# Patient Record
Sex: Female | Born: 1935
Health system: Southern US, Community
[De-identification: ages and names within clinical notes are randomized; demographics above are authoritative.]

## PROBLEM LIST (undated history)

## (undated) DIAGNOSIS — E785 Hyperlipidemia, unspecified: Secondary | ICD-10-CM

## (undated) DIAGNOSIS — I480 Paroxysmal atrial fibrillation: Secondary | ICD-10-CM

## (undated) DIAGNOSIS — Z7901 Long term (current) use of anticoagulants: Secondary | ICD-10-CM

## (undated) DIAGNOSIS — I779 Disorder of arteries and arterioles, unspecified: Secondary | ICD-10-CM

## (undated) DIAGNOSIS — I38 Endocarditis, valve unspecified: Secondary | ICD-10-CM

## (undated) DIAGNOSIS — Z8679 Personal history of other diseases of the circulatory system: Secondary | ICD-10-CM

## (undated) DIAGNOSIS — I10 Essential (primary) hypertension: Secondary | ICD-10-CM

## (undated) DIAGNOSIS — I739 Peripheral vascular disease, unspecified: Secondary | ICD-10-CM

## (undated) HISTORY — DX: Hyperlipidemia, unspecified: E78.5

## (undated) HISTORY — DX: Long term (current) use of anticoagulants: Z79.01

## (undated) HISTORY — DX: Paroxysmal atrial fibrillation: I48.0

## (undated) HISTORY — DX: Essential (primary) hypertension: I10

## (undated) HISTORY — DX: Personal history of other diseases of the circulatory system: Z86.79

## (undated) HISTORY — PX: OTHER SURGICAL HISTORY: SHX169

## (undated) HISTORY — DX: Peripheral vascular disease, unspecified: I73.9

## (undated) HISTORY — DX: Disorder of arteries and arterioles, unspecified: I77.9

## (undated) HISTORY — DX: Endocarditis, valve unspecified: I38

---

## 2002-09-20 ENCOUNTER — Other Ambulatory Visit: Admission: RE | Admit: 2002-09-20 | Discharge: 2002-09-20 | Payer: Self-pay | Admitting: Internal Medicine

## 2005-10-08 ENCOUNTER — Encounter: Payer: Self-pay | Admitting: Vascular Surgery

## 2005-10-08 ENCOUNTER — Ambulatory Visit (HOSPITAL_COMMUNITY): Admission: RE | Admit: 2005-10-08 | Discharge: 2005-10-08 | Payer: Self-pay | Admitting: Internal Medicine

## 2006-10-16 ENCOUNTER — Ambulatory Visit: Payer: Self-pay | Admitting: *Deleted

## 2006-10-16 ENCOUNTER — Ambulatory Visit (HOSPITAL_COMMUNITY): Admission: RE | Admit: 2006-10-16 | Discharge: 2006-10-16 | Payer: Self-pay | Admitting: Internal Medicine

## 2009-03-23 ENCOUNTER — Ambulatory Visit: Payer: Self-pay | Admitting: Vascular Surgery

## 2009-03-23 ENCOUNTER — Encounter: Payer: Self-pay | Admitting: Cardiology

## 2009-03-23 ENCOUNTER — Ambulatory Visit (HOSPITAL_COMMUNITY): Admission: RE | Admit: 2009-03-23 | Discharge: 2009-03-23 | Payer: Self-pay | Admitting: Cardiology

## 2010-02-20 ENCOUNTER — Ambulatory Visit: Payer: Self-pay | Admitting: Cardiology

## 2010-09-07 ENCOUNTER — Telehealth: Payer: Self-pay | Admitting: Cardiology

## 2010-09-07 NOTE — Telephone Encounter (Signed)
Received Auth to Release Med Rec Info today and request for Last OV and Last Carotid ULS,  Faxed over last OV however no ULS of Carotid done at [facility] but at [facility], added this info to fax cover sheet to let [facility] be aware, confirmation received

## 2011-03-22 ENCOUNTER — Encounter: Payer: Self-pay | Admitting: *Deleted

## 2011-03-22 DIAGNOSIS — I999 Unspecified disorder of circulatory system: Secondary | ICD-10-CM | POA: Insufficient documentation

## 2011-03-26 ENCOUNTER — Encounter: Payer: Self-pay | Admitting: Cardiology

## 2011-03-26 ENCOUNTER — Ambulatory Visit (INDEPENDENT_AMBULATORY_CARE_PROVIDER_SITE_OTHER): Payer: Medicare Other | Admitting: Cardiology

## 2011-03-26 VITALS — BP 92/62 | HR 60 | Ht 62.0 in | Wt 135.0 lb

## 2011-03-26 DIAGNOSIS — B0229 Other postherpetic nervous system involvement: Secondary | ICD-10-CM

## 2011-03-26 DIAGNOSIS — I779 Disorder of arteries and arterioles, unspecified: Secondary | ICD-10-CM

## 2011-03-26 DIAGNOSIS — E7800 Pure hypercholesterolemia, unspecified: Secondary | ICD-10-CM

## 2011-03-26 DIAGNOSIS — I999 Unspecified disorder of circulatory system: Secondary | ICD-10-CM

## 2011-03-26 DIAGNOSIS — I2510 Atherosclerotic heart disease of native coronary artery without angina pectoris: Secondary | ICD-10-CM

## 2011-03-26 NOTE — Assessment & Plan Note (Signed)
The patient had carotid Dopplers in February 2011 which showed moderate but not severe artery stenosis.  The patient denies any symptoms of TIA.

## 2011-03-26 NOTE — Progress Notes (Signed)
Mary Knapp Date of Birth:  10/27/1926 [facility] [address] [place], [place]  [address] [phone]  Fax   [phone]  HPI: This pleasant 78 year old woman is seen for a one-year followup office visit.  He has been feeling well.  She denies any cardiac symptoms.  She denies any chest pain or shortness of breath.  She does have known moderate carotid artery disease but has had no symptoms.  Current Outpatient Prescriptions  Medication Sig Dispense Refill  . aspirin 81 MG tablet Take 81 mg by mouth daily.      . Calcium Carbonate-Vitamin D (CALCIUM + D PO) Take by mouth 2 (two) times daily.      . Cholecalciferol (VITAMIN D PO) Take by mouth.      . Coenzyme Q10 (CO Q 10 PO) Take by mouth daily.      . Multiple Vitamin (MULTIVITAMIN) tablet Take 1 tablet by mouth daily.      . Nutritional Supplements (MELATONIN PO) Take 3 mg by mouth. Takes 1/2 at HS      . Omega-3 Fatty Acids (FISH OIL PO) Take by mouth daily.      . benazepril-hydrochlorthiazide (LOTENSIN HCT) 10-12.5 MG per tablet       . CRESTOR 10 MG tablet         No Known Allergies  Patient Active Problem List  Diagnoses  . Vascular disease  . Carotid artery disease    History  Smoking status  . Former Smoker  . Types: Cigarettes  Smokeless tobacco  . Not on file    History  Alcohol Use     Family History  Problem Relation Age of Onset  . Heart disease Father     Review of Systems: The patient denies any heat or cold intolerance.  No weight gain or weight loss.  The patient denies headaches or blurry vision.  There is no cough or sputum production.  The patient denies dizziness.  There is no hematuria or hematochezia.  The patient denies any muscle aches or arthritis.  The patient denies any rash.  The patient denies frequent falling or instability.  There is no history of depression or anxiety.  All other systems were reviewed and are negative.   Physical Exam: Filed Vitals:     03/26/11 1422  BP: 92/62  Pulse: 60   on examination this is a well-developed well-nourished elderly woman in no distress.Pupils equal and reactive.   Extraocular Movements are full.  There is no scleral icterus.  The mouth and pharynx are normal.  The neck is supple.  The carotids reveal no bruits.  The jugular venous pressure is normal.  The thyroid is not enlarged.  There is no lymphadenopathy.  The chest is clear to percussion and auscultation. There are no rales or rhonchi. Expansion of the chest is symmetrical.  The precordium is quiet.  The first heart sound is normal.  The second heart sound is physiologically split.  There is no murmur gallop rub or click.  There is no abnormal lift or heave.  LabThe abdomen is soft and nontender. Bowel sounds are normal. The liver and spleen are not enlarged. There Are no abdominal masses. There are no bruits.  The pedal pulses are good.  There is no phlebitis or edema.  There is no cyanosis or clubbing.  EKG shows normal sinus rhythm and is within normal limits.    Assessment / Plan: The patient is doing well on current therapy.  Continue same medications and be rechecked in one year.

## 2011-03-26 NOTE — Assessment & Plan Note (Signed)
The patient has a history of having had a normal nuclear stress test in 2007 in our office.  There was no ischemia and her ejection fraction was 59%.

## 2012-03-23 ENCOUNTER — Encounter: Payer: Self-pay | Admitting: Cardiology

## 2012-11-18 ENCOUNTER — Ambulatory Visit (INDEPENDENT_AMBULATORY_CARE_PROVIDER_SITE_OTHER): Payer: Medicare Other | Admitting: Nurse Practitioner

## 2012-11-18 ENCOUNTER — Encounter: Payer: Self-pay | Admitting: Internal Medicine

## 2012-11-18 VITALS — BP 143/80 | HR 71 | Resp 16

## 2012-11-18 DIAGNOSIS — R0789 Other chest pain: Secondary | ICD-10-CM

## 2012-11-18 NOTE — Progress Notes (Signed)
Patient presents as a walk-in with c/o chest pressure and low heart rate.  I retrieved patient from the lobby; patient ambulates without difficulty, is in no acute distress, skin warm, dry, acyanotic.  Patient speaks clearly in complete sentences and is alert and oriented to person, place, time.  Patient was taken to an exam room where vital signs and a 12 lead EKG was obtained.  Patient states that she had chest pressure on Thursday, Friday, and Saturday and that it is gone today.  She states that what was most concerning to her was that her heart rate was in the 50's and it is usually in the 70's.  EKG was taken to Dr. Johney Frame, DOD who states patient is having PACs and this is likely the cause of her chest pressure.  He advised that if she is not in distress today to make an appointment with Dr. Patty Sermons to be seen within the next week.  Patient is scheduled for Monday 10/27 at 0845 with Dr. Patty Sermons.  I advised patient that her pulse is actually higher than 50 but is difficult to obtain due to the PACs; patient verbalized understanding.  I advised patient to call 911 if she experiences chest pressure with dizziness, SOB, nausea, and/or diaphoresis and/or she cannot do her regular daily activities.  Patient verbalized understanding and called her daughter who is a Engineer, civil (consulting) who lives in [place] so that I could tell her daughter the same instructions.  Daughter verbalized gratitude.  Patient was escorted to the lobby for discharge in no acute distress.

## 2012-11-23 ENCOUNTER — Telehealth: Payer: Self-pay | Admitting: Cardiology

## 2012-11-23 ENCOUNTER — Ambulatory Visit (INDEPENDENT_AMBULATORY_CARE_PROVIDER_SITE_OTHER): Payer: Medicare Other | Admitting: Cardiology

## 2012-11-23 ENCOUNTER — Encounter: Payer: Self-pay | Admitting: Cardiology

## 2012-11-23 VITALS — BP 120/70 | HR 120 | Ht 61.0 in | Wt 134.0 lb

## 2012-11-23 DIAGNOSIS — E7800 Pure hypercholesterolemia, unspecified: Secondary | ICD-10-CM

## 2012-11-23 DIAGNOSIS — I4892 Unspecified atrial flutter: Secondary | ICD-10-CM

## 2012-11-23 DIAGNOSIS — I779 Disorder of arteries and arterioles, unspecified: Secondary | ICD-10-CM

## 2012-11-23 LAB — CBC WITH DIFFERENTIAL/PLATELET
Basophils Relative: 0.7 % (ref 0.0–3.0)
Eosinophils Absolute: 0.1 10*3/uL (ref 0.0–0.7)
Eosinophils Relative: 1.8 % (ref 0.0–5.0)
HCT: 40.8 % (ref 36.0–46.0)
Lymphs Abs: 1.7 10*3/uL (ref 0.7–4.0)
MCHC: 33.7 g/dL (ref 30.0–36.0)
MCV: 91.6 fl (ref 78.0–100.0)
Monocytes Absolute: 0.9 10*3/uL (ref 0.1–1.0)
Neutro Abs: 3.6 10*3/uL (ref 1.4–7.7)
Neutrophils Relative %: 57 % (ref 43.0–77.0)
RBC: 4.46 Mil/uL (ref 3.87–5.11)
WBC: 6.2 10*3/uL (ref 4.5–10.5)

## 2012-11-23 LAB — BASIC METABOLIC PANEL
CO2: 32 mEq/L (ref 19–32)
Chloride: 95 mEq/L — ABNORMAL LOW (ref 96–112)
Creatinine, Ser: 0.9 mg/dL (ref 0.4–1.2)
Potassium: 4.1 mEq/L (ref 3.5–5.1)

## 2012-11-23 LAB — T4, FREE: Free T4: 0.95 ng/dL (ref 0.60–1.60)

## 2012-11-23 MED ORDER — METOPROLOL SUCCINATE ER 25 MG PO TB24: 25.0000 mg | ORAL_TABLET | Freq: Every day | ORAL | Status: DC

## 2012-11-23 MED ORDER — RIVAROXABAN 20 MG PO TABS: 20.0000 mg | ORAL_TABLET | Freq: Every day | ORAL | Status: DC

## 2012-11-23 NOTE — Assessment & Plan Note (Signed)
The patient had carotid Dopplers in February 2011 which showed moderate but not severe carotid artery stenosis.  She has not had any TIA symptoms.

## 2012-11-23 NOTE — Progress Notes (Signed)
Quick Note:  Please report to patient. The recent labs are stable. Continue same medication and careful diet. Kidney function normal. No anemia. Thyroid normal. ______

## 2012-11-23 NOTE — Assessment & Plan Note (Signed)
EKG today shows heart rate of 120 and she has atrial flutter with 2:1 block on EKG.  She has not had any thromboembolic episodes.  She has a chance has score of 4, for age, sex, and vascular disease (carotid artery stenosis).  We will start her on Xarelto.  We will obtain a two-dimensional echocardiogram.

## 2012-11-23 NOTE — Telephone Encounter (Signed)
Follow up    [facility] w/[employer] called back about pre auth

## 2012-11-23 NOTE — Patient Instructions (Signed)
Will obtain labs today and call you with the results (bmet,t72f,tsh,cbc)  STOP ASPIRIN  START XARELTO 20 MG DAILY  START TOPROL XL 25 MG DAILY  Your physician has requested that you have an echocardiogram. Echocardiography is a painless test that uses sound waves to create images of your heart. It provides your doctor with information about the size and shape of your heart and how well your heart's chambers and valves are working. This procedure takes approximately one hour. There are no restrictions for this procedure.  Your physician recommends that you schedule a follow-up appointment in: 1 WEEK OV/EKG  Atrial Flutter Atrial flutter is a heart rhythm that can cause the heart to beat very fast (tachycardia). It originates in the upper chambers of the heart (atria). In atrial flutter, the top chambers of the heart (atria) often beat much faster than the bottom chambers of the heart (ventricles). Atrial flutter has a regular "saw toothed" appearance in an EKG readout. An EKG is a test that records the electrical activity of the heart. Atrial flutter can cause the heart to beat up to 150 beats per minute (BPM). Atrial flutter can either be short lived (paroxysmal) or permanent.  CAUSES  Causes of atrial flutter can be many. Some of these include:  Heart related issues:  Heart attack (myocardial infarction).  Heart failure.  Heart valve problems.  Poorly controlled high blood pressure (hypertension).  Afteropen heart surgery.  Lung related issues:  A blood clot in the lungs (pulmonary embolism).  Chronic obstructive pulmonary disease (COPD). Medications used to treat COPD can attribute to atrial flutter.  Other related causes:  Hyperthyroidism.  Caffeine.  Some decongestant cold medications.  Low electrolyte levels such as potassium or magnesium.  Cocaine. SYMPTOMS  An awareness of your heart beating rapidly (palpitations).  Shortness of breath.  Chest pain.  Low  blood pressure (hypotension).  Dizziness or fainting. DIAGNOSIS  Different tests can be performed to diagnose atrial flutter.   An EKG.  Holter monitor. This is a 24 hour recording of your heart rhythm. You will also be given a diary. Write down all symptoms that you have and what you were doing at the time you experienced symptoms.  Cardiac event monitor. This small device can be worn for up to 30 days. When you have heart symptoms, you will push a button on the device. This will then record your heart rhythm.  Echocardiogram. This is an imaging test to look at your heart. Your caregiver will look at your heart valves and the ventricles.  Stress Test. This test can help determine if the atrial flutter is related to exercise or if coronary artery disease is present.  Laboratory studies will look at certain blood levels like:  Complete blood count (CBC).  Potassium.  Magnesium.  Thyroid function. TREATMENT  Treatment of atrial flutter varies. A combination of therapies may be used or sometimes atrial flutter may need only 1 type of treatment.  Lab work: If your blood work, such as your electrolytes (potassium, magnesium) or your thyroid function tests are abnormal, your caregiver will treat them accordingly.  Medication:  There are several different types of medications that can convert your heart to a normal rhythm and prevent atrial flutter from reoccurring.  Nonsurgical procedures: Nonsurgical techniques may be used to control atrial flutter. Some examples include:  Cardioversion. This technique uses either drugs or an electrical shock to restore a normal heart rhythm:  Cardioversion drugs may be given through an intravenous (IV) line  to help "reset" the heart rhythm.  In electrical cardioversion, your caregiver shocks your heart with electrical energy. This helps to reset the heartbeat to a normal rhythm.  Ablation. If atrial flutter is a persistent problem, an ablation may  be needed. This procedure is done under mild sedation. High frequency radio-wave energy is used to destroy the area of heart tissue responsible for atrial flutter. SEEK IMMEDIATE MEDICAL CARE IF:   Dizziness.  Near fainting or fainting.  Shortness of breath.  Chest pain or pressure.  Sudden nausea or vomiting.  Profuse sweating. If you have the above symptoms, call your local emergency service immediately! Do not drive yourself to the hospital. MAKE SURE YOU:   Understand these instructions.  Will watch your condition.  Will get help right away if you are not doing well or get worse. Document Released: 06/02/2008 Document Revised: 04/08/2011 Document Reviewed: 06/02/2008 [facility] Patient Information 2014 [place], [place].

## 2012-11-23 NOTE — Progress Notes (Signed)
Mary Knapp Date of Birth:  17-Oct-1926 [address] [place], [place]  [address] [phone]  Fax   [phone]  HPI: This pleasant 79 year old woman is seen for a  Work in visit after a long absence.  She has been feeling well.  Last week she came in as a walk-in and was checked by the nurse.  Her EKG at that time showed normal sinus rhythm with occasional PVC .her blood pressure monitor at home had been reading a slow pulse which was found to be secondary to PVCs not being picked up by the machine.  She one week ago she had some chest discomfort characterized as pressure one day and has had none since.  She has not been aware of her heart racing or beating fast.  She has had some malaise and fatigue.  She does not have any history of ischemic heart disease.  She had a normal Myoview stress test in 2007 with no ischemia and her ejection fraction was 59%.  She denies any chest pain or shortness of breath.  She does have known moderate carotid artery disease but has had no symptoms.  She does not have any history of GI bleeding.  She had a annual physical with Dr. Valentina Lucks about 4 weeks ago and states that everything checked out fine at that time.  She has not been having any symptoms of weight loss or thyrotoxicosis.  She drinks only one cup of coffee a day.  She does not drink alcohol.  She is a nonsmoker.  Current Outpatient Prescriptions  Medication Sig Dispense Refill  . benazepril-hydrochlorthiazide (LOTENSIN HCT) 10-12.5 MG per tablet       . Calcium Carbonate-Vitamin D (CALCIUM + D PO) Take by mouth 2 (two) times daily.      . Cholecalciferol (VITAMIN D PO) Take by mouth.      . Coenzyme Q10 (CO Q 10 PO) Take by mouth daily.      . CRESTOR 10 MG tablet       . Multiple Vitamin (MULTIVITAMIN) tablet Take 1 tablet by mouth daily.      . Nutritional Supplements (MELATONIN PO) Take 3 mg by mouth. Takes 1/2 at HS      . Omega-3 Fatty Acids (FISH OIL PO) Take by mouth daily.       . metoprolol succinate (TOPROL XL) 25 MG 24 hr tablet Take 1 tablet (25 mg total) by mouth daily.  30 tablet  5  . Rivaroxaban (XARELTO) 20 MG TABS tablet Take 1 tablet (20 mg total) by mouth daily.  30 tablet  3   No current facility-administered medications for this visit.    No Known Allergies  Patient Active Problem List   Diagnosis Date Noted  . Atrial flutter 11/23/2012  . Vascular disease   . Carotid artery disease     History  Smoking status  . Former Smoker  . Types: Cigarettes  Smokeless tobacco  . Not on file    History  Alcohol Use     Family History  Problem Relation Age of Onset  . Heart disease Father     Review of Systems: The patient denies any heat or cold intolerance.  No weight gain or weight loss.  The patient denies headaches or blurry vision.  There is no cough or sputum production.  The patient denies dizziness.  There is no hematuria or hematochezia.  The patient denies any muscle aches or arthritis.  The patient denies  any rash.  The patient denies frequent falling or instability.  There is no history of depression or anxiety.  All other systems were reviewed and are negative.   Physical Exam: Filed Vitals:   11/23/12 0854  BP: 120/70  Pulse: 120   on examination this is a well-developed well-nourished elderly woman in no distress.Pupils equal and reactive.   Extraocular Movements are full.  There is no scleral icterus.  The mouth and pharynx are normal.  The neck is supple.  The carotids reveal no bruits.  The jugular venous pressure is normal.  The thyroid is not enlarged.  There is no lymphadenopathy.  The chest is clear to percussion and auscultation. There are no rales or rhonchi. Expansion of the chest is symmetrical.  The precordium is quiet.  The first heart sound is normal.  The second heart sound is physiologically split.  There is no murmur gallop rub or click.  There is no abnormal lift or heave.  LabThe abdomen is soft and  nontender. Bowel sounds are normal. The liver and spleen are not enlarged. There Are no abdominal masses. There are no bruits.  The pedal pulses are good.  There is no phlebitis or edema.  There is no cyanosis or clubbing.  EKG demonstrates atrial flutter with 2 to one block.  Occasional PVCs there are nonspecific ST-T wave changes present    Assessment / Plan: We will treat her atrial flutter with the addition of Toprol 25 mg one daily.  We will add Xarelto 20 mg one daily.  She has a chance to ask score of 4.  She is concerned about easy bruising and we will stop her baby aspirin as we start Xarelto.  We are checking baseline renal function today as well as thyroid function studies and hemoglobin.  We will have her return soon for a 2-D echo.  She will return week for office visit and followup EKG.

## 2012-11-24 ENCOUNTER — Telehealth: Payer: Self-pay | Admitting: *Deleted

## 2012-11-24 NOTE — Telephone Encounter (Signed)
Pharmacy review for decision on payment for xarelto per [employer]

## 2012-11-24 NOTE — Telephone Encounter (Signed)
Advised patient of lab results  

## 2012-11-24 NOTE — Telephone Encounter (Signed)
xarelto not approved through [employer] [facility] with dx of atrial flutter. I notified Dr Patty Sermons.

## 2012-11-24 NOTE — Telephone Encounter (Signed)
Message copied by Burnell Blanks on Tue Nov 24, 2012 12:21 PM ------      Message from: Cassell Clement      Created: Mon Nov 23, 2012  8:23 PM       Please report to patient.  The recent labs are stable. Continue same medication and careful diet. Kidney function normal. No anemia. Thyroid normal. ------

## 2012-11-24 NOTE — Telephone Encounter (Signed)
Message copied by Burnell Blanks on Tue Nov 24, 2012 12:20 PM ------      Message from: Cassell Clement      Created: Mon Nov 23, 2012  8:23 PM       Please report to patient.  The recent labs are stable. Continue same medication and careful diet. Kidney function normal. No anemia. Thyroid normal. ------

## 2012-11-30 ENCOUNTER — Ambulatory Visit (INDEPENDENT_AMBULATORY_CARE_PROVIDER_SITE_OTHER): Payer: Medicare Other | Admitting: Cardiology

## 2012-11-30 ENCOUNTER — Encounter: Payer: Self-pay | Admitting: Cardiology

## 2012-11-30 VITALS — BP 92/50 | HR 56 | Ht 61.0 in | Wt 134.8 lb

## 2012-11-30 DIAGNOSIS — R0989 Other specified symptoms and signs involving the circulatory and respiratory systems: Secondary | ICD-10-CM

## 2012-11-30 DIAGNOSIS — I779 Disorder of arteries and arterioles, unspecified: Secondary | ICD-10-CM

## 2012-11-30 DIAGNOSIS — I119 Hypertensive heart disease without heart failure: Secondary | ICD-10-CM | POA: Insufficient documentation

## 2012-11-30 DIAGNOSIS — I4892 Unspecified atrial flutter: Secondary | ICD-10-CM

## 2012-11-30 DIAGNOSIS — R0609 Other forms of dyspnea: Secondary | ICD-10-CM

## 2012-11-30 NOTE — Patient Instructions (Signed)
DECREASE BENAZEPRIL TO 1/2 TABLET DAILY  Your physician recommends that you schedule a follow-up appointment in: 1 MONTH OV/EKG  RETURN SOON FOR ON EVENT MONITOR

## 2012-11-30 NOTE — Assessment & Plan Note (Signed)
She has not been aware of any racing of her heart today.  She states that yesterday her heart rate was 120 again.  We will have her wear an 30 day event monitor to determine the frequency of her tachycardia.  We also want to see whether she is having any episodes of paroxysmal atrial fibrillation.  She has been on Xarelto since last week.  Today she is in normal sinus rhythm with occasional PVCs.  She has not had any side effects from the Xarelto.

## 2012-11-30 NOTE — Assessment & Plan Note (Signed)
Since the addition of her beta blocker for her atrial flutter, her blood pressure has been low.  Today it is 92/50.  We will reduce her benazepril/HCTZ to just half a tablet daily.

## 2012-11-30 NOTE — Progress Notes (Signed)
Mary Knapp Date of Birth:  Jul 22, 1926 [address] [place], [place]  [address] [phone]  Fax   [phone]  HPI: This pleasant 79 year old woman is seen for a followup office visit.  We had seen her on 11/23/12 after a long absence.  She was seen at that time because of supraventricular tachycardia which appeared to be atrial flutter with 2 to one block on EKG.   The previous week she had come in as a walk-in and was checked by the nurse.  Her EKG at that time showed normal sinus rhythm with occasional PVC .her blood pressure monitor at home had been reading a slow pulse which was found to be secondary to PVCs not being picked up by the machine.  She one week ago she had some chest discomfort characterized as pressure one day and has had none since.  She has not been aware of her heart racing or beating fast.  She has had some malaise and fatigue.  She does not have any history of ischemic heart disease.  She had a normal Myoview stress test in 2007 with no ischemia and her ejection fraction was 59%.  She denies any chest pain or shortness of breath.  She does have known moderate carotid artery disease but has had no symptoms.  She does not have any history of GI bleeding.  She had a annual physical with Dr. Valentina Lucks about 4 weeks ago and states that everything checked out fine at that time.  She has not been having any symptoms of weight loss or thyrotoxicosis.  She drinks only one cup of coffee a day.  She does not drink alcohol.  She is a nonsmoker. At her last visit she was started on Toprol XL 25 mg daily and on Xarelto 20 mg daily  Current Outpatient Prescriptions  Medication Sig Dispense Refill  . benazepril-hydrochlorthiazide (LOTENSIN HCT) 10-12.5 MG per tablet as directed. 1/2 TABLET DAILY      . Calcium Carbonate-Vitamin D (CALCIUM + D PO) Take by mouth 2 (two) times daily.      . Cholecalciferol (VITAMIN D PO) Take by mouth.      . Coenzyme Q10 (CO Q 10 PO)  Take by mouth daily.      . CRESTOR 10 MG tablet       . metoprolol succinate (TOPROL XL) 25 MG 24 hr tablet Take 1 tablet (25 mg total) by mouth daily.  30 tablet  5  . Multiple Vitamin (MULTIVITAMIN) tablet Take 1 tablet by mouth daily.      . Nutritional Supplements (MELATONIN PO) Take 3 mg by mouth. Takes 1/2 at HS      . Omega-3 Fatty Acids (FISH OIL PO) Take by mouth daily.      . Rivaroxaban (XARELTO) 20 MG TABS tablet Take 1 tablet (20 mg total) by mouth daily.  30 tablet  3   No current facility-administered medications for this visit.    No Known Allergies  Patient Active Problem List   Diagnosis Date Noted  . Atrial flutter 11/23/2012  . Vascular disease   . Carotid artery disease     History  Smoking status  . Former Smoker  . Types: Cigarettes  Smokeless tobacco  . Not on file    History  Alcohol Use     Family History  Problem Relation Age of Onset  . Heart disease Father     Review of Systems: The patient denies any heat  or cold intolerance.  No weight gain or weight loss.  The patient denies headaches or blurry vision.  There is no cough or sputum production.  The patient denies dizziness.  There is no hematuria or hematochezia.  The patient denies any muscle aches or arthritis.  The patient denies any rash.  The patient denies frequent falling or instability.  There is no history of depression or anxiety.  All other systems were reviewed and are negative.   Physical Exam: Filed Vitals:   11/30/12 1515  BP: 92/50  Pulse: 56   on examination this is a well-developed well-nourished elderly woman in no distress.Pupils equal and reactive.   Extraocular Movements are full.  There is no scleral icterus.  The mouth and pharynx are normal.  The neck is supple.  The carotids reveal no bruits.  The jugular venous pressure is normal.  The thyroid is not enlarged.  There is no lymphadenopathy.  The chest is clear to percussion and auscultation. There are no rales  or rhonchi. Expansion of the chest is symmetrical.  The precordium is quiet.  The first heart sound is normal.  The second heart sound is physiologically split.  There is no murmur gallop rub or click.  There is no abnormal lift or heave.  LabThe abdomen is soft and nontender. Bowel sounds are normal. The liver and spleen are not enlarged. There Are no abdominal masses. There are no bruits.  The pedal pulses are good.  There is no phlebitis or edema.  There is no cyanosis or clubbing.  EKG today shows sinus bradycardia at 58 per minute with occasional PVC.  No ischemic changes.    Assessment / Plan: 1. paroxysmal atrial flutter.  Chadsvasc score of 4.  Continue Toprol and Xarelto.  Get a 30 day monitor to determine frequency of arrhythmia and to look for atrial fibrillation. 2. prior essential hypertension, now low blood pressure.  We will decrease the benazepril HCT to half tablet daily She will be getting her echocardiogram tomorrow Return in one month for followup office visit and EKG.

## 2012-11-30 NOTE — Assessment & Plan Note (Signed)
The patient has not been having any TIA symptoms. 

## 2012-12-01 ENCOUNTER — Other Ambulatory Visit: Payer: Self-pay

## 2012-12-01 ENCOUNTER — Ambulatory Visit (HOSPITAL_COMMUNITY): Payer: Medicare Other | Attending: Cardiology | Admitting: Radiology

## 2012-12-01 DIAGNOSIS — I080 Rheumatic disorders of both mitral and aortic valves: Secondary | ICD-10-CM | POA: Insufficient documentation

## 2012-12-01 DIAGNOSIS — Z0000 Encounter for general adult medical examination without abnormal findings: Secondary | ICD-10-CM

## 2012-12-01 DIAGNOSIS — I079 Rheumatic tricuspid valve disease, unspecified: Secondary | ICD-10-CM | POA: Insufficient documentation

## 2012-12-01 DIAGNOSIS — Z87891 Personal history of nicotine dependence: Secondary | ICD-10-CM | POA: Insufficient documentation

## 2012-12-01 DIAGNOSIS — Z23 Encounter for immunization: Secondary | ICD-10-CM

## 2012-12-01 DIAGNOSIS — I4892 Unspecified atrial flutter: Secondary | ICD-10-CM

## 2012-12-01 DIAGNOSIS — I779 Disorder of arteries and arterioles, unspecified: Secondary | ICD-10-CM | POA: Insufficient documentation

## 2012-12-01 NOTE — Progress Notes (Signed)
Echocardiogram performed.  

## 2012-12-02 ENCOUNTER — Encounter: Payer: Self-pay | Admitting: *Deleted

## 2012-12-02 ENCOUNTER — Encounter (INDEPENDENT_AMBULATORY_CARE_PROVIDER_SITE_OTHER): Payer: Medicare Other

## 2012-12-02 ENCOUNTER — Telehealth: Payer: Self-pay | Admitting: *Deleted

## 2012-12-02 DIAGNOSIS — I4892 Unspecified atrial flutter: Secondary | ICD-10-CM

## 2012-12-02 NOTE — Progress Notes (Signed)
Patient ID: Mary Knapp, female   DOB: [birth year], 79 y.o.   MRN: [id number] [employer] [facility] 30 day cardiac event monitor applied to patient.

## 2012-12-02 NOTE — Telephone Encounter (Signed)
Patient called requesting Xarelto samples. Patient aware samples will be left at front desk for pick up.

## 2012-12-02 NOTE — Telephone Encounter (Signed)
Message copied by Burnell Blanks on Wed Dec 02, 2012  6:11 PM ------      Message from: Cassell Clement      Created: Tue Dec 01, 2012  5:56 PM       Please report. Echo shows normal LV systolic function. There is moderate mitral regurgitation. The right and left atria are enlarged. This is probably the cause of her atrial flutter episodes. Continue same meds. ------

## 2012-12-02 NOTE — Telephone Encounter (Signed)
Advised patient

## 2012-12-03 ENCOUNTER — Telehealth: Payer: Self-pay | Admitting: *Deleted

## 2012-12-03 NOTE — Telephone Encounter (Signed)
Xarelto denied by insurance due to dx of atrial flutter stating "the use is not supported by the FDA or one of the medicare approval references for treating the medical condition".

## 2012-12-04 ENCOUNTER — Telehealth: Payer: Self-pay | Admitting: Cardiology

## 2012-12-04 NOTE — Telephone Encounter (Signed)
PT  AWARE OF ECHO RESULTS   AND  RESULTS  WERE  RELEASED SO PT MAY  SEE  ON  MY CHART WILL  CALL IF NOT  ABLE TO VIEW .Zack Seal

## 2012-12-04 NOTE — Telephone Encounter (Signed)
New Problem:  Pt states she is trying to access her Echo report via her MyChart account. Pt states her daughter is in [place] at the moment and the pt gave her daughter her Mychart information... The pt was wanting her daughter to review the information... However, the pt states her daughter said the echo report was not on there. Please advise how the daughter can view the report.

## 2012-12-04 NOTE — Telephone Encounter (Signed)
We will try to keep her in samples for the time being.

## 2012-12-08 ENCOUNTER — Telehealth: Payer: Self-pay | Admitting: Cardiology

## 2012-12-08 ENCOUNTER — Encounter: Payer: Self-pay | Admitting: *Deleted

## 2012-12-08 NOTE — Telephone Encounter (Signed)
New Problem:  Pt states she wants to access her echo via mychart. Pt states she has a copy of her report she picked up from our office but she still cannot pull it up on MyChart. Pt would like help with MyChart.

## 2012-12-08 NOTE — Telephone Encounter (Addendum)
Spoke with pt, aware report will not show in my chart. Questions regarding echo answered.

## 2012-12-14 ENCOUNTER — Telehealth: Payer: Self-pay | Admitting: *Deleted

## 2012-12-14 NOTE — Telephone Encounter (Signed)
Patient came to office today and would like to know if she could take the monitor off when she goes to bed at night. Mary Knapp has decided not to wear the heart monitor on thanksgiving. She has family coming in from [place].  Please call patient at [phone].

## 2012-12-14 NOTE — Telephone Encounter (Signed)
Left message for patient to call office and ask for triage 

## 2012-12-14 NOTE — Telephone Encounter (Signed)
Spoke with patient who states she wants to take heart monitor off at night.  I advised patient that she needs to wear monitor at all times so that we can observe patient's heart rhythm.  Patient states she is not sleeping well and that the monitor leads are getting in her way.  I advised patient that part of the purpose of the monitor is observation while she is sleeping.  Patient verbalized understanding.  I advised that I will send message to Andee Lineman, monitor technician for further advice regarding how to sleep with the monitor.  Patient states she would like to take the monitor off during Thanksgiving day.  I advised patient to continue to wear the monitor at all times if at all possible.  Patient verbalized understanding. I also gave patient the number for [employer] for help with Xarelto cost.

## 2012-12-28 ENCOUNTER — Telehealth: Payer: Self-pay | Admitting: Cardiology

## 2012-12-28 NOTE — Telephone Encounter (Signed)
Advised patient to wear monitor full 30 days and keep follow up appointment. Patient verbalized understanding.

## 2012-12-28 NOTE — Telephone Encounter (Signed)
New Problem:  Pt is asking if she needs to wear her 30 day monitor til 12/4 or 12/5. Pt states she was told to mail it back on the 12/5. She did not know if she needed to wear it until she mailed it. Also, pt wants to know if Dr. Patty Sermons will have the results by the time of her appt on 12/12. Pt wants to hear the results in person. Please call pt back

## 2012-12-29 ENCOUNTER — Ambulatory Visit: Payer: Medicare Other | Admitting: Cardiology

## 2013-01-08 ENCOUNTER — Ambulatory Visit (INDEPENDENT_AMBULATORY_CARE_PROVIDER_SITE_OTHER): Payer: Medicare Other | Admitting: Cardiology

## 2013-01-08 ENCOUNTER — Encounter: Payer: Self-pay | Admitting: Cardiology

## 2013-01-08 VITALS — BP 126/61 | HR 53 | Ht 61.5 in | Wt 134.8 lb

## 2013-01-08 DIAGNOSIS — I480 Paroxysmal atrial fibrillation: Secondary | ICD-10-CM | POA: Insufficient documentation

## 2013-01-08 DIAGNOSIS — E7800 Pure hypercholesterolemia, unspecified: Secondary | ICD-10-CM

## 2013-01-08 DIAGNOSIS — I4891 Unspecified atrial fibrillation: Secondary | ICD-10-CM

## 2013-01-08 DIAGNOSIS — I119 Hypertensive heart disease without heart failure: Secondary | ICD-10-CM

## 2013-01-08 DIAGNOSIS — I779 Disorder of arteries and arterioles, unspecified: Secondary | ICD-10-CM

## 2013-01-08 NOTE — Assessment & Plan Note (Signed)
The patient has not been experiencing any TIA symptoms. 

## 2013-01-08 NOTE — Progress Notes (Signed)
Mary Knapp Date of Birth:  [birth year] [address] [place], [place]  [address] [phone]  Fax   [phone]  HPI: This pleasant 79 year old woman is seen for a followup office visit.  We had seen her on 11/23/12 after a long absence.  She was seen at that time because of supraventricular tachycardia which appeared to be atrial flutter with 2 to one block on EKG.   The previous week she had come in as a walk-in and was checked by the nurse.  Her EKG at that time showed normal sinus rhythm with occasional PVC .her blood pressure monitor at home had been reading a slow pulse which was found to be secondary to PVCs not being picked up by the machine.  She one week ago she had some chest discomfort characterized as pressure one day and has had none since.  She has not been aware of her heart racing or beating fast.  She has had some malaise and fatigue.  She does not have any history of ischemic heart disease.  She had a normal Myoview stress test in 2007 with no ischemia and her ejection fraction was 59%.  She denies any chest pain or shortness of breath.  She does have known moderate carotid artery disease but has had no symptoms.  She does not have any history of GI bleeding.  She had a annual physical with Dr. Valentina Lucks about 4 weeks ago and states that everything checked out fine at that time.  She has not been having any symptoms of weight loss or thyrotoxicosis.  She drinks only one cup of coffee a day.  She does not drink alcohol.  She is a nonsmoker. At her last visit she was started on Toprol XL 25 mg daily and on Xarelto 20 mg daily. Since last visit the patient had a 2-D echo on 12/01/12 which showed normal systolic function with an ejection fraction of 55-60%.  There was mild aortic insufficiency, moderate mitral regurgitation, and severe biatrial enlargement.  There was also moderately severe tricuspid regurgitation.  The patient also underwent a 30 day event monitor  which shows sinus bradycardia with occasional PVC and occasional PVCs and occasional short runs of paroxysmal atrial fibrillation.  Current Outpatient Prescriptions  Medication Sig Dispense Refill  . benazepril-hydrochlorthiazide (LOTENSIN HCT) 10-12.5 MG per tablet as directed. 1/2 TABLET DAILY      . Calcium Carbonate-Vitamin D (CALCIUM + D PO) Take by mouth 2 (two) times daily.      . Cholecalciferol (VITAMIN D PO) Take by mouth.      . Coenzyme Q10 (CO Q 10 PO) Take by mouth daily.      . CRESTOR 10 MG tablet       . metoprolol succinate (TOPROL XL) 25 MG 24 hr tablet Take 1 tablet (25 mg total) by mouth daily.  30 tablet  5  . Multiple Vitamin (MULTIVITAMIN) tablet Take 1 tablet by mouth daily.      . Nutritional Supplements (MELATONIN PO) Take 3 mg by mouth. Takes 1/2 at [facility]      . Omega-3 Fatty Acids (FISH OIL PO) Take by mouth daily.      . Rivaroxaban (XARELTO) 20 MG TABS tablet Take 1 tablet (20 mg total) by mouth daily.  30 tablet  3   No current facility-administered medications for this visit.    No Known Allergies  Patient Active Problem List   Diagnosis Date Noted  . PAF (  paroxysmal atrial fibrillation) 01/08/2013  . Pure hypercholesterolemia 01/08/2013  . Benign hypertensive heart disease without heart failure 11/30/2012  . Atrial flutter 11/23/2012  . Vascular disease   . Carotid artery disease     History  Smoking status  . Former Smoker  . Types: Cigarettes  Smokeless tobacco  . Not on file    History  Alcohol Use     Family History  Problem Relation Age of Onset  . Heart disease Father     Review of Systems: The patient denies any heat or cold intolerance.  No weight gain or weight loss.  The patient denies headaches or blurry vision.  There is no cough or sputum production.  The patient denies dizziness.  There is no hematuria or hematochezia.  The patient denies any muscle aches or arthritis.  The patient denies any rash.  The patient denies  frequent falling or instability.  There is no history of depression or anxiety.  All other systems were reviewed and are negative.   Physical Exam: Filed Vitals:   01/08/13 1500  BP: 126/61  Pulse: 53   on examination this is a well-developed well-nourished elderly woman in no distress.Pupils equal and reactive.   Extraocular Movements are full.  There is no scleral icterus.  The mouth and pharynx are normal.  The neck is supple.  The carotids reveal no bruits.  The jugular venous pressure is normal.  The thyroid is not enlarged.  There is no lymphadenopathy.  The chest is clear to percussion and auscultation. There are no rales or rhonchi. Expansion of the chest is symmetrical.  The precordium is quiet.  The first heart sound is normal.  The second heart sound is physiologically split.  There is no murmur gallop rub or click.  There is no abnormal lift or heave.  LabThe abdomen is soft and nontender. Bowel sounds are normal. The liver and spleen are not enlarged. There Are no abdominal masses. There are no bruits.  The pedal pulses are good.  There is no phlebitis or edema.  There is no cyanosis or clubbing.  EKG today shows sinus bradycardia and no ischemic changes.    Assessment / Plan: 1. paroxysmal atrial flutter-fibrillation.  Chadsvasc score of 4.  Continue Toprol and Xarelto.    2. prior essential hypertension, now normotensive on the lower dose of benazepril HCT 3. moderate mitral regurgitation 4. severe biatrial enlargement 5. severe tricuspid regurgitation by echo  Continue same medication.  Recheck in 3 months for office visit and EKG.

## 2013-01-08 NOTE — Assessment & Plan Note (Signed)
Blood pressure was remaining stable on current therapy 

## 2013-01-08 NOTE — Patient Instructions (Signed)
Your physician recommends that you continue on your current medications as directed. Please refer to the Current Medication list given to you today.  Your physician recommends that you schedule a follow-up appointment in: 3 month ov/ekg 

## 2013-01-08 NOTE — Assessment & Plan Note (Signed)
The patient has not been aware herself of any recurrent atrial fibrillation.  She is on Xarelto.  She has not had any bleeding problems.  She has very minor skin bruising

## 2013-01-15 ENCOUNTER — Telehealth: Payer: Self-pay | Admitting: Cardiology

## 2013-01-15 NOTE — Telephone Encounter (Signed)
Left message with patient

## 2013-01-15 NOTE — Telephone Encounter (Signed)
New Problem:  Pt is calling in reference to her Xeralto 20 mg.. Pt states her insurance was denied bc of her diagnosis. Pt states her insurance will only pay for certain diagnosis when prescribed xeralto. Pt would like to speak with melinda about her options.

## 2013-01-15 NOTE — Telephone Encounter (Signed)
Will call insurance company for prior authorization for Afib

## 2013-01-19 ENCOUNTER — Telehealth: Payer: Self-pay | Admitting: *Deleted

## 2013-01-19 NOTE — Telephone Encounter (Signed)
Rosana Hoes RN has started appeal process, patient aware

## 2013-01-19 NOTE — Telephone Encounter (Signed)
An appeal for the xarelto has been started by [employer] / [employer] due to dx addition of atrial fibrillation, we will not have an answer until 01/25/2013. I have informed patient I am in contact with her insurer working on approval for the [employer].

## 2013-02-04 ENCOUNTER — Telehealth: Payer: Self-pay | Admitting: *Deleted

## 2013-02-04 NOTE — Telephone Encounter (Signed)
Appeal approved for medication xarelto through 01/20/2014 [employer] Complete Member # [id number] Approval with authorization # [id number]  Dates 11/23/2012-01/20/2014

## 2013-02-05 NOTE — Telephone Encounter (Signed)
Patient aware.

## 2013-04-07 ENCOUNTER — Encounter: Payer: Self-pay | Admitting: Cardiology

## 2013-04-07 ENCOUNTER — Ambulatory Visit (INDEPENDENT_AMBULATORY_CARE_PROVIDER_SITE_OTHER): Payer: Medicare Other | Admitting: Cardiology

## 2013-04-07 VITALS — BP 154/78 | HR 51 | Ht 61.5 in | Wt 138.0 lb

## 2013-04-07 DIAGNOSIS — I119 Hypertensive heart disease without heart failure: Secondary | ICD-10-CM

## 2013-04-07 DIAGNOSIS — I4891 Unspecified atrial fibrillation: Secondary | ICD-10-CM

## 2013-04-07 DIAGNOSIS — Z79899 Other long term (current) drug therapy: Secondary | ICD-10-CM

## 2013-04-07 DIAGNOSIS — I480 Paroxysmal atrial fibrillation: Secondary | ICD-10-CM

## 2013-04-07 DIAGNOSIS — I999 Unspecified disorder of circulatory system: Secondary | ICD-10-CM

## 2013-04-07 NOTE — Patient Instructions (Signed)
Your physician recommends that you continue on your current medications as directed. Please refer to the Current Medication list given to you today.  Your physician recommends that you schedule a follow-up appointment in: 3 month ov/ekg/bmet/cbc

## 2013-04-07 NOTE — Assessment & Plan Note (Signed)
Blood pressure today is slightly higher.  She is not having any headaches or dizzy spells.  She is already bradycardic so we will not increase her beta blocker any further.  Continue current dose of ARB/HCTZ and continue to follow closely.

## 2013-04-07 NOTE — Assessment & Plan Note (Signed)
The patient has been told of mild plaque in her carotids in the past.  She has not been experiencing any TIA symptoms.

## 2013-04-07 NOTE — Assessment & Plan Note (Signed)
She has not been aware of any recurrent atrial fibrillation.  On examination today she is having isolated premature beats on auscultation.  However her twelve-lead EKG today did not show any premature beats.  She does have sinus bradycardia from her metoprolol.

## 2013-04-07 NOTE — Progress Notes (Signed)
Mary Knapp Date of Birth:  November 30, 1926 [address] [place], [place]  [address] [phone]  Fax   [phone]  HPI: This pleasant 80 year old woman is seen for a followup office visit.  We had seen her on 11/23/12 after a long absence.  She has a past history of atrial flutter fibrillation.  She does not have any history of ischemic heart disease.  She had a normal Myoview stress test in 2007 with no ischemia and her ejection fraction was 59%.  She denies any chest pain or shortness of breath.  She does have known moderate carotid artery disease but has had no symptoms.  She does not have any history of GI bleeding.  She has not been experiencing any side effects from the Xarelto. The patient has had a 2-D echo on 12/01/12 which showed normal systolic function with an ejection fraction of 55-60%.  There was mild aortic insufficiency, moderate mitral regurgitation, and severe biatrial enlargement.  There was also moderately severe tricuspid regurgitation.  The patient also underwent a 30 day event monitor which shows sinus bradycardia with occasional PVC and occasional PVCs and occasional short runs of paroxysmal atrial fibrillation.  Since last visit she has had no further cardiac symptoms.  Current Outpatient Prescriptions  Medication Sig Dispense Refill  . benazepril-hydrochlorthiazide (LOTENSIN HCT) 10-12.5 MG per tablet as directed. 1/2 TABLET DAILY      . Calcium Carbonate-Vitamin D (CALCIUM + D PO) Take by mouth 2 (two) times daily.      . Cholecalciferol (VITAMIN D PO) Take by mouth.      . Coenzyme Q10 (CO Q 10 PO) Take by mouth daily.      . CRESTOR 10 MG tablet       . metoprolol succinate (TOPROL XL) 25 MG 24 hr tablet Take 1 tablet (25 mg total) by mouth daily.  30 tablet  5  . Multiple Vitamin (MULTIVITAMIN) tablet Take 1 tablet by mouth daily.      . Nutritional Supplements (MELATONIN PO) Take 3 mg by mouth. Takes 1/2 at [place]      . Omega-3 Fatty Acids  (FISH OIL PO) Take by mouth daily.      . Rivaroxaban (XARELTO) 20 MG TABS tablet Take 1 tablet (20 mg total) by mouth daily.  30 tablet  3   No current facility-administered medications for this visit.    No Known Allergies  Patient Active Problem List   Diagnosis Date Noted  . PAF (paroxysmal atrial fibrillation) 01/08/2013  . Pure hypercholesterolemia 01/08/2013  . Benign hypertensive heart disease without heart failure 11/30/2012  . Atrial flutter 11/23/2012  . Vascular disease   . Carotid artery disease     History  Smoking status  . Former Smoker  . Types: Cigarettes  Smokeless tobacco  . Not on file    History  Alcohol Use     Family History  Problem Relation Age of Onset  . Heart disease Father     Review of Systems: The patient denies any heat or cold intolerance.  No weight gain or weight loss.  The patient denies headaches or blurry vision.  There is no cough or sputum production.  The patient denies dizziness.  There is no hematuria or hematochezia.  The patient denies any muscle aches or arthritis.  The patient denies any rash.  The patient denies frequent falling or instability.  There is no history of depression or anxiety.  All other  systems were reviewed and are negative.   Physical Exam: Filed Vitals:   04/07/13 1144  BP: 154/78  Pulse: 51   on examination this is a well-developed well-nourished elderly woman in no distress.Pupils equal and reactive.   Extraocular Movements are full.  There is no scleral icterus.  The mouth and pharynx are normal.  The neck is supple.  The carotids reveal no bruits.  The jugular venous pressure is normal.  The thyroid is not enlarged.  There is no lymphadenopathy.  The chest is clear to percussion and auscultation. There are no rales or rhonchi. Expansion of the chest is symmetrical.  The precordium is quiet.  The first heart sound is normal.  The second heart sound is physiologically split.  There is no murmur gallop  rub or click.  There is no abnormal lift or heave.  LabThe abdomen is soft and nontender. Bowel sounds are normal. The liver and spleen are not enlarged. There Are no abdominal masses. There are no bruits.  The pedal pulses are good.  There is no phlebitis or edema.  There is no cyanosis or clubbing.   EKG today shows sinus bradycardia and no ischemic changes.    Assessment / Plan: 1. paroxysmal atrial flutter-fibrillation.  Chadsvasc score of 4.  Continue Toprol and Xarelto.    2. essential hypertension. 3. moderate mitral regurgitation 4. severe biatrial enlargement 5. severe tricuspid regurgitation by echo  Continue same medication.  Recheck in 3 months for office visit and EKG. also get basal metabolic panel and CBC at her next visit

## 2013-05-09 ENCOUNTER — Other Ambulatory Visit: Payer: Self-pay | Admitting: Cardiology

## 2013-06-28 ENCOUNTER — Telehealth: Payer: Self-pay | Admitting: Cardiology

## 2013-06-28 NOTE — Telephone Encounter (Signed)
New Message:  Pt is requesting a call back from [place]... Pt is asking to be seen before she goes to [place] on June 14 to the 22nd... Pt is scheduled for a July appt. Pt does not want to see a PA.Marland KitchenMarland Kitchen

## 2013-06-29 NOTE — Telephone Encounter (Signed)
Left message to call back  

## 2013-06-29 NOTE — Telephone Encounter (Signed)
Spoke with patient and she will just keep her July appointment

## 2013-08-13 ENCOUNTER — Ambulatory Visit (INDEPENDENT_AMBULATORY_CARE_PROVIDER_SITE_OTHER): Payer: Medicare Other | Admitting: Cardiology

## 2013-08-13 ENCOUNTER — Encounter: Payer: Self-pay | Admitting: Cardiology

## 2013-08-13 VITALS — BP 118/68 | HR 52 | Ht 61.5 in | Wt 136.4 lb

## 2013-08-13 DIAGNOSIS — I480 Paroxysmal atrial fibrillation: Secondary | ICD-10-CM

## 2013-08-13 DIAGNOSIS — I4891 Unspecified atrial fibrillation: Secondary | ICD-10-CM

## 2013-08-13 DIAGNOSIS — I779 Disorder of arteries and arterioles, unspecified: Secondary | ICD-10-CM

## 2013-08-13 DIAGNOSIS — I119 Hypertensive heart disease without heart failure: Secondary | ICD-10-CM

## 2013-08-13 DIAGNOSIS — I739 Peripheral vascular disease, unspecified: Secondary | ICD-10-CM

## 2013-08-13 NOTE — Progress Notes (Signed)
Mary Knapp Date of Birth:  1926/02/25 [facility] [address] [place],   [address] [phone]        Fax   [phone]   History of Present Illness: This pleasant 80 year old widow is seen for a scheduled four-month followup office visit.  He has a past history of atrial flutter fibrillation and is on Xarelto.  She does not have any history of ischemic heart disease.  She had a normal Myoview stress test in 2007 with no ischemia and her ejection fraction was 59%.  She has had a past history of known moderate carotid artery disease.  Her last carotid Dopplers were several years ago. She had a two-dimensional echocardiogram on 12/01/12 which showed normal left Systolic function with an ejection fraction of 55-60%.  There was moderate aortic insufficiency, moderate mitral regurgitation, and severe biatrial enlargement.  There was also moderately severe tricuspid regurgitation.  She had a 30 day event monitor which showed occasional short runs of paroxysmal atrial fibrillation.  Current Outpatient Prescriptions  Medication Sig Dispense Refill  . benazepril-hydrochlorthiazide (LOTENSIN HCT) 10-12.5 MG per tablet as directed. 1/2 TABLET DAILY      . Calcium Carbonate-Vitamin D (CALCIUM + D PO) Take by mouth 2 (two) times daily.      . Cholecalciferol (VITAMIN D PO) Take by mouth.      . Coenzyme Q10 (CO Q 10 PO) Take by mouth daily.      . CRESTOR 10 MG tablet       . LORazepam (ATIVAN) 1 MG tablet       . metoprolol succinate (TOPROL-XL) 25 MG 24 hr tablet TAKE 1 TABLET ONCE DAILY.  30 tablet  3  . Multiple Vitamin (MULTIVITAMIN) tablet Take 1 tablet by mouth daily.      . Nutritional Supplements (MELATONIN PO) Take 3 mg by mouth. Takes 1/2 at [name]      . Omega-3 Fatty Acids (FISH OIL PO) Take by mouth daily.      Alveda Reasons 20 MG TABS tablet TAKE 1 TABLET ONCE DAILY.  30 tablet  3   No current facility-administered medications for this visit.    No Known  Allergies  Patient Active Problem List   Diagnosis Date Noted  . PAF (paroxysmal atrial fibrillation) 01/08/2013  . Pure hypercholesterolemia 01/08/2013  . Benign hypertensive heart disease without heart failure 11/30/2012  . Atrial flutter 11/23/2012  . Vascular disease   . Carotid artery disease     History  Smoking status  . Former Smoker  . Types: Cigarettes  Smokeless tobacco  . Not on file    History  Alcohol Use     Family History  Problem Relation Age of Onset  . Heart disease Father     Review of Systems: Constitutional: no fever chills diaphoresis or fatigue or change in weight.  Head and neck: no hearing loss, no epistaxis, no photophobia or visual disturbance. Respiratory: No cough, shortness of breath or wheezing. Cardiovascular: No chest pain peripheral edema, palpitations. Gastrointestinal: No abdominal distention, no abdominal pain, no change in bowel habits hematochezia or melena. Genitourinary: No dysuria, no frequency, no urgency, no nocturia. Musculoskeletal:No arthralgias, no back pain, no gait disturbance or myalgias. Neurological: No dizziness, no headaches, no numbness, no seizures, no syncope, no weakness, no tremors. Hematologic: No lymphadenopathy, no easy bruising. Psychiatric: No confusion, no hallucinations, no sleep disturbance.    Physical Exam: Filed Vitals:   08/13/13 1543  BP: 118/68  Pulse: 52   the general appearance reveals a well-developed well-nourished woman who appears to be younger than her stated age.The head and neck exam reveals pupils equal and reactive.  Extraocular movements are full.  There is no scleral icterus.  The mouth and pharynx are normal.  The neck is supple.  The carotids reveal faint bruits.  The jugular venous pressure is normal.  The  thyroid is not enlarged.  There is no lymphadenopathy.  The chest is clear to percussion and auscultation.  There are no rales or rhonchi.  Expansion of the chest is  symmetrical.  The precordium is quiet.  The first heart sound is normal.  The second heart sound is physiologically split.  There is no murmur gallop rub or click.  There is no abnormal lift or heave.  The abdomen is soft and nontender.  The bowel sounds are normal.  The liver and spleen are not enlarged.  There are no abdominal masses.  There are no abdominal bruits.  Extremities reveal good pedal pulses.  There is no phlebitis or edema.  There is no cyanosis or clubbing.  Strength is normal and symmetrical in all extremities.  There is no lateralizing weakness.  There are no sensory deficits.  The skin is warm and dry.  There is no rash.  EKG shows sinus bradycardia otherwise within normal limits and is unchanged since 04/07/13   Assessment / Plan: 1. paroxysmal atrial fibrillation, maintaining normal sinus rhythm on current therapy.  On Xarelto. 2. carotid artery disease 3. valvular heart disease with mild aortic insufficiency, moderate mitral regurgitation, and severe biatrial enlargement by echocardiogram 12/01/12.  Ejection fraction 55-60%.  Disposition: Continue same medication.  Return for carotid duplex.  Recheck in 4 months for office visit.

## 2013-08-13 NOTE — Assessment & Plan Note (Signed)
The patient has occasional palpitations more commonly at night.  During the day she states that she stays too busy to notice. The patient is not having any adverse effects from her Xarelto therapy.  She denies any evidence of hemorrhage or bleeding.

## 2013-08-13 NOTE — Patient Instructions (Addendum)
Your physician has requested that you have a carotid duplex. This test is an ultrasound of the carotid arteries in your neck. It looks at blood flow through these arteries that supply the brain with blood. Allow one hour for this exam. There are no restrictions or special instructions.  Your physician recommends that you continue on your current medications as directed. Please refer to the Current Medication list given to you today.  Your physician wants you to follow-up in: 4 month ov You will receive a reminder letter in the mail two months in advance. If you don't receive a letter, please call our office to schedule the follow-up appointment.

## 2013-08-13 NOTE — Assessment & Plan Note (Signed)
The patient is very careful with her diet.  Her blood pressure has been remaining stable on current therapy

## 2013-08-13 NOTE — Assessment & Plan Note (Signed)
The patient has known moderate carotid disease.  We will update her carotid duplex studies.

## 2013-08-18 ENCOUNTER — Encounter (HOSPITAL_COMMUNITY): Payer: Medicare Other

## 2013-08-20 ENCOUNTER — Ambulatory Visit (HOSPITAL_COMMUNITY): Payer: Medicare Other | Attending: Cardiology | Admitting: Cardiology

## 2013-08-20 DIAGNOSIS — I10 Essential (primary) hypertension: Secondary | ICD-10-CM | POA: Diagnosis not present

## 2013-08-20 DIAGNOSIS — Z87891 Personal history of nicotine dependence: Secondary | ICD-10-CM | POA: Diagnosis not present

## 2013-08-20 DIAGNOSIS — I779 Disorder of arteries and arterioles, unspecified: Secondary | ICD-10-CM | POA: Diagnosis present

## 2013-08-20 DIAGNOSIS — I6529 Occlusion and stenosis of unspecified carotid artery: Secondary | ICD-10-CM | POA: Diagnosis not present

## 2013-08-20 DIAGNOSIS — E785 Hyperlipidemia, unspecified: Secondary | ICD-10-CM | POA: Diagnosis not present

## 2013-08-20 DIAGNOSIS — I739 Peripheral vascular disease, unspecified: Secondary | ICD-10-CM

## 2013-08-20 NOTE — Progress Notes (Signed)
Carotid duplex performed 

## 2013-08-21 ENCOUNTER — Other Ambulatory Visit: Payer: Self-pay | Admitting: Cardiology

## 2013-09-20 ENCOUNTER — Other Ambulatory Visit: Payer: Self-pay | Admitting: Cardiology

## 2013-10-29 ENCOUNTER — Telehealth: Payer: Self-pay | Admitting: Cardiology

## 2013-10-29 NOTE — Telephone Encounter (Signed)
Left message to call back  

## 2013-10-29 NOTE — Telephone Encounter (Signed)
New message     Pt states her pulse was 92.  Is this high?

## 2013-10-29 NOTE — Telephone Encounter (Signed)
Call back if anymore questions/problems

## 2013-10-29 NOTE — Telephone Encounter (Signed)
Spoke with patient and she stated she was feeling fine. Advised patient heart rate of 92 ok

## 2013-11-05 ENCOUNTER — Telehealth: Payer: Self-pay | Admitting: Cardiology

## 2013-11-05 NOTE — Telephone Encounter (Signed)
Spoke with patient, returning old message left. Patient states she's doing fine and just saw her PCP

## 2013-11-05 NOTE — Telephone Encounter (Signed)
New message  ° ° °Returning call back to nurse.  °

## 2013-12-19 ENCOUNTER — Other Ambulatory Visit: Payer: Self-pay | Admitting: Cardiology

## 2013-12-21 ENCOUNTER — Encounter: Payer: Self-pay | Admitting: Cardiology

## 2013-12-21 ENCOUNTER — Ambulatory Visit (INDEPENDENT_AMBULATORY_CARE_PROVIDER_SITE_OTHER): Payer: Medicare Other | Admitting: Cardiology

## 2013-12-21 VITALS — BP 124/72 | HR 57 | Ht 61.0 in | Wt 142.0 lb

## 2013-12-21 DIAGNOSIS — I119 Hypertensive heart disease without heart failure: Secondary | ICD-10-CM

## 2013-12-21 DIAGNOSIS — E7800 Pure hypercholesterolemia, unspecified: Secondary | ICD-10-CM

## 2013-12-21 DIAGNOSIS — I779 Disorder of arteries and arterioles, unspecified: Secondary | ICD-10-CM

## 2013-12-21 DIAGNOSIS — I739 Peripheral vascular disease, unspecified: Secondary | ICD-10-CM

## 2013-12-21 DIAGNOSIS — I480 Paroxysmal atrial fibrillation: Secondary | ICD-10-CM

## 2013-12-21 NOTE — Assessment & Plan Note (Signed)
The patient has on Crestor for her hypercholesterolemia and her carotid artery plaque.  She has not been having any myalgias. She has had recent lab work done by her PCP Dr.  Lavone Orn

## 2013-12-21 NOTE — Assessment & Plan Note (Signed)
Pressure is remaining stable on current therapy.

## 2013-12-21 NOTE — Assessment & Plan Note (Signed)
The patient has not been experiencing any awareness of recent palpitations. She has not had any TIA symptoms. No symptoms of congestive heart failure. 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

## 2013-12-21 NOTE — Assessment & Plan Note (Signed)
The patient has not had any TIA or stroke symptoms.

## 2013-12-21 NOTE — Patient Instructions (Signed)
Your physician recommends that you continue on your current medications as directed. Please refer to the Current Medication list given to you today.  Your physician recommends that you schedule a follow-up appointment in: 4 MONTH OV/EKG  

## 2013-12-21 NOTE — Progress Notes (Signed)
Mary Knapp Date of Birth:  1927/01/28 [facility] [address] [place], [place]  [address] [phone]        Fax   [phone]   History of Present Illness: This pleasant 80 year old widow is seen for a scheduled four-month followup office visit. He has a past history of atrial flutter fibrillation and is on Xarelto. She does not have any history of ischemic heart disease. She had a normal Myoview stress test in 2007 with no ischemia and her ejection fraction was 59%. She has had a past history of known moderate carotid artery disease. Her last carotid Dopplers were  Updated this year and showed no progression from prior study. She had a two-dimensional echocardiogram on 12/01/12 which showed normal left Systolic function with an ejection fraction of 55-60%. There was moderate aortic insufficiency, moderate mitral regurgitation, and severe biatrial enlargement. There was also moderately severe tricuspid regurgitation. She had a 30 day event monitor which showed occasional short runs of paroxysmal atrial fibrillation.  since last visit she has been doing well.  Current Outpatient Prescriptions  Medication Sig Dispense Refill  . benazepril-hydrochlorthiazide (LOTENSIN HCT) 10-12.5 MG per tablet as directed. 1/2 TABLET DAILY    . Calcium Carbonate-Vitamin D (CALCIUM + D PO) Take by mouth 2 (two) times daily.    . Cholecalciferol (VITAMIN D PO) Take by mouth.    . Coenzyme Q10 (CO Q 10 PO) Take by mouth daily.    . CRESTOR 10 MG tablet     . LORazepam (ATIVAN) 1 MG tablet     . metoprolol succinate (TOPROL-XL) 25 MG 24 hr tablet TAKE 1 TABLET ONCE DAILY. 30 tablet 4  . Multiple Vitamin (MULTIVITAMIN) tablet Take 1 tablet by mouth daily.    . Nutritional Supplements (MELATONIN PO) Take 3 mg by mouth. Takes 1/2 at [place]    . Omega-3 Fatty Acids (FISH OIL PO) Take by mouth daily.    . rivaroxaban (XARELTO) 20 MG TABS tablet Take 1 tablet (20 mg total) by mouth  daily. 30 tablet 6   No current facility-administered medications for this visit.    No Known Allergies  Patient Active Problem List   Diagnosis Date Noted  . PAF (paroxysmal atrial fibrillation) 01/08/2013  . Pure hypercholesterolemia 01/08/2013  . Benign hypertensive heart disease without heart failure 11/30/2012  . Atrial flutter 11/23/2012  . Vascular disease   . Carotid artery disease     History  Smoking status  . Former Smoker  . Types: Cigarettes  Smokeless tobacco  . Not on file    History  Alcohol Use: Not on file    Family History  Problem Relation Age of Onset  . Heart disease Father     Review of Systems: Constitutional: no fever chills diaphoresis or fatigue or change in weight.  Head and neck: no hearing loss, no epistaxis, no photophobia or visual disturbance. Respiratory: No cough, shortness of breath or wheezing. Cardiovascular: No chest pain peripheral edema, palpitations. Gastrointestinal: No abdominal distention, no abdominal pain, no change in bowel habits hematochezia or melena. Genitourinary: No dysuria, no frequency, no urgency, no nocturia. Musculoskeletal:No arthralgias, no back pain, no gait disturbance or myalgias. Neurological: No dizziness, no headaches, no numbness, no seizures, no syncope, no weakness, no tremors. Hematologic: No lymphadenopathy, no easy bruising. Psychiatric: No confusion, no hallucinations, no sleep disturbance.   Wt Readings from Last 3 Encounters:  12/21/13 142 lb (64.411 kg)  08/13/13 136 lb 6.4 oz (61.871  kg)  04/07/13 138 lb (62.596 kg)    Physical Exam: Filed Vitals:   12/21/13 1544  BP: 124/72  Pulse: 57  The patient appears to be in no distress.  Head and neck exam reveals that the pupils are equal and reactive.  The extraocular movements are full.  There is no scleral icterus.  Mouth and pharynx are benign.  No lymphadenopathy.  No carotid bruits.  The jugular venous pressure is normal.  Thyroid  is not enlarged or tender.  Chest is clear to percussion and auscultation.  No rales or rhonchi.  Expansion of the chest is symmetrical.  Heart reveals no abnormal lift or heave.  First and second heart sounds are normal.  There is no murmur gallop rub or click.  The abdomen is soft and nontender.  Bowel sounds are normoactive.  There is no hepatosplenomegaly or mass.  There are no abdominal bruits.  Extremities reveal no phlebitis or edema.  Pedal pulses are good.  There is no cyanosis or clubbing.  Neurologic exam is normal strength and no lateralizing weakness.  No sensory deficits.  Integument reveals no rash    Assessment / Plan: 1.  History of paroxysmal atrial fibrillation on long-term anticoagulation with Xarelto. 2.  Moderate bilateral carotid artery disease , asymptomatic 3.  Hypercholesterolemia 4.  Essential hypertension without heart failure    disposition: continue current medication. Recheck in 4 months for office visit and EKG. She will bring [place] copies of her recent lab work from Dr. Laurann Montana.

## 2014-01-17 ENCOUNTER — Other Ambulatory Visit: Payer: Self-pay | Admitting: Cardiology

## 2014-04-08 ENCOUNTER — Telehealth: Payer: Self-pay | Admitting: Cardiology

## 2014-04-08 NOTE — Telephone Encounter (Signed)
Walk in pt form " [employer]" papers Dropped Off Mary Knapp back Tuesday 3/15

## 2014-04-20 ENCOUNTER — Ambulatory Visit: Payer: Medicare Other | Admitting: Cardiology

## 2014-04-28 ENCOUNTER — Telehealth: Payer: Self-pay | Admitting: *Deleted

## 2014-04-28 NOTE — Telephone Encounter (Signed)
Prior auth # [id number] valid 04/01/14 - 04/08/15 For Xarelto

## 2014-05-19 ENCOUNTER — Other Ambulatory Visit: Payer: Self-pay | Admitting: Internal Medicine

## 2014-05-19 ENCOUNTER — Ambulatory Visit
Admission: RE | Admit: 2014-05-19 | Discharge: 2014-05-19 | Disposition: A | Discharge status: Home / Self Care | Payer: Self-pay | Source: Ambulatory Visit | Attending: Internal Medicine | Admitting: Internal Medicine

## 2014-05-19 DIAGNOSIS — M79641 Pain in right hand: Secondary | ICD-10-CM

## 2014-05-22 ENCOUNTER — Other Ambulatory Visit: Payer: Self-pay | Admitting: Cardiology

## 2014-05-31 ENCOUNTER — Ambulatory Visit (INDEPENDENT_AMBULATORY_CARE_PROVIDER_SITE_OTHER): Payer: Medicare Other | Admitting: Cardiology

## 2014-05-31 ENCOUNTER — Encounter: Payer: Self-pay | Admitting: Cardiology

## 2014-05-31 VITALS — BP 130/66 | HR 53 | Ht 61.0 in | Wt 138.2 lb

## 2014-05-31 DIAGNOSIS — I779 Disorder of arteries and arterioles, unspecified: Secondary | ICD-10-CM

## 2014-05-31 DIAGNOSIS — E7800 Pure hypercholesterolemia, unspecified: Secondary | ICD-10-CM

## 2014-05-31 DIAGNOSIS — I739 Peripheral vascular disease, unspecified: Secondary | ICD-10-CM

## 2014-05-31 DIAGNOSIS — I480 Paroxysmal atrial fibrillation: Secondary | ICD-10-CM

## 2014-05-31 NOTE — Patient Instructions (Signed)
Medication Instructions:  Your physician recommends that you continue on your current medications as directed. Please refer to the Current Medication list given to you today.  Labwork: none  Testing/Procedures: none  Follow-Up: Your physician wants you to follow-up in: 6 month ov You will receive a reminder letter in the mail two months in advance. If you don't receive a letter, please call our office to schedule the follow-up appointment.   Any Other Special Instructions Will Be Listed Below (If Applicable).

## 2014-05-31 NOTE — Progress Notes (Signed)
Cardiology Office Note   Date:  05/31/2014   ID:  Mary Knapp, DOB 11-10-1926, MRN [id number]  PCP:  Irven Shelling, MD  Cardiologist: Darlin Coco MD  No chief complaint on file.     History of Present Illness: Mary Knapp is a 81 y.o. female who presents for follow-up office visit  This pleasant 81 year old widow is seen for a scheduled four-month followup office visit. He has a past history of atrial flutter fibrillation and is on Xarelto. She does not have any history of ischemic heart disease. She had a normal Myoview stress test in 2007 with no ischemia and her ejection fraction was 59%. She has had a past history of known moderate carotid artery disease. Her last carotid Dopplers were Updated this year and showed no progression from prior study. She had a two-dimensional echocardiogram on 12/01/12 which showed normal left Systolic function with an ejection fraction of 55-60%. There was moderate aortic insufficiency, moderate mitral regurgitation, and severe biatrial enlargement. There was also moderately severe tricuspid regurgitation. She had a 30 day event monitor which showed occasional short runs of paroxysmal atrial fibrillation. since last visit she has been doing well over the cardiac standpoint.  Unfortunately she fell and broke her right wrist and hand on April 13.  Her hand is in a splint. She has not had any recurrent atrial fibrillation  Past Medical History  Diagnosis Date  . Vascular disease   . Carotid artery disease     nonobstructive  . Hyperlipidemia   . Hypertension     Past Surgical History  Procedure Laterality Date  . Childbirth      x4     Current Outpatient Prescriptions  Medication Sig Dispense Refill  . benazepril-hydrochlorthiazide (LOTENSIN HCT) 10-12.5 MG per tablet as directed. 1/2 TABLET DAILY    . Calcium Carbonate-Vitamin D (CALCIUM + D PO) Take 1 tablet by mouth 2 (two) times daily.     . Cholecalciferol  (VITAMIN D PO) Take 1 tablet by mouth daily.     . Coenzyme Q10 (CO Q 10 PO) Take by mouth daily.    . CRESTOR 10 MG tablet Take 10 mg by mouth daily.     Marland Kitchen LORazepam (ATIVAN) 1 MG tablet Take 1 mg by mouth at bedtime.     . metoprolol succinate (TOPROL-XL) 25 MG 24 hr tablet Take 25 mg by mouth daily.    . Multiple Vitamin (MULTIVITAMIN) tablet Take 1 tablet by mouth daily.    . Omega-3 Fatty Acids (FISH OIL PO) Take 1 tablet by mouth daily.     . rivaroxaban (XARELTO) 20 MG TABS tablet Take 1 tablet (20 mg total) by mouth daily. 30 tablet 6   No current facility-administered medications for this visit.    Allergies:   Review of patient's allergies indicates no known allergies.    Social History:  The patient  reports that she has quit smoking. Her smoking use included Cigarettes. She does not have any smokeless tobacco history on file.   Family History:  The patient's family history includes Heart disease in her father.    ROS:  Please see the history of present illness.   Otherwise, review of systems are positive for none.   All other systems are reviewed and negative.    PHYSICAL EXAM: VS:  BP 130/66 mmHg  Pulse 53  Ht 5\' 1"  (1.549 m)  Wt 138 lb 3.2 oz (62.687 kg)  BMI 26.13 kg/m2 , BMI Body mass  index is 26.13 kg/(m^2). GEN: Well nourished, well developed, in no acute distress HEENT: normal Neck: no JVD, carotid bruits, or masses Cardiac: RRR; no murmurs, rubs, or gallops,no edema  Respiratory:  clear to auscultation bilaterally, normal work of breathing GI: soft, nontender, nondistended, + BS MS: no deformity or atrophy Skin: warm and dry, no rash Neuro:  Strength and sensation are intact Psych: euthymic mood, full affect   EKG:  EKG is ordered today. The ekg ordered today demonstrates sinus bradycardia at 53 bpm.  No ischemic changes.   Recent Labs: No results found for requested labs within last 365 days.    Lipid Panel No results found for: CHOL, TRIG, HDL,  CHOLHDL, VLDL, LDLCALC, LDLDIRECT    Wt Readings from Last 3 Encounters:  05/31/14 138 lb 3.2 oz (62.687 kg)  12/21/13 142 lb (64.411 kg)  08/13/13 136 lb 6.4 oz (61.871 kg)         ASSESSMENT AND PLAN:  1.  Paroxysmal atrial fibrillation, maintaining normal sinus rhythm 2.  Essential hypertension 3.  Hypercholesterolemia followed by her PCP   Current medicines are reviewed at length with the patient today.  The patient does not have concerns regarding medicines.  The following changes have been made:  no change  Labs/ tests ordered today include:   Orders Placed This Encounter  Procedures  . EKG 12-Lead     Disposition: Continue same medication.  Recheck in 6 months for office visit  Signed, Darlin Coco MD 05/31/2014 7:08 PM    [facility] [place], [place], [place]  [address] Phone: [phone]; Fax: [phone]

## 2014-07-11 ENCOUNTER — Telehealth: Payer: Self-pay | Admitting: Cardiology

## 2014-07-11 NOTE — Telephone Encounter (Signed)
Calling stating her HR yesterday and this AM was 112-115,  States she hasn't taken BP on her home monitor for over a month.  Yesterday BP was 130/80 HR 115. Today BP is 125/80 HR 112.  States she feel good.  Denies SOB or light headedness. Is taking Benazepril/HCTZ 10/12.5 and Metoprolol succ 25 mg daily. Just wanted Dr. Mare Ferrari ot be aware. Advised to continue to monitor and if HR is consistently  over 120-130 range then she needs to go to ER. Will forward message to Dr. Mare Ferrari to see if he wants to make any recommendations.

## 2014-07-11 NOTE — Telephone Encounter (Signed)
New message       How high is too high for a pulse rate?

## 2014-07-11 NOTE — Telephone Encounter (Signed)
Agree with advice given.  Normally her pulse is in the 50s. She may have gone back into atrial fibrillation causing the fast heart rate.If heart rate remains rapid she can increase her Toprol to 25 mg BID.  She is already on anticoagulation.

## 2014-07-15 NOTE — Telephone Encounter (Signed)
Advised per Dr. Mare Ferrari to take the Toprol 25 mg BID as long as rate is elevated.  States her HR this week has still been around 110.  Advised to take Metoprolol Succ 25 mg BID and if HR doesn't come down in about a week to call the office. She verbalizes understanding and will continue to monitor her HR.

## 2014-07-17 ENCOUNTER — Other Ambulatory Visit: Payer: Self-pay | Admitting: Cardiology

## 2014-07-18 ENCOUNTER — Other Ambulatory Visit: Payer: Self-pay

## 2014-07-18 MED ORDER — RIVAROXABAN 20 MG PO TABS: 20.0000 mg | ORAL_TABLET | Freq: Every day | ORAL | Status: DC

## 2014-07-18 MED ORDER — METOPROLOL SUCCINATE ER 25 MG PO TB24: 25.0000 mg | ORAL_TABLET | Freq: Every day | ORAL | Status: DC

## 2014-07-19 ENCOUNTER — Telehealth: Payer: Self-pay | Admitting: Cardiology

## 2014-07-19 NOTE — Telephone Encounter (Signed)
New Message  Pt called to speak w/ RN abouit metoprolol. Continue or not after current reaction?

## 2014-07-19 NOTE — Telephone Encounter (Signed)
Left message to call back  

## 2014-07-20 NOTE — Telephone Encounter (Signed)
Follow up      Returning Mary Knapp's call.  Pt want to know if having a pulse of 45 too low.

## 2014-07-20 NOTE — Telephone Encounter (Signed)
Heart rate 54 today Patient states she is only taking Metoprolol once daily now, only took twice a day for 2 days Patient states heart rate mostly back in 50's which she states is her normal  Advised to continue to monitor and call back if decreases, verbalized understanding  Discussed with  Dr. Mare Ferrari and he agreed with advice give

## 2014-10-26 ENCOUNTER — Telehealth: Payer: Self-pay | Admitting: Cardiology

## 2014-10-26 NOTE — Telephone Encounter (Signed)
Called patient about her message. Patient stated that her BP is running 115/70 and HR 59. Patient's energy is low during the day and she is having trouble sleeping at night. Patient wants to know if maybe her BP medications need to be reduced. Will forward to Dr. Mare Ferrari and his nurse.

## 2014-10-26 NOTE — Telephone Encounter (Signed)
New message    Patient calling will discuss when the nurse call back.    Pt c/o medication issue:  1. Name of Medication: metoprolol  25 mg   2. How are you currently taking this medication (dosage and times per day)? Am during breakfast   3. Are you having a reaction (difficulty breathing--STAT)? Tired sometimes during the day.   4. What is your medication issue? Wants to decrease medication

## 2014-10-27 NOTE — Telephone Encounter (Signed)
Okay to try reducing dose to 12.5 mg daily.

## 2014-10-27 NOTE — Telephone Encounter (Signed)
Advised patient

## 2014-12-21 ENCOUNTER — Encounter: Payer: Self-pay | Admitting: Cardiology

## 2014-12-21 ENCOUNTER — Ambulatory Visit (INDEPENDENT_AMBULATORY_CARE_PROVIDER_SITE_OTHER): Payer: Medicare Other | Admitting: Cardiology

## 2014-12-21 VITALS — BP 140/60 | HR 80 | Ht 61.0 in | Wt 138.8 lb

## 2014-12-21 DIAGNOSIS — I999 Unspecified disorder of circulatory system: Secondary | ICD-10-CM

## 2014-12-21 DIAGNOSIS — I779 Disorder of arteries and arterioles, unspecified: Secondary | ICD-10-CM

## 2014-12-21 DIAGNOSIS — I480 Paroxysmal atrial fibrillation: Secondary | ICD-10-CM | POA: Diagnosis not present

## 2014-12-21 DIAGNOSIS — I739 Peripheral vascular disease, unspecified: Secondary | ICD-10-CM

## 2014-12-21 NOTE — Patient Instructions (Signed)
Medication Instructions:  Your physician recommends that you continue on your current medications as directed. Please refer to the Current Medication list given to you today.  Labwork: none  Testing/Procedures: Your physician has requested that you have a carotid duplex. This test is an ultrasound of the carotid arteries in your neck. It looks at blood flow through these arteries that supply the brain with blood. Allow one hour for this exam. There are no restrictions or special instructions.  Follow up: Your physician recommends that you schedule a follow-up appointment in: 6 month ov with Tera Helper NP or Brynda Rim PA   If you need a refill on your cardiac medications before your next appointment, please call your pharmacy.

## 2014-12-21 NOTE — Progress Notes (Signed)
Cardiology Office Note   Date:  12/21/2014   ID:  Mary Knapp, DOB [birth year], MRN [id number]  PCP:  Irven Shelling, MD  Cardiologist: Darlin Coco MD  No chief complaint on file.     History of Present Illness: Mary Knapp is a 81 y.o. female who presents for a six-month follow-up visit This pleasant 81 year old widow is seen for a scheduled four-month followup office visit. She has a past history of atrial flutter fibrillation and is on Xarelto. She does not have any history of ischemic heart disease. She had a normal Myoview stress test in 2007 with no ischemia and her ejection fraction was 59%. She has had a past history of known moderate carotid artery disease.  She had initial Dopplers in 2011 because of audible bruits. Her last carotid Dopplers werein July 2015  and showed no progression from prior study. She had a two-dimensional echocardiogram on 12/01/12 which showed normal left Systolic function with an ejection fraction of 55-60%. There was moderate aortic insufficiency, moderate mitral regurgitation, and severe biatrial enlargement. There was also moderately severe tricuspid regurgitation. She had a 30 day event monitor which showed occasional short runs of paroxysmal atrial fibrillation. since last visit she has been doing well over the cardiac standpoint.  She still lives in her own home by herself.  She takes care of all of her own affairs.  She enjoys driving her car. She has not been aware of any racing of her heart or paroxysmal atrial fibrillation.  She has not had any TIA symptoms.  No chest pain or shortness of breath.  Exercise tolerance is good.   Past Medical History  Diagnosis Date  . Vascular disease   . Carotid artery disease (HCC)     nonobstructive  . Hyperlipidemia   . Hypertension     Past Surgical History  Procedure Laterality Date  . Childbirth      x4     Current Outpatient Prescriptions  Medication Sig Dispense  Refill  . benazepril-hydrochlorthiazide (LOTENSIN HCT) 10-12.5 MG per tablet Take 0.5 tablets by mouth daily.     . Calcium Carbonate-Vitamin D (CALCIUM + D PO) Take 1 tablet by mouth 2 (two) times daily.     . Cholecalciferol (VITAMIN D PO) Take 1 tablet by mouth daily.     . Coenzyme Q10 (CO Q 10 PO) Take 1 capsule by mouth daily.     . CRESTOR 10 MG tablet Take 10 mg by mouth daily.     Marland Kitchen LORazepam (ATIVAN) 1 MG tablet Take 1 mg by mouth at bedtime.     . metoprolol tartrate (LOPRESSOR) 25 MG tablet Take 12.5 mg by mouth daily.     . Multiple Vitamin (MULTIVITAMIN) tablet Take 1 tablet by mouth daily.    . Omega-3 Fatty Acids (FISH OIL PO) Take 1 tablet by mouth daily.     . rivaroxaban (XARELTO) 20 MG TABS tablet Take 1 tablet (20 mg total) by mouth daily. 90 tablet 1   No current facility-administered medications for this visit.    Allergies:   Review of patient's allergies indicates no known allergies.    Social History:  The patient  reports that she has quit smoking. Her smoking use included Cigarettes. She does not have any smokeless tobacco history on file.   Family History:  The patient's family history includes Heart disease in her father.    ROS:  Please see the history of present illness.   Otherwise, review  of systems are positive for none.   All other systems are reviewed and negative.    PHYSICAL EXAM: VS:  BP 140/60 mmHg  Pulse 80  Ht 5\' 1"  (1.549 m)  Wt 138 lb 12.8 oz (62.959 kg)  BMI 26.24 kg/m2 , BMI Body mass index is 26.24 kg/(m^2). GEN: Well nourished, well developed, in no acute distress HEENT: normal Neck: no JVD, there is a soft right carotid bruit audible,  Cardiac: RRR; no murmurs, rubs, or gallops,no edema  Respiratory:  clear to auscultation bilaterally, normal work of breathing GI: soft, nontender, nondistended, + BS MS: no deformity or atrophy Skin: warm and dry, no rash Neuro:  Strength and sensation are intact Psych: euthymic mood, full  affect   EKG:  EKG is not ordered today.    Recent Labs: No results found for requested labs within last 365 days.    Lipid Panel No results found for: CHOL, TRIG, HDL, CHOLHDL, VLDL, LDLCALC, LDLDIRECT    Wt Readings from Last 3 Encounters:  12/21/14 138 lb 12.8 oz (62.959 kg)  05/31/14 138 lb 3.2 oz (62.687 kg)  12/21/13 142 lb (64.411 kg)        ASSESSMENT AND PLAN:  1. Paroxysmal atrial fibrillation, maintaining normal sinus rhythm 2. Essential hypertension 3. Hypercholesterolemia followed by her PCP   Current medicines are reviewed at length with the patient today.  The patient does not have concerns regarding medicines.  The following changes have been made:  no change  Labs/ tests ordered today include:  No orders of the defined types were placed in this encounter.   Disposition: We will update her carotid duplex ultrasounds.  We will have her return in 6 months for a follow-up office visit with Cecille Rubin.  Berna Spare MD 12/21/2014 5:06 PM    [facility] [place], [place], [place]  [address] Phone: [phone]; Fax: [phone]

## 2014-12-26 ENCOUNTER — Other Ambulatory Visit: Payer: Self-pay | Admitting: Cardiology

## 2014-12-26 DIAGNOSIS — I6523 Occlusion and stenosis of bilateral carotid arteries: Secondary | ICD-10-CM

## 2014-12-27 ENCOUNTER — Ambulatory Visit (HOSPITAL_COMMUNITY)
Admission: RE | Admit: 2014-12-27 | Discharge: 2014-12-27 | Disposition: A | Discharge status: Home / Self Care | Payer: Medicare Other | Source: Ambulatory Visit | Attending: Cardiovascular Disease | Admitting: Cardiovascular Disease

## 2014-12-27 DIAGNOSIS — I10 Essential (primary) hypertension: Secondary | ICD-10-CM | POA: Diagnosis not present

## 2014-12-27 DIAGNOSIS — I779 Disorder of arteries and arterioles, unspecified: Secondary | ICD-10-CM

## 2014-12-27 DIAGNOSIS — I6523 Occlusion and stenosis of bilateral carotid arteries: Secondary | ICD-10-CM | POA: Diagnosis not present

## 2014-12-27 DIAGNOSIS — E785 Hyperlipidemia, unspecified: Secondary | ICD-10-CM | POA: Diagnosis not present

## 2014-12-27 DIAGNOSIS — I739 Peripheral vascular disease, unspecified: Secondary | ICD-10-CM

## 2015-01-08 ENCOUNTER — Other Ambulatory Visit: Payer: Self-pay | Admitting: Cardiology

## 2015-01-12 ENCOUNTER — Telehealth: Payer: Self-pay | Admitting: *Deleted

## 2015-01-12 NOTE — Telephone Encounter (Signed)
Spoke with patient and she has been taking Metoprolol full tablet daily Advised her to continue taking her medications as has been

## 2015-01-12 NOTE — Telephone Encounter (Signed)
melinda please call and verify metoprolol dosage with pt, she wants to be back on a whole tablet of metoprolol

## 2015-01-13 MED ORDER — METOPROLOL SUCCINATE ER 25 MG PO TB24: 25.0000 mg | ORAL_TABLET | Freq: Every day | ORAL | Status: DC

## 2015-01-13 NOTE — Telephone Encounter (Signed)
Spoke with patient and she would like to follow up with Dr Acie Fredrickson when  Dr. Wendall Papa

## 2015-01-13 NOTE — Telephone Encounter (Signed)
Okay for whole tablet

## 2015-02-21 ENCOUNTER — Telehealth: Payer: Self-pay | Admitting: Cardiology

## 2015-02-21 NOTE — Telephone Encounter (Signed)
At her last visit she was on Lopressor and now is she on both tartrate and succinate? Her feeling tired may relate to her low pulse.  She should keep daily record of pulse and BP and let us know.

## 2015-02-21 NOTE — Telephone Encounter (Signed)
Spoke with patient  She does not check her blood pressure often States feels a "little draggy" Will forward to  Dr. Mare Ferrari for review

## 2015-02-21 NOTE — Telephone Encounter (Signed)
New message     Pt c/o BP issue: STAT if pt c/o blurred vision, one-sided weakness or slurred speech  1. What are your last 5 BP readings?  Today  148/84 -  5 min ago. Pulse is 47   2. Are you having any other symptoms (ex. Dizziness, headache, blurred vision, passed out)? No   3. What is your BP issue? Wants to discuss

## 2015-02-22 NOTE — Telephone Encounter (Signed)
lmtcb

## 2015-02-22 NOTE — Telephone Encounter (Signed)
F/u ° ° °Pt returning your call °

## 2015-02-22 NOTE — Telephone Encounter (Signed)
Returning call.  Advised that Dr. Mare Ferrari suggests since she is taking Lopressor and Toprol XL that could make her feel tired and have a low pulse rate.  Advised to keep a daily record of her pulse and BP.  States today BP was 140/76 HR 56.  She states she is going to [place] to visit her daughter and won't be back until mid February.  She will try to take her BP and record and call us with results.

## 2015-04-28 ENCOUNTER — Telehealth: Payer: Self-pay | Admitting: Nurse Practitioner

## 2015-04-28 NOTE — Telephone Encounter (Signed)
Spoke with patient and she is asymptomatic just concerned about her heart rate Explained heart rate can fluctuate and these number are ok  Stated her blood pressure has been going up and down but does not take on a regular basis Advised to start taking daily and call back next week with update

## 2015-04-28 NOTE — Telephone Encounter (Signed)
New Message:  Pt is calling in stating that today her heart rate has been up to 85-90 rate. She is concerned and would like to talk to someone about this . Please f/u with her

## 2015-05-05 NOTE — Telephone Encounter (Signed)
Spoke with patient and she is doing better Did have a couple of episodes of her heart pounding during the night but subsided quickly  Offered earlier appointment but she wanted to wait until scheduled follow up, will call back if anything changes

## 2015-06-20 ENCOUNTER — Telehealth: Payer: Self-pay | Admitting: *Deleted

## 2015-06-20 ENCOUNTER — Encounter: Payer: Self-pay | Admitting: Nurse Practitioner

## 2015-06-20 ENCOUNTER — Ambulatory Visit (INDEPENDENT_AMBULATORY_CARE_PROVIDER_SITE_OTHER): Payer: Medicare Other | Admitting: Nurse Practitioner

## 2015-06-20 VITALS — BP 110/80 | HR 68 | Ht 61.5 in | Wt 139.4 lb

## 2015-06-20 DIAGNOSIS — I480 Paroxysmal atrial fibrillation: Secondary | ICD-10-CM | POA: Diagnosis not present

## 2015-06-20 NOTE — Progress Notes (Signed)
CARDIOLOGY OFFICE NOTE  Date:  06/20/2015    Mary Knapp Date of Birth: 12/04/1926 Medical Record [id number]  PCP:  Irven Shelling, MD  Cardiologist:  Former patient of Dr. Sherryl Barters - to follow with     Chief Complaint  Patient presents with  . Atrial Fibrillation  . Hyperlipidemia  . Hypertension    6 month check - former patient of Dr. Sherryl Barters    History of Present Illness: Mary Knapp is a 82 y.o. female who presents today for a 6 month check. Former patient of Dr. Sherryl Barters.   She has a history of PAF/flutter - on Xarelto, HLD, HTN and PVD. She does not have any history of ischemic heart disease. She had a normal Myoview stress test in 2007 with no ischemia and her ejection fraction was 59%. She has had a two-dimensional echocardiogram on 12/01/12 which showed normal left Systolic function with an ejection fraction of 55-60%. There was moderate aortic insufficiency, moderate mitral regurgitation, and severe biatrial enlargement. There was also moderately severe tricuspid regurgitation. She has had a 30 day event monitor which showed occasional short runs of paroxysmal atrial fibrillation.  Last seen back in November and was felt to be doing ok.  valvu Comes in today. Here alone. Lots of questions about who will be following her going forward. Seems to be doing ok. Very tangential in her thoughts. No chest pain. Remains pretty active. Goes to lots of Public affairs consultant. She is dating someone 63. Some bruising. Says her labs are checked by PCP. No active bleeding. No falls. No real palpitations. Does not sound like she is to bothered by her atrial fib.    Past Medical History  Diagnosis Date  . Carotid artery disease (HCC)     nonobstructive  . Hyperlipidemia   . Hypertension   . PAF (paroxysmal atrial fibrillation) ([place])   . H/O atrial flutter   . Chronic anticoagulation   . Valvular heart disease     Past Surgical History  Procedure Laterality Date    . Childbirth      x4     Medications: Current Outpatient Prescriptions  Medication Sig Dispense Refill  . benazepril-hydrochlorthiazide (LOTENSIN HCT) 10-12.5 MG per tablet Take 0.5 tablets by mouth daily.     . Calcium Carbonate-Vitamin D (CALCIUM + D PO) Take 1 tablet by mouth 2 (two) times daily.     . Cholecalciferol (VITAMIN D PO) Take 1 tablet by mouth daily.     . Coenzyme Q10 (CO Q 10 PO) Take 1 capsule by mouth daily.     . CRESTOR 10 MG tablet Take 10 mg by mouth daily.     Marland Kitchen LORazepam (ATIVAN) 1 MG tablet Take 1 mg by mouth at bedtime.     . metoprolol succinate (TOPROL-XL) 25 MG 24 hr tablet Take 1 tablet (25 mg total) by mouth daily. 90 tablet 3  . metoprolol tartrate (LOPRESSOR) 25 MG tablet Take 12.5 mg by mouth daily.     . Multiple Vitamin (MULTIVITAMIN) tablet Take 1 tablet by mouth daily.    . Omega-3 Fatty Acids (FISH OIL PO) Take 1 tablet by mouth daily.     . rivaroxaban (XARELTO) 20 MG TABS tablet Take 1 tablet (20 mg total) by mouth daily. 90 tablet 3   No current facility-administered medications for this visit.    Allergies: No Known Allergies  Social History: The patient  reports that she has quit smoking. Her smoking use included Cigarettes.  She does not have any smokeless tobacco history on file.   Family History: The patient's family history includes Heart disease in her father.   Review of Systems: Please see the history of present illness.   Otherwise, the review of systems is positive for none.   All other systems are reviewed and negative.   Physical Exam: VS:  BP 110/80 mmHg  Pulse 68  Ht 5' 1.5" (1.562 m)  Wt 139 lb 6.4 oz (63.231 kg)  BMI 25.92 kg/m2 .  BMI Body mass index is 25.92 kg/(m^2).  Wt Readings from Last 3 Encounters:  06/20/15 139 lb 6.4 oz (63.231 kg)  12/21/14 138 lb 12.8 oz (62.959 kg)  05/31/14 138 lb 3.2 oz (62.687 kg)    General: Pleasant. Elderly female who is alert and in no acute distress. She looks younger  than her stated age.  HEENT: Normal. Neck: Supple, no JVD, carotid bruits, or masses noted.  Cardiac: Irregular irregular rhythm today. Rate is ok. No edema.  Respiratory:  Lungs are clear to auscultation bilaterally with normal work of breathing.  GI: Soft and nontender.  MS: No deformity or atrophy. Gait and ROM intact. Skin: Warm and dry. Color is normal.  Neuro:  Strength and sensation are intact and no gross focal deficits noted.  Psych: Alert, appropriate and with normal affect.   LABORATORY DATA:  EKG:  EKG is ordered today. This shows atrial fib with a controlled VR.  Lab Results  Component Value Date   WBC 6.2 11/23/2012   HGB 13.8 11/23/2012   HCT 40.8 11/23/2012   PLT 215.0 11/23/2012   GLUCOSE 93 11/23/2012   NA 136 11/23/2012   K 4.1 11/23/2012   CL 95* 11/23/2012   CREATININE 0.9 11/23/2012   BUN 25* 11/23/2012   CO2 32 11/23/2012   TSH 2.86 11/23/2012    BNP (last 3 results) No results for input(s): BNP in the last 8760 hours.  ProBNP (last 3 results) No results for input(s): PROBNP in the last 8760 hours.   Other Studies Reviewed Today:  Echo Study Conclusions from 11/2012  - Left ventricle: The cavity size was normal. Wall thickness was normal. Systolic function was normal. The estimated ejection fraction was in the range of 55% to 60%. - Aortic valve: Mild regurgitation. - Mitral valve: Moderate regurgitation. - Left atrium: The atrium was moderately to severely dilated. - Right atrium: The atrium was moderately to severely dilated. - Atrial septum: No defect or patent foramen ovale was identified. - Tricuspid valve: Moderate-severe regurgitation. - Pulmonary arteries: PA peak pressure: 28mm Hg (S).  Assessment/Plan: 1. Paroxysmal atrial fibrillation/flutter - managed with rate control and anticoagulation. She is in atrial fib today - pretty asymptomatic. Would continue with her current regimen. Will get her established with Dr.  Oval Linsey.  2. HTN - BP ok on current regimen. No changes made.   3. HLD - followed by PCP  4. Chronic anticoagulation - no problems noted.   5. Carotid stenosis - needs updating in November of 2017  Current medicines are reviewed with the patient today.  The patient does not have concerns regarding medicines other than what has been noted above.  The following changes have been made:  See above.  Labs/ tests ordered today include:   No orders of the defined types were placed in this encounter.     Disposition:   FU with Dr. Oval Linsey in 4 months.   Patient is agreeable to this plan and will call if  any problems develop in the interim.   Signed: Burtis Junes, RN, ANP-C 06/20/2015 1:49 PM  [facility] [address] [place], [place]  [address] Phone: [phone] Fax: [phone]

## 2015-06-20 NOTE — Telephone Encounter (Signed)
Requested recent lab results from Dr. Delene Ruffini office to be faxed over to our office

## 2015-06-20 NOTE — Patient Instructions (Addendum)
We will be checking the following labs today - NONE  See if we can get a copy of her most recent labs from Dr. Lavone Orn.   Medication Instructions:    Continue with your current medicines.     Testing/Procedures To Be Arranged:  N/A  Follow-Up:   See Dr. Oval Linsey in 4 months.      Other Special Instructions:   N/A    If you need a refill on your cardiac medications before your next appointment, please call your pharmacy.   Call the [place] office at [phone] if you have any questions, problems or concerns.

## 2015-06-21 ENCOUNTER — Other Ambulatory Visit: Payer: Self-pay | Admitting: *Deleted

## 2015-06-21 DIAGNOSIS — I480 Paroxysmal atrial fibrillation: Secondary | ICD-10-CM

## 2015-06-22 ENCOUNTER — Encounter: Payer: Self-pay | Admitting: Nurse Practitioner

## 2015-06-22 ENCOUNTER — Encounter: Payer: Self-pay | Admitting: Cardiology

## 2015-06-22 ENCOUNTER — Other Ambulatory Visit: Payer: Medicare Other

## 2015-06-22 ENCOUNTER — Telehealth: Payer: Self-pay | Admitting: Nurse Practitioner

## 2015-06-22 NOTE — Telephone Encounter (Signed)
Left message to call back  

## 2015-06-22 NOTE — Telephone Encounter (Signed)
New Message  Pt requested to speak w/ RN concerning the lab results from yesterday 5/24- Please call back and discuss.

## 2015-06-22 NOTE — Telephone Encounter (Signed)
Pt called as she is worried about her results and wanted to know if she needs to make changes before she goes out of town tomorrow.  Pt had CBC drawn yesterday at [place] location and it is not indicated in chart that she had blood draw. Contacted [facility], spoke with representative, she stated pt did get blood draw but for some reason it did not get sent to provider's basket. Gave her office fax to send results.  Gave pt PARAMILITARY results "stable" but provider will have to review in detail to give her the final results. Copy of fax given to medical records to send to provider's basket for review.  Pt very appreciative, no additional questions at this time.

## 2015-06-27 ENCOUNTER — Telehealth: Payer: Self-pay

## 2015-06-27 NOTE — Telephone Encounter (Signed)
Prior auth obtained for Xarelto 20mg  by [facility]. Good though 01/28/2016. [id number].

## 2015-09-15 ENCOUNTER — Ambulatory Visit: Payer: Medicare Other | Admitting: Cardiovascular Disease

## 2015-10-18 ENCOUNTER — Ambulatory Visit: Payer: Medicare Other | Admitting: Cardiovascular Disease

## 2015-10-20 ENCOUNTER — Encounter: Payer: Self-pay | Admitting: Cardiovascular Disease

## 2015-10-23 ENCOUNTER — Encounter: Payer: Self-pay | Admitting: Cardiovascular Disease

## 2015-10-23 ENCOUNTER — Ambulatory Visit (INDEPENDENT_AMBULATORY_CARE_PROVIDER_SITE_OTHER): Payer: Medicare Other | Admitting: Cardiovascular Disease

## 2015-10-23 ENCOUNTER — Other Ambulatory Visit: Payer: Self-pay | Admitting: Cardiovascular Disease

## 2015-10-23 VITALS — BP 123/78 | HR 85 | Ht 61.5 in | Wt 139.8 lb

## 2015-10-23 DIAGNOSIS — I480 Paroxysmal atrial fibrillation: Secondary | ICD-10-CM

## 2015-10-23 DIAGNOSIS — I10 Essential (primary) hypertension: Secondary | ICD-10-CM

## 2015-10-23 DIAGNOSIS — E785 Hyperlipidemia, unspecified: Secondary | ICD-10-CM

## 2015-10-23 DIAGNOSIS — L97919 Non-pressure chronic ulcer of unspecified part of right lower leg with unspecified severity: Secondary | ICD-10-CM | POA: Diagnosis not present

## 2015-10-23 NOTE — Progress Notes (Signed)
Cardiology Office Note   Date:  10/23/2015   ID:  Mary Knapp, DOB November 20, 1926, MRN [id number]  PCP:  Irven Shelling, MD  Cardiologist:   Skeet Latch, MD   Chief Complaint  Patient presents with  . New Patient (Initial Visit)    Pt states no Sx.     History of Present Illness: Mary Knapp is a 82 y.o. female with paroxysmal atrial fibrillation, hypertension, hyperlipidemia, carotid artery disease, who presents for follow-up.  Mary Knapp was previously a patient of Dr. Mare Ferrari.  She last saw Mary Knapp on 05/2015.  At that appointment she was doing well.  Mary Knapp had a normal Myoview in 2007 with no evidence of ischemia. Her LVEF was 59%. She had an echo 12/01/12 that revealed LVEF 55-60% with moderate aortic regurgitation, moderate mitral regurgitation, and severe biatrial enlargement. There was also moderate to severe tricuspid regurgitation. She wore a 30 day event monitor that revealed paroxysmal atrial fibrillation.  She denies palpitations, lightheadedness or dizziness.  She notes fatigue at times, especially later in the evening.  She denies chest pain, shortness of breath, lower extremity edema, orthopnea or PND.  Her only complaint is an ulcer on her lateral right lower leg that has not healed in the last six months.  It bleeds intermittently and is tender to touch.  She denies claudication.  Mary Knapp doesn't get much formal exercise.  She does walk and denies exertional symptoms.    Past Medical History:  Diagnosis Date  . Carotid artery disease (HCC)    nonobstructive  . Chronic anticoagulation   . H/O atrial flutter   . Hyperlipidemia   . Hypertension   . PAF (paroxysmal atrial fibrillation) ([place])   . Valvular heart disease     Past Surgical History:  Procedure Laterality Date  . childbirth     x4     Current Outpatient Prescriptions  Medication Sig Dispense Refill  . benazepril-hydrochlorthiazide (LOTENSIN HCT) 10-12.5 MG per tablet Take 0.5  tablets by mouth daily.     . Calcium Carbonate-Vitamin D (CALCIUM + D PO) Take 1 tablet by mouth 2 (two) times daily.     . Cholecalciferol (VITAMIN D PO) Take 1 tablet by mouth daily.     . Coenzyme Q10 (CO Q 10 PO) Take 1 capsule by mouth daily.     . CRESTOR 10 MG tablet Take 10 mg by mouth daily.     Marland Kitchen LORazepam (ATIVAN) 1 MG tablet Take 1 mg by mouth at bedtime.     . metoprolol succinate (TOPROL-XL) 25 MG 24 hr tablet Take 1 tablet (25 mg total) by mouth daily. 90 tablet 3  . metoprolol tartrate (LOPRESSOR) 25 MG tablet Take 12.5 mg by mouth daily.     . Multiple Vitamin (MULTIVITAMIN) tablet Take 1 tablet by mouth daily.    . Omega-3 Fatty Acids (FISH OIL PO) Take 1 tablet by mouth daily.     . rivaroxaban (XARELTO) 20 MG TABS tablet Take 1 tablet (20 mg total) by mouth daily. 90 tablet 3   No current facility-administered medications for this visit.     Allergies:   Review of patient's allergies indicates no known allergies.    Social History:  The patient  reports that she has quit smoking. Her smoking use included Cigarettes. She has never used smokeless tobacco. She reports that she does not drink alcohol or use drugs.   Family History:  The patient's family history includes Heart disease  in her father.    ROS:  Please see the history of present illness.   Otherwise, review of systems are positive for none.   All other systems are reviewed and negative.    PHYSICAL EXAM: VS:  BP 123/78   Pulse 85   Ht 5' 1.5" (1.562 m)   Wt 139 lb 12.8 oz (63.4 kg)   BMI 25.99 kg/m  , BMI Body mass index is 25.99 kg/m. GENERAL:  Well appearing HEENT:  Pupils equal round and reactive, fundi not visualized, oral mucosa unremarkable NECK:  No jugular venous distention, waveform within normal limits, carotid upstroke brisk and symmetric, no bruits, no thyromegaly LYMPHATICS:  No cervical adenopathy LUNGS:  Clear to auscultation bilaterally HEART:  Irregularly irregular.  PMI not  displaced or sustained,S1 and S2 within normal limits, no S3, no S4, no clicks, no rubs, no murmurs ABD:  Flat, positive bowel sounds normal in frequency in pitch, no bruits, no rebound, no guarding, no midline pulsatile mass, no hepatomegaly, no splenomegaly EXT:  1 plus pulses throughout, no edema, no cyanosis no clubbing SKIN:  No rashes no nodules NEURO:  Cranial nerves II through XII grossly intact, motor grossly intact throughout PSYCH:  Cognitively intact, oriented to person place and time   EKG:  EKG is ordered today. The ekg ordered today demonstrates atrial fibrillation. Rate 85 bpm. PVC. Inferior T wave abnormalities.  Carotid Doppler 12/27/14: 60-79% right internal carotid artery stenosis. 1-39% left internal carotid stenosis.  Echo 12/01/12: Study Conclusions  - Left ventricle: The cavity size was normal. Wall thickness was normal. Systolic function was normal. The estimated ejection fraction was in the range of 55% to 60%. - Aortic valve: Mild regurgitation. - Mitral valve: Moderate regurgitation. - Left atrium: The atrium was moderately to severely dilated. - Right atrium: The atrium was moderately to severely dilated. - Atrial septum: No defect or patent foramen ovale was identified. - Tricuspid valve: Moderate-severe regurgitation. - Pulmonary arteries: PA peak pressure: 66mm Hg (S).  Recent Labs: No results found for requested labs within last 8760 hours.    Lipid Panel No results found for: CHOL, TRIG, HDL, CHOLHDL, VLDL, LDLCALC, LDLDIRECT    Wt Readings from Last 3 Encounters:  10/23/15 139 lb 12.8 oz (63.4 kg)  06/20/15 139 lb 6.4 oz (63.2 kg)  12/21/14 138 lb 12.8 oz (63 kg)      ASSESSMENT AND PLAN:  # Paroxysmal atrial fibrillation: Mary Knapp remains in atrial fibrillation.  She has been feeling well other than fatigue at the end of the day.  It isn't clear that this is attributable to her atrial fibrillation.  We weighed the options  to restore sinus rhythm and she decided that she would rather continue with her current management.  Continue metoprolol and anticoagulation with Xarelto.  This patients CHA2DS2-VASc Score and unadjusted Ischemic Stroke Rate (% per year) is equal to 4.8 % stroke rate/year from a score of 4  Above score calculated as 1 point each if present [CHF, HTN, DM, Vascular=MI/PAD/Aortic Plaque, Age if 65-74, or Female] Above score calculated as 2 points each if present [Age > 75, or Stroke/TIA/TE]  # Hypertension: Blood pressure is well-controlled.  Continue benazepril, HCTZ, and metoprolol.   # Hyperlipidemia: Continue rosuvastatin.  # Leg ulcer: Mary Knapp has a non-healing R LE wound.  She has 1+ DP/PT pulses on that side.  We will get ABIs.  Current medicines are reviewed at length with the patient today.  The patient does not  have concerns regarding medicines.  The following changes have been made:  no change  Labs/ tests ordered today include:   Orders Placed This Encounter  Procedures  . EKG 12-Lead     Disposition:   FU with  C. Oval Linsey, MD, [facility] in 6 months.     This note was written with the assistance of speech recognition software.  Please excuse any transcriptional errors.  Signed,  C. Oval Linsey, MD, [facility]  10/23/2015 5:32 PM    [place]

## 2015-10-23 NOTE — Patient Instructions (Signed)
Medication Instructions:  Your physician recommends that you continue on your current medications as directed. Please refer to the Current Medication list given to you today.  Labwork: none  Testing/Procedures: Your physician has requested that you have an ankle brachial index (ABI). During this test an ultrasound and blood pressure cuff are used to evaluate the arteries that supply the arms and legs with blood. Allow thirty minutes for this exam. There are no restrictions or special instructions.  Follow-Up: Your physician wants you to follow-up in: 6 month ov  You will receive a reminder letter in the mail two months in advance. If you don't receive a letter, please call our office to schedule the follow-up appointment.  If you need a refill on your cardiac medications before your next appointment, please call your pharmacy.  

## 2015-11-02 ENCOUNTER — Encounter (HOSPITAL_COMMUNITY): Payer: Medicare Other

## 2015-11-06 ENCOUNTER — Ambulatory Visit (HOSPITAL_COMMUNITY)
Admission: RE | Admit: 2015-11-06 | Discharge: 2015-11-06 | Disposition: A | Discharge status: Home / Self Care | Payer: Medicare Other | Source: Ambulatory Visit | Attending: Cardiovascular Disease | Admitting: Cardiovascular Disease

## 2015-11-06 DIAGNOSIS — L97919 Non-pressure chronic ulcer of unspecified part of right lower leg with unspecified severity: Secondary | ICD-10-CM

## 2015-11-06 DIAGNOSIS — I708 Atherosclerosis of other arteries: Secondary | ICD-10-CM | POA: Diagnosis not present

## 2015-11-15 ENCOUNTER — Telehealth: Payer: Self-pay | Admitting: Cardiovascular Disease

## 2015-11-15 NOTE — Telephone Encounter (Signed)
Patient received a letter to schedule an ankle brachial index (ABI) and is wondering if she really needs this. Please advise patient.Thanks

## 2015-11-17 NOTE — Telephone Encounter (Signed)
Advised patient 1 year follow up for carotid was recommended but she wanted to know if it was necessary. Explained it was recommended but would forward to Dr Oval Linsey for review as requested.

## 2015-11-23 NOTE — Telephone Encounter (Signed)
The carotid Doppler is to make sure that her known  Carotid disease isn't getting to the point that we need to do something about it.  The ABIs are to determine why her ulcer won't heal.  It is up to her whether she wants to do the studies, but it is my recommendation that she does.

## 2015-11-24 NOTE — Telephone Encounter (Signed)
Advised patient

## 2015-11-27 ENCOUNTER — Other Ambulatory Visit: Payer: Self-pay | Admitting: *Deleted

## 2015-11-27 DIAGNOSIS — I6529 Occlusion and stenosis of unspecified carotid artery: Secondary | ICD-10-CM

## 2015-12-06 ENCOUNTER — Inpatient Hospital Stay (HOSPITAL_COMMUNITY): Admission: RE | Admit: 2015-12-06 | Payer: Medicare Other | Source: Ambulatory Visit

## 2015-12-15 ENCOUNTER — Ambulatory Visit (HOSPITAL_COMMUNITY)
Admission: RE | Admit: 2015-12-15 | Discharge: 2015-12-15 | Disposition: A | Discharge status: Home / Self Care | Payer: Medicare Other | Source: Ambulatory Visit | Attending: Cardiovascular Disease | Admitting: Cardiovascular Disease

## 2015-12-15 DIAGNOSIS — E785 Hyperlipidemia, unspecified: Secondary | ICD-10-CM | POA: Insufficient documentation

## 2015-12-15 DIAGNOSIS — I6523 Occlusion and stenosis of bilateral carotid arteries: Secondary | ICD-10-CM | POA: Insufficient documentation

## 2015-12-15 DIAGNOSIS — Z87891 Personal history of nicotine dependence: Secondary | ICD-10-CM | POA: Diagnosis not present

## 2015-12-15 DIAGNOSIS — I6529 Occlusion and stenosis of unspecified carotid artery: Secondary | ICD-10-CM | POA: Diagnosis present

## 2015-12-15 DIAGNOSIS — I10 Essential (primary) hypertension: Secondary | ICD-10-CM | POA: Diagnosis not present

## 2015-12-25 ENCOUNTER — Other Ambulatory Visit: Payer: Self-pay

## 2015-12-25 MED ORDER — RIVAROXABAN 20 MG PO TABS: 20.0000 mg | ORAL_TABLET | Freq: Every day | ORAL | 3 refills | Status: DC

## 2016-03-26 ENCOUNTER — Other Ambulatory Visit: Payer: Self-pay | Admitting: *Deleted

## 2016-03-26 MED ORDER — METOPROLOL SUCCINATE ER 25 MG PO TB24: 25.0000 mg | ORAL_TABLET | Freq: Every day | ORAL | 1 refills | Status: DC

## 2016-08-13 ENCOUNTER — Encounter: Payer: Self-pay | Admitting: Cardiovascular Disease

## 2016-08-13 ENCOUNTER — Ambulatory Visit (INDEPENDENT_AMBULATORY_CARE_PROVIDER_SITE_OTHER): Payer: Medicare Other | Admitting: Cardiovascular Disease

## 2016-08-13 VITALS — BP 138/80 | HR 81 | Ht 61.5 in | Wt 134.8 lb

## 2016-08-13 DIAGNOSIS — I480 Paroxysmal atrial fibrillation: Secondary | ICD-10-CM | POA: Diagnosis not present

## 2016-08-13 DIAGNOSIS — Z79899 Other long term (current) drug therapy: Secondary | ICD-10-CM

## 2016-08-13 NOTE — Progress Notes (Signed)
Cardiology Office Note   Date:  08/13/2016   ID:  Mary Knapp, DOB 01/31/1926, MRN [id number]  PCP:  Lavone Orn, MD  Cardiologist:   Skeet Latch, MD   No chief complaint on file.   History of Present Illness: Mary Knapp is a 83 y.o. female with paroxysmal atrial fibrillation, hypertension, hyperlipidemia, carotid artery disease, who presents for follow-up.  Mary Knapp had a normal Myoview in 2007 with no evidence of ischemia. Her LVEF was 59%. She had an echo 12/01/12 that revealed LVEF 55-60% with moderate aortic regurgitation, moderate mitral regurgitation, and severe biatrial enlargement. There was also moderate to severe tricuspid regurgitation. She wore a 30 day event monitor that revealed paroxysmal atrial fibrillation.  She is asymptomatic. At her last appointment she only complained of fatigue later in the day. After discussion of possibilities to restore sinus rhythm, she elected to continue with current rate control therapy.  Since her last appointment Mary Knapp has been doing well.  She complains of some mild pain in her R shoulder.  She follows up with Dr. Gladstone Lighter who recommended surgery, but she is not interested.  She does report fatigue and palpitations, especially at night. She has a hard time falling asleep at night.  She enjoys having guests over every Sunday.  She enjoys the company of her "95 year boyfriend" and likes to stay busy. She doesn't get any formal exercise but denies any chest pain or shortness of breath with exertion. She also denies lower extremity edema, orthopnea, or PND. She notes palpitations very occasionally. She continues to complain of fatigue and irritability.   Past Medical History:  Diagnosis Date  . Carotid artery disease (HCC)    nonobstructive  . Chronic anticoagulation   . H/O atrial flutter   . Hyperlipidemia   . Hypertension   . PAF (paroxysmal atrial fibrillation) ([place])   . Valvular heart disease     Past Surgical  History:  Procedure Laterality Date  . childbirth     x4     Current Outpatient Prescriptions  Medication Sig Dispense Refill  . benazepril-hydrochlorthiazide (LOTENSIN HCT) 10-12.5 MG per tablet Take 0.5 tablets by mouth daily.     . CRESTOR 10 MG tablet Take 10 mg by mouth daily.     Marland Kitchen LORazepam (ATIVAN) 1 MG tablet Take 1 mg by mouth at bedtime.     . metoprolol tartrate (LOPRESSOR) 25 MG tablet Take 12.5 mg by mouth daily.     . rivaroxaban (XARELTO) 20 MG TABS tablet Take 1 tablet (20 mg total) by mouth daily. 90 tablet 3   No current facility-administered medications for this visit.     Allergies:   Patient has no known allergies.    Social History:  The patient  reports that she has quit smoking. Her smoking use included Cigarettes. She has never used smokeless tobacco. She reports that she does not drink alcohol or use drugs.   Family History:  The patient's family history includes Heart disease in her father.    ROS:  Please see the history of present illness.   Otherwise, review of systems are positive for none.   All other systems are reviewed and negative.    PHYSICAL EXAM: VS:  BP 138/80   Pulse 81   Ht 5' 1.5" (1.562 m)   Wt 61.1 kg (134 lb 12.8 oz)   BMI 25.06 kg/m  , BMI Body mass index is 25.06 kg/m. GENERAL:  Well appearing HEENT:  Pupils equal round and reactive, fundi not visualized, oral mucosa unremarkable NECK:  No jugular venous distention, waveform within normal limits, carotid upstroke brisk and symmetric, no bruits, no thyromegaly LUNGS:  Clear to auscultation bilaterally HEART:  RRR.  PMI not displaced or sustained,S1 and S2 within normal limits, no S3, no S4, no clicks, no rubs, no murmurs ABD:  Flat, positive bowel sounds normal in frequency in pitch, no bruits, no rebound, no guarding, no midline pulsatile mass, no hepatomegaly, no splenomegaly EXT:  2 plus pulses throughout, no edema, no cyanosis no clubbing SKIN:  No rashes no  nodules NEURO:  Cranial nerves II through XII grossly intact, motor grossly intact throughout PSYCH:  Cognitively intact, oriented to person place and time   EKG:  EKG is ordered today. The ekg ordered today demonstrates atrial fibrillation. Rate 85 bpm. PVC. Inferior T wave abnormalities. 08/13/16: Atrial fibrillation. Rate 81 bpm. Nonspecific T wave abnormalities. PVCs.  Carotid Doppler 12/27/14: 60-79% right internal carotid artery stenosis. 1-39% left internal carotid stenosis.  Echo 12/01/12: Study Conclusions  - Left ventricle: The cavity size was normal. Wall thickness was normal. Systolic function was normal. The estimated ejection fraction was in the range of 55% to 60%. - Aortic valve: Mild regurgitation. - Mitral valve: Moderate regurgitation. - Left atrium: The atrium was moderately to severely dilated. - Right atrium: The atrium was moderately to severely dilated. - Atrial septum: No defect or patent foramen ovale was identified. - Tricuspid valve: Moderate-severe regurgitation. - Pulmonary arteries: PA peak pressure: 87mm Hg (S).  Recent Labs: No results found for requested labs within last 8760 hours.    Lipid Panel No results found for: CHOL, TRIG, HDL, CHOLHDL, VLDL, LDLCALC, LDLDIRECT    Wt Readings from Last 3 Encounters:  08/13/16 61.1 kg (134 lb 12.8 oz)  10/23/15 63.4 kg (139 lb 12.8 oz)  06/20/15 63.2 kg (139 lb 6.4 oz)      ASSESSMENT AND PLAN:  # Paroxysmal atrial fibrillation: [name] remains in atrial fibrillation.  She continues to be fatigued but isn't interested in cardioversion.  Continue metoprolol and anticoagulation with Xarelto. Check a CBC and basic metabolic panel today.   This patients CHA2DS2-VASc Score and unadjusted Ischemic Stroke Rate (% per year) is equal to 4.8 % stroke rate/year from a score of 4  Above score calculated as 1 point each if present [CHF, HTN, DM, Vascular=MI/PAD/Aortic Plaque, Age if 65-74, or  Female] Above score calculated as 2 points each if present [Age > 75, or Stroke/TIA/TE]  # Hypertension: Blood pressure is  reasonably well-controlled given her age.  Continue benazepril, HCTZ, and metoprolol.   # Hyperlipidemia: Continue rosuvastatin.She needs fasting lipids and follow-up.   Current medicines are reviewed at length with the patient today.  The patient does not have concerns regarding medicines.  The following changes have been made:  no change  Labs/ tests ordered today include:   No orders of the defined types were placed in this encounter.    Disposition:   FU with  C. Oval Linsey, MD, [facility] in 6 months.     This note was written with the assistance of speech recognition software.  Please excuse any transcriptional errors.  Signed,  C. Oval Linsey, MD, [facility]  08/13/2016 2:45 PM    [facility]

## 2016-08-13 NOTE — Patient Instructions (Addendum)
Medication Instructions:  Your physician recommends that you continue on your current medications as directed. Please refer to the Current Medication list given to you today.  Labwork: CBC/BMET TODAY  Testing/Procedures: NONE  Follow-Up: Your physician wants you to follow-up in: [place] will receive a reminder letter in the mail two months in advance. If you don't receive a letter, please call our office to schedule the follow-up appointment.  If you need a refill on your cardiac medications before your next appointment, please call your pharmacy.

## 2016-08-14 LAB — CBC WITH DIFFERENTIAL/PLATELET
BASOS: 0 %
Basophils Absolute: 0 10*3/uL (ref 0.0–0.2)
EOS (ABSOLUTE): 0.1 10*3/uL (ref 0.0–0.4)
Eos: 2 %
HEMATOCRIT: 43.5 % (ref 34.0–46.6)
Hemoglobin: 14.1 g/dL (ref 11.1–15.9)
IMMATURE GRANULOCYTES: 0 %
Immature Grans (Abs): 0 10*3/uL (ref 0.0–0.1)
LYMPHS ABS: 1.5 10*3/uL (ref 0.7–3.1)
Lymphs: 23 %
MCH: 30.7 pg (ref 26.6–33.0)
MCHC: 32.4 g/dL (ref 31.5–35.7)
MCV: 95 fL (ref 79–97)
MONOS ABS: 1 10*3/uL — AB (ref 0.1–0.9)
Monocytes: 15 %
NEUTROS PCT: 60 %
Neutrophils Absolute: 4.1 10*3/uL (ref 1.4–7.0)
PLATELETS: 215 10*3/uL (ref 150–379)
RBC: 4.59 x10E6/uL (ref 3.77–5.28)
RDW: 14.1 % (ref 12.3–15.4)
WBC: 6.8 10*3/uL (ref 3.4–10.8)

## 2016-08-14 LAB — BASIC METABOLIC PANEL
BUN / CREAT RATIO: 21 (ref 12–28)
BUN: 16 mg/dL (ref 8–27)
CALCIUM: 9.9 mg/dL (ref 8.7–10.3)
CHLORIDE: 95 mmol/L — AB (ref 96–106)
CO2: 27 mmol/L (ref 20–29)
Creatinine, Ser: 0.76 mg/dL (ref 0.57–1.00)
GFR, EST AFRICAN AMERICAN: 81 mL/min/{1.73_m2} (ref >59)
GFR, EST NON AFRICAN AMERICAN: 70 mL/min/{1.73_m2} (ref >59)
Glucose: 70 mg/dL (ref 65–99)
POTASSIUM: 4.4 mmol/L (ref 3.5–5.2)
SODIUM: 136 mmol/L (ref 134–144)

## 2016-08-14 LAB — PLEASE NOTE

## 2016-09-14 ENCOUNTER — Other Ambulatory Visit: Payer: Self-pay | Admitting: Cardiovascular Disease

## 2016-09-16 NOTE — Telephone Encounter (Signed)
Rx(s) sent to pharmacy electronically.  

## 2016-09-16 NOTE — Telephone Encounter (Signed)
Please review for refill, thanks ! 

## 2017-01-01 ENCOUNTER — Other Ambulatory Visit: Payer: Self-pay | Admitting: Cardiovascular Disease

## 2017-01-01 NOTE — Telephone Encounter (Signed)
Please review for refill, thanks ! 

## 2017-01-29 ENCOUNTER — Other Ambulatory Visit: Payer: Self-pay | Admitting: Cardiovascular Disease

## 2017-01-31 MED ORDER — RIVAROXABAN 15 MG PO TABS: 15.0000 mg | ORAL_TABLET | Freq: Every day | ORAL | 1 refills | Status: DC

## 2017-01-31 NOTE — Telephone Encounter (Signed)
Dose changed to 15mg  due to CrCl < 24ml/min

## 2017-02-04 ENCOUNTER — Telehealth: Payer: Self-pay | Admitting: Cardiovascular Disease

## 2017-02-04 ENCOUNTER — Telehealth: Payer: Self-pay | Admitting: Cardiology

## 2017-02-04 NOTE — Telephone Encounter (Signed)
Wrong provider

## 2017-02-04 NOTE — Telephone Encounter (Signed)
SPOKE TO KIM- PHARMACIST  PER TELEPHONE NOTE STARTING ON 01/29/17- [facility] PHARMACIST SPOKE TO PATIENT AND INSTRUCTED PATIENT  THAT A DOSE CHANGE WOULD BE SENT INTO PHARMACY FOR 15 MG XARELTO  DUE to CrCL<50 ml/min. Pharmacist states prescription did not make it to pharmacy Verbal order given  xarelto 15 mg 1 tablet per supper 90 x1 refill

## 2017-03-03 ENCOUNTER — Other Ambulatory Visit (HOSPITAL_COMMUNITY): Payer: Self-pay | Admitting: Internal Medicine

## 2017-03-03 DIAGNOSIS — I6523 Occlusion and stenosis of bilateral carotid arteries: Secondary | ICD-10-CM

## 2017-03-10 ENCOUNTER — Ambulatory Visit (HOSPITAL_COMMUNITY)
Admission: RE | Admit: 2017-03-10 | Discharge: 2017-03-10 | Disposition: A | Discharge status: Home / Self Care | Payer: Medicare Other | Source: Ambulatory Visit | Attending: Internal Medicine | Admitting: Internal Medicine

## 2017-03-10 DIAGNOSIS — I10 Essential (primary) hypertension: Secondary | ICD-10-CM | POA: Diagnosis not present

## 2017-03-10 DIAGNOSIS — I6523 Occlusion and stenosis of bilateral carotid arteries: Secondary | ICD-10-CM | POA: Diagnosis present

## 2017-03-10 DIAGNOSIS — Z87891 Personal history of nicotine dependence: Secondary | ICD-10-CM | POA: Diagnosis not present

## 2017-03-10 DIAGNOSIS — E785 Hyperlipidemia, unspecified: Secondary | ICD-10-CM | POA: Insufficient documentation

## 2017-03-17 ENCOUNTER — Encounter: Payer: Self-pay | Admitting: Cardiovascular Disease

## 2017-03-17 ENCOUNTER — Ambulatory Visit: Payer: Medicare Other | Admitting: Cardiovascular Disease

## 2017-03-17 VITALS — BP 124/62 | HR 95 | Ht 61.5 in | Wt 136.8 lb

## 2017-03-17 DIAGNOSIS — I10 Essential (primary) hypertension: Secondary | ICD-10-CM

## 2017-03-17 DIAGNOSIS — I4819 Other persistent atrial fibrillation: Secondary | ICD-10-CM

## 2017-03-17 DIAGNOSIS — E7800 Pure hypercholesterolemia, unspecified: Secondary | ICD-10-CM | POA: Diagnosis not present

## 2017-03-17 DIAGNOSIS — I481 Persistent atrial fibrillation: Secondary | ICD-10-CM

## 2017-03-17 DIAGNOSIS — I6523 Occlusion and stenosis of bilateral carotid arteries: Secondary | ICD-10-CM

## 2017-03-17 NOTE — Progress Notes (Signed)
Cardiology Office Note   Date:  03/17/2017   ID:  Mary Knapp, DOB [birth year], MRN [id number]  PCP:  Lavone Orn, MD  Cardiologist:   Skeet Latch, MD   No chief complaint on file.   History of Present Illness: Mary Knapp is a 84 y.o. female with paroxysmal atrial fibrillation, hypertension, hyperlipidemia, carotid artery disease, who presents for follow-up.  [name] had a normal Myoview in 2007 with no evidence of ischemia. Her LVEF was 59%. She had an echo 12/01/12 that revealed LVEF 55-60% with moderate aortic regurgitation, moderate mitral regurgitation, and severe biatrial enlargement. There was also moderate to severe tricuspid regurgitation. She wore a 30 day event monitor that revealed paroxysmal atrial fibrillation.  She is asymptomatic.  She has complained of fatigue but afterr discussion of possibilities to restore sinus rhythm, she elected to continue with current rate control therapy.  Since her last appointment Mary Knapp has been doing well.  She complains of some mild pain in her R shoulder.  She follows up with Dr. Gladstone Lighter who recommended surgery, but she is not interested.  She does report fatigue and palpitations, especially at night. She has a hard time falling asleep at night.  She enjoys having guests over every Sunday.  She enjoys the company of her "95 year boyfriend" and likes to stay busy. She doesn't get any formal exercise but denies any chest pain or shortness of breath with exertion. She also denies lower extremity edema, orthopnea, or PND. She notes palpitations very occasionally. She continues to complain of fatigue and irritability.  At her last appointment Mary Knapp remained in atrial fibrillation.  She reported fatigue but was uninterested in cardioversion.  She continues to feel well.  She has had no chest pain or shortness of breath.  She remains very active.  She occasionally notes palpitations that typically occur when she is lying in the bed.   She denies any shortness of breath, lightheadedness, or dizziness.  Her main complaint today is insomnia.  She wonders if she can take anything to help her sleep.  She has some lower extremity edema at times it does not seem to improve with elevation of her legs.  She denies orthopnea or PND.  She has GERD that improves with Tums.   Past Medical History:  Diagnosis Date  . Carotid artery disease (HCC)    nonobstructive  . Chronic anticoagulation   . H/O atrial flutter   . Hyperlipidemia   . Hypertension   . PAF (paroxysmal atrial fibrillation) ([place])   . Valvular heart disease     Past Surgical History:  Procedure Laterality Date  . childbirth     x4     Current Outpatient Medications  Medication Sig Dispense Refill  . benazepril-hydrochlorthiazide (LOTENSIN HCT) 10-12.5 MG per tablet Take 0.5 tablets by mouth daily.     . CRESTOR 10 MG tablet Take 10 mg by mouth daily.     Marland Kitchen LORazepam (ATIVAN) 1 MG tablet Take 1 mg by mouth at bedtime.     . metoprolol succinate (TOPROL-XL) 25 MG 24 hr tablet TAKE 1 TABLET EACH DAY. 90 tablet 2  . Rivaroxaban (XARELTO) 15 MG TABS tablet Take 1 tablet (15 mg total) by mouth daily with supper. 90 tablet 1   No current facility-administered medications for this visit.     Allergies:   Patient has no known allergies.    Social History:  The patient  reports that she has quit smoking.  Her smoking use included cigarettes. she has never used smokeless tobacco. She reports that she does not drink alcohol or use drugs.   Family History:  The patient's family history includes Heart disease in her father.    ROS:  Please see the history of present illness.   Otherwise, review of systems are positive for none.   All other systems are reviewed and negative.    PHYSICAL EXAM: VS:  BP 124/62   Pulse 95   Ht 5' 1.5" (1.562 m)   Wt 136 lb 12.8 oz (62.1 kg)   SpO2 100%   BMI 25.43 kg/m  , BMI Body mass index is 25.43 kg/m. GENERAL:  Well  appearing HEENT: Pupils equal round and reactive, fundi not visualized, oral mucosa unremarkable NECK:  No jugular venous distention, waveform within normal limits, carotid upstroke brisk and symmetric, no bruits, no thyromegaly LYMPHATICS:  No cervical adenopathy LUNGS:  Clear to auscultation bilaterally HEART:  Irregularly irregular.  PMI not displaced or sustained,S1 and S2 within normal limits, no S3, no S4, no clicks, no rubs, holosystolic murmur at the apex ABD:  Flat, positive bowel sounds normal in frequency in pitch, no bruits, no rebound, no guarding, no midline pulsatile mass, no hepatomegaly, no splenomegaly EXT:  2 plus pulses throughout, no edema, no cyanosis no clubbing SKIN:  No rashes no nodules NEURO:  Cranial nerves II through XII grossly intact, motor grossly intact throughout PSYCH:  Cognitively intact, oriented to person place and time   EKG:  EKG is ordered today. The ekg ordered today demonstrates atrial fibrillation. Rate 85 bpm. PVC. Inferior T wave abnormalities. 08/13/16: Atrial fibrillation. Rate 81 bpm. Nonspecific T wave abnormalities. PVCs.  Carotid Doppler 12/27/14: 60-79% right internal carotid artery stenosis. 1-39% left internal carotid stenosis.  Echo 12/01/12: Study Conclusions  - Left ventricle: The cavity size was normal. Wall thickness was normal. Systolic function was normal. The estimated ejection fraction was in the range of 55% to 60%. - Aortic valve: Mild regurgitation. - Mitral valve: Moderate regurgitation. - Left atrium: The atrium was moderately to severely dilated. - Right atrium: The atrium was moderately to severely dilated. - Atrial septum: No defect or patent foramen ovale was identified. - Tricuspid valve: Moderate-severe regurgitation. - Pulmonary arteries: PA peak pressure: 33mm Hg (S).  Recent Labs: 08/13/2016: BUN 16; Creatinine, Ser 0.76; Hemoglobin 14.1; Platelets 215; Potassium 4.4; Sodium 136    Lipid  Panel No results found for: CHOL, TRIG, HDL, CHOLHDL, VLDL, LDLCALC, LDLDIRECT    Wt Readings from Last 3 Encounters:  03/17/17 136 lb 12.8 oz (62.1 kg)  08/13/16 134 lb 12.8 oz (61.1 kg)  10/23/15 139 lb 12.8 oz (63.4 kg)      ASSESSMENT AND PLAN:  # Persistent atrial fibrillation: Mary Knapp remains in atrial fibrillation.  She continues to be fatigued but isn't interested in cardioversion.  Continue metoprolol and anticoagulation with Xarelto. This patients CHA2DS2-VASc Score and unadjusted Ischemic Stroke Rate (% per year) is equal to 4.8 % stroke rate/year from a score of 4  Above score calculated as 1 point each if present [CHF, HTN, DM, Vascular=MI/PAD/Aortic Plaque, Age if 65-74, or Female] Above score calculated as 2 points each if present [Age > 75, or Stroke/TIA/TE]  # Hypertension: Blood pressure is well-controlled. Continue benazepril, HCTZ, and metoprolol.   # Hyperlipidemia: Continue rosuvastatin.  She just had labs with her PCP.  We will get a copy.    Current medicines are reviewed at length with the patient  today.  The patient does not have concerns regarding medicines.  The following changes have been made:  no change  Labs/ tests ordered today include:   No orders of the defined types were placed in this encounter.    Disposition:   FU with  C. Oval Linsey, MD, [facility] in 1 year   This note was written with the assistance of speech recognition software.  Please excuse any transcriptional errors.  Signed,  C. Oval Linsey, MD, [facility]  03/17/2017 3:47 PM    [facility]

## 2017-03-17 NOTE — Patient Instructions (Signed)

## 2017-04-24 ENCOUNTER — Other Ambulatory Visit: Payer: Self-pay | Admitting: Cardiovascular Disease

## 2017-04-24 NOTE — Telephone Encounter (Signed)
Please review for refill. Thanks!  

## 2017-04-25 NOTE — Telephone Encounter (Signed)
REFILL 

## 2017-06-25 ENCOUNTER — Telehealth: Payer: Self-pay | Admitting: Cardiovascular Disease

## 2017-06-25 NOTE — Telephone Encounter (Signed)
Patient called to report that she didn't take benazepril & toprol for 2 days (Mon and Tuesday) and began to feel bad, was in a-fib, sleepy and no energy. BP now 132/83 & HR 74. Took 2 lotensin last night and now is back on regular schedule with medications. Patient said that she feels fine now. Encouraged compliance with medications. Verbalized understanding of plan.

## 2017-06-25 NOTE — Telephone Encounter (Signed)
New Message    Pt c/o BP issue:  1. What are your last 5 BP readings? 185/105 2. Are you having any other symptoms (ex. Dizziness, headache, blurred vision, passed out)? clamy and lightheaded 3. What is your medication issue? None   Patient c/o Palpitations:  High priority if patient c/o lightheadedness, shortness of breath, or chest pain  1) How long have you had palpitations/irregular HR/ Afib? Are you having the symptoms now? One day, not having a symptoms now   2) Are you currently experiencing lightheadedness, SOB or CP? lightheaded  3) Do you have a history of afib (atrial fibrillation) or irregular heart rhythm? Yes, for about 10 years  4) Have you checked your BP or HR? (document readings if available): BP 185/105  5) Are you experiencing any other symptoms?    Patient says that she lives near Dr. Meda Coffee and was told to double her benazepril-hydrochlorthiazide and she felt much better. She ask when you call back to call after 3:30pm.

## 2017-06-26 NOTE — Telephone Encounter (Signed)
Agree.  Please take meds as prescribed.

## 2017-07-02 ENCOUNTER — Telehealth: Payer: Self-pay | Admitting: *Deleted

## 2017-07-02 NOTE — Telephone Encounter (Signed)
   [facility] Pre-operative Risk Assessment    Request for surgical clearance:  1. What type of surgery is being performed? PTOSIS REPAIR   2. When is this surgery scheduled? 07/28/2017   3. What type of clearance is required (medical clearance vs. Pharmacy clearance to hold med vs. Both)? PHARMACY  4. Are there any medications that need to be held prior to surgery and how long?XARELTO FOR 14 DAYS PRIOR   5. Practice name and name of physician performing surgery? DR Dorena Bodo [facility]   6. What is your office phone number [phone]    7.   What is your office fax number [phone]  8.   Anesthesia type (None, local, MAC, general) ? IV SEDATION   Fredia Beets 07/02/2017, 4:48 PM  _________________________________________________________________   (provider comments below)

## 2017-07-03 NOTE — Telephone Encounter (Signed)
   Primary Cardiologist: Skeet Latch, MD  Chart reviewed as part of pre-operative protocol coverage. H/o paroxysmal atrial fibrillation, hypertension, hyperlipidemia, carotid artery disease, MR, TR, atrial flutter. Last visit 02/2017, remained in atrial fib but not interested in DCCV. Labs stable. No med changes made. Not clear why request came in to hold Xarelto 14 days, this seems excessive. Will route to pharm for input on how long we can safely hold.   RCRI is calculated at 0.4% indicating low risk of cardiac complications. Called patient at home. She was very lively, had a lot to talk about and filled me in on her recent social interactions, family dynamics and medical endeavors. Lives nearby Dr. Meda Coffee. Daughter is a Marine scientist, another daughter is a Tax adviser. It was difficult to redirect her at times back to our medical conversation. She recently ran out of her meds for a few days and felt more symptomatic with her afib but got back on them and feels great. Therefore  Given past medical history and time since last visit, based on ACC/AHA guidelines, Mary Knapp would be at acceptable risk for the planned procedure without further cardiovascular testing.   Await pharm input then would recommend to route clearance with finalized anticoag suggestion to requesting party.  Charlie Pitter, PA-C 07/03/2017, 5:05 PM

## 2017-07-04 NOTE — Telephone Encounter (Signed)
   Primary Cardiologist: Skeet Latch, MD  Chart reviewed and patient contacted by Melina Copa PA 07/03/17 as part of pre-operative protocol coverage. Given past medical history and time since last visit, based on ACC/AHA guidelines, Mary Knapp would be at acceptable risk for the planned procedure without further cardiovascular testing.   Per pharmacy based on patient's age, CHADS score and our office protocol she can hold Xarelto 48-72 hours pre op and resume 48-72 hours post op.   I will route this recommendation to the requesting party via Epic fax function and remove from pre-op pool.  Please call with questions.  Kerin Ransom, PA-C 07/04/2017, 1:11 PM

## 2017-07-04 NOTE — Telephone Encounter (Signed)
Patient with diagnosis of atrial fibrillation on Xarelto for anticoagulation.    Procedure: ptosis repair Date of procedure: 07/28/2017  CHADS2-VASc score of  4 (, HTN, AGE, , AGE, female)  CrCl 49.2 Platelet count 215  Per office protocol, patient can hold Xarelto for 3 days prior to procedure.    Patient should restart Xarelto 48-72 hours after procedure, at discretion of procedure MD

## 2017-07-13 ENCOUNTER — Other Ambulatory Visit: Payer: Self-pay | Admitting: Cardiovascular Disease

## 2018-01-07 ENCOUNTER — Other Ambulatory Visit: Payer: Self-pay | Admitting: Cardiovascular Disease

## 2018-03-27 ENCOUNTER — Encounter: Payer: Self-pay | Admitting: Cardiovascular Disease

## 2018-03-27 ENCOUNTER — Ambulatory Visit: Payer: Medicare Other | Admitting: Cardiovascular Disease

## 2018-03-27 VITALS — BP 92/50 | HR 75 | Ht 61.5 in | Wt 131.0 lb

## 2018-03-27 DIAGNOSIS — I4819 Other persistent atrial fibrillation: Secondary | ICD-10-CM | POA: Diagnosis not present

## 2018-03-27 DIAGNOSIS — I6523 Occlusion and stenosis of bilateral carotid arteries: Secondary | ICD-10-CM | POA: Diagnosis not present

## 2018-03-27 DIAGNOSIS — E7800 Pure hypercholesterolemia, unspecified: Secondary | ICD-10-CM | POA: Diagnosis not present

## 2018-03-27 DIAGNOSIS — I10 Essential (primary) hypertension: Secondary | ICD-10-CM | POA: Diagnosis not present

## 2018-03-27 NOTE — Progress Notes (Signed)
Cardiology Office Note   Date:  03/27/2018   ID:  Mary Knapp, DOB 1926-09-28, MRN [id number]  PCP:  Mary Orn, MD  Cardiologist:   Mary Latch, MD   No chief complaint on file.   History of Present Illness: Mary Knapp is a 85 y.o. female with paroxysmal atrial fibrillation, hypertension, hyperlipidemia, carotid artery disease, who presents for follow-up.  Mary Knapp had a normal Myoview in 2007 with no evidence of ischemia. Her LVEF was 59%. She had an echo 12/01/12 that revealed LVEF 55-60% with moderate aortic regurgitation, moderate mitral regurgitation, and severe biatrial enlargement. There was also moderate to severe tricuspid regurgitation. She wore a 30 day event monitor that revealed paroxysmal atrial fibrillation.  She is asymptomatic.  She has complained of fatigue but afterr discussion of possibilities to restore sinus rhythm, she elected to continue with current rate control therapy.  Since her last appointment Mary Knapp has been doing well.  She has noticed that she gets pain in bilateral upper arms and bilateral upper thighs.  It seems to occur randomly.  She is otherwise well.  She has no chest pain or shortness of breath.  She continues to be active and has no exertional symptoms.  She denies chest pain, shortness of breath, lower extremity edema, orthopnea or PND.  She looks forward to her 89th birthday.  Her BP at home varies from 100-150/60-90s.     Past Medical History:  Diagnosis Date  . Carotid artery disease (HCC)    nonobstructive  . Chronic anticoagulation   . H/O atrial flutter   . Hyperlipidemia   . Hypertension   . PAF (paroxysmal atrial fibrillation) ([place])   . Valvular heart disease     Past Surgical History:  Procedure Laterality Date  . childbirth     x4     Current Outpatient Medications  Medication Sig Dispense Refill  . benazepril-hydrochlorthiazide (LOTENSIN HCT) 10-12.5 MG per tablet Take 0.5 tablets by mouth daily.     .  CRESTOR 10 MG tablet Take 10 mg by mouth daily.     Marland Kitchen LORazepam (ATIVAN) 1 MG tablet Take 1 mg by mouth at bedtime.     . metoprolol succinate (TOPROL-XL) 25 MG 24 hr tablet TAKE 1 TABLET EACH DAY. 90 tablet 0  . XARELTO 15 MG TABS tablet TAKE 1 TABLET ONCE DAILY. 90 tablet 1   No current facility-administered medications for this visit.     Allergies:   Patient has no known allergies.    Social History:  The patient  reports that she has quit smoking. Her smoking use included cigarettes. She has never used smokeless tobacco. She reports that she does not drink alcohol or use drugs.   Family History:  The patient's family history includes Heart disease in her father.    ROS:  Please see the history of present illness.   Otherwise, review of systems are positive for none.   All other systems are reviewed and negative.    PHYSICAL EXAM: VS:  BP (!) 92/50   Pulse 75   Ht 5' 1.5" (1.562 m)   Wt 131 lb (59.4 kg)   SpO2 96%   BMI 24.35 kg/m  , BMI Body mass index is 24.35 kg/m. GENERAL:  Well appearing HEENT: Pupils equal round and reactive, fundi not visualized, oral mucosa unremarkable NECK:  No jugular venous distention, waveform within normal limits, carotid upstroke brisk and symmetric, no bruits, no thyromegaly LYMPHATICS:  No cervical adenopathy  LUNGS:  Clear to auscultation bilaterally HEART:  Irregularly irregular.  PMI not displaced or sustained,S1 and S2 within normal limits, no S3, no S4, no clicks, no rubs, holosystolic murmur at the apex ABD:  Flat, positive bowel sounds normal in frequency in pitch, no bruits, no rebound, no guarding, no midline pulsatile mass, no hepatomegaly, no splenomegaly EXT:  2 plus pulses throughout, no edema, no cyanosis no clubbing SKIN:  No rashes no nodules NEURO:  Cranial nerves II through XII grossly intact, motor grossly intact throughout PSYCH:  Cognitively intact, oriented to person place and time   EKG:  EKG is ordered today. The  ekg ordered today demonstrates atrial fibrillation. Rate 85 bpm. PVC. Inferior T wave abnormalities. 08/13/16: Atrial fibrillation. Rate 81 bpm. Nonspecific T wave abnormalities. PVCs. 228/20: Atrial fibrillation.  Rate 75 bpm.  Nonspecific T wave abnormalities.   Carotid Doppler 12/27/14: 60-79% right internal carotid artery stenosis. 1-39% left internal carotid stenosis.  Echo 12/01/12: Study Conclusions  - Left ventricle: The cavity size was normal. Wall thickness was normal. Systolic function was normal. The estimated ejection fraction was in the range of 55% to 60%. - Aortic valve: Mild regurgitation. - Mitral valve: Moderate regurgitation. - Left atrium: The atrium was moderately to severely dilated. - Right atrium: The atrium was moderately to severely dilated. - Atrial septum: No defect or patent foramen ovale was identified. - Tricuspid valve: Moderate-severe regurgitation. - Pulmonary arteries: PA peak pressure: 4mm Hg (S).  Recent Labs: No results found for requested labs within last 8760 hours.    Lipid Panel No results found for: CHOL, TRIG, HDL, CHOLHDL, VLDL, LDLCALC, LDLDIRECT    Wt Readings from Last 3 Encounters:  03/27/18 131 lb (59.4 kg)  03/17/17 136 lb 12.8 oz (62.1 kg)  08/13/16 134 lb 12.8 oz (61.1 kg)      ASSESSMENT AND PLAN:  # Persistent atrial fibrillation: Mary Knapp remains in atrial fibrillation.  Rates are well-controlled.  Continue metoprolol and anticoagulation with Xarelto. This patients CHA2DS2-VASc Score and unadjusted Ischemic Stroke Rate (% per year) is equal to 4.8 % stroke rate/year from a score of 4  Above score calculated as 1 point each if present [CHF, HTN, DM, Vascular=MI/PAD/Aortic Plaque, Age if 65-74, or Female] Above score calculated as 2 points each if present [Age > 75, or Stroke/TIA/TE]  # Hypertension: Blood pressure is well-controlled. Continue benazepril, HCTZ, and metoprolol.   #Carotid stenosis: #  Hyperlipidemia:  She has mild ICA stenosis on the L and moderate on the R 02/2017.  She reports myalgias that may be attributable to rosuvastatin.  She will hold it for one month and see if this helps.  If her symptoms improve we will try a different statin or reduced dose.      Current medicines are reviewed at length with the patient today.  The patient does not have concerns regarding medicines.  The following changes have been made:  no change  Labs/ tests ordered today include:   No orders of the defined types were placed in this encounter.    Disposition:   FU with  C. Oval Linsey, MD, [facility] in 1 year     Signed,  C. Oval Linsey, MD, [facility]  03/27/2018 11:56 AM    [place]

## 2018-03-27 NOTE — Patient Instructions (Signed)
Medication Instructions:  HOLD YOUR ROSUVASTATIN X 1 MONTH CALL IN ABOUT A MONTH WITH AN UPDATE ON HOW YOU ARE DOING    If you need a refill on your cardiac medications before your next appointment, please call your pharmacy.   Lab work: NONE  Testing/Procedures: NONE  Follow-Up: At [facility], you and your health needs are our priority.  As part of our continuing mission to provide you with exceptional heart care, we have created designated Provider Care Teams.  These Care Teams include your primary Cardiologist (physician) and Advanced Practice Providers (APPs -  Physician Assistants and Nurse Practitioners) who all work together to provide you with the care you need, when you need it. You will need a follow up appointment in 12 months.  Please call our office 2 months in advance to schedule this appointment.  You may see Skeet Latch, MD or one of the following Advanced Practice Providers on your designated Care Team:   Kerin Ransom, PA-C Roby Lofts, [place] . Sande Rives, PA-C

## 2018-03-29 ENCOUNTER — Encounter: Payer: Self-pay | Admitting: Cardiovascular Disease

## 2018-04-06 ENCOUNTER — Other Ambulatory Visit: Payer: Self-pay | Admitting: Cardiovascular Disease

## 2018-05-13 ENCOUNTER — Telehealth: Payer: Self-pay | Admitting: Cardiovascular Disease

## 2018-05-13 NOTE — Telephone Encounter (Signed)
New Message  Patient states she was told to call back and give an update on how she is doing and would like to speak to [place].

## 2018-05-13 NOTE — Telephone Encounter (Signed)
Message sent to email  dearest , i have had your address for quite awhile and hope it is still valid... when i was there on 03-27-18 i told dr. Oval Linsey that i had a lot of aching in my arm muscles from my elbows to my shoulders. i generaly sleep on my side and that hurts my arms, so she suggested i refrain from my crestor for 30 days. it has helped quite a bit, but not completely. at my age - 70 - i can see no reason to take it. my total cholesterol is 140...  hdl 2.7  hdld 52  trig. 128  so when you have time, run this issue by dr. Oval Linsey and call me with further info...  thank you so much and please let me know that you received this email. best regards, Mary Knapp  Will forward to Dr Oval Linsey for review

## 2018-05-15 NOTE — Telephone Encounter (Signed)
How about if she just takes 5mg  MWF?

## 2018-05-15 NOTE — Telephone Encounter (Signed)
Left message to call back  

## 2018-05-19 NOTE — Telephone Encounter (Signed)
Pt called back returning Melinda's call.

## 2018-06-01 NOTE — Telephone Encounter (Signed)
Follow Up:    Pt said she was calling back again, returning [place] call from April.She wants to give an update on the medicine she is on.

## 2018-06-03 NOTE — Telephone Encounter (Signed)
Patient is taking Rosuvastatin 10 mg 1/2 tablet daily and dong better. She will continue that dose

## 2018-07-12 ENCOUNTER — Other Ambulatory Visit: Payer: Self-pay | Admitting: Cardiovascular Disease

## 2018-08-06 ENCOUNTER — Other Ambulatory Visit: Payer: Self-pay | Admitting: Cardiovascular Disease

## 2018-08-07 NOTE — Telephone Encounter (Signed)
Pt is a 85 yr old female who last saw Dr. Oval Linsey on 03/27/18. Last noted weight was 59.4Kg on 03/27/18. SCr per [facility] from [facility] was 0.99 on 01/06/18. CrCl is 3mL/min, Will refill Xarelto 15mg  QD.

## 2018-10-11 ENCOUNTER — Other Ambulatory Visit: Payer: Self-pay | Admitting: Cardiovascular Disease

## 2018-10-12 NOTE — Telephone Encounter (Signed)
66F 59.4 kg SCr 0.9 (12/2017).  CrCl 39.  LOV TR 02/2018

## 2019-01-25 ENCOUNTER — Other Ambulatory Visit: Payer: Self-pay | Admitting: Cardiovascular Disease

## 2019-01-26 ENCOUNTER — Other Ambulatory Visit: Payer: Self-pay | Admitting: Cardiovascular Disease

## 2019-02-01 ENCOUNTER — Other Ambulatory Visit: Payer: Self-pay | Admitting: Cardiovascular Disease

## 2019-02-04 ENCOUNTER — Other Ambulatory Visit: Payer: Self-pay

## 2019-02-04 MED ORDER — RIVAROXABAN 15 MG PO TABS: 15.0000 mg | ORAL_TABLET | Freq: Every day | ORAL | 0 refills | Status: DC

## 2019-02-28 ENCOUNTER — Ambulatory Visit: Payer: Medicare Other

## 2019-03-05 ENCOUNTER — Ambulatory Visit: Payer: Medicare Other

## 2019-03-07 ENCOUNTER — Ambulatory Visit: Payer: Medicare Other

## 2019-03-29 ENCOUNTER — Other Ambulatory Visit: Payer: Self-pay

## 2019-03-29 ENCOUNTER — Encounter: Payer: Self-pay | Admitting: Cardiovascular Disease

## 2019-03-29 ENCOUNTER — Encounter (INDEPENDENT_AMBULATORY_CARE_PROVIDER_SITE_OTHER): Payer: Self-pay

## 2019-03-29 ENCOUNTER — Ambulatory Visit: Payer: Medicare Other | Admitting: Cardiovascular Disease

## 2019-03-29 VITALS — BP 102/76 | HR 81 | Ht 61.5 in | Wt 134.0 lb

## 2019-03-29 DIAGNOSIS — I10 Essential (primary) hypertension: Secondary | ICD-10-CM | POA: Diagnosis not present

## 2019-03-29 DIAGNOSIS — I119 Hypertensive heart disease without heart failure: Secondary | ICD-10-CM

## 2019-03-29 DIAGNOSIS — I6523 Occlusion and stenosis of bilateral carotid arteries: Secondary | ICD-10-CM | POA: Diagnosis not present

## 2019-03-29 DIAGNOSIS — E7800 Pure hypercholesterolemia, unspecified: Secondary | ICD-10-CM

## 2019-03-29 DIAGNOSIS — I483 Typical atrial flutter: Secondary | ICD-10-CM | POA: Diagnosis not present

## 2019-03-29 NOTE — Progress Notes (Signed)
Cardiology Office Note   Date:  03/29/2019   ID:  ZERLINA FLAMER, DOB [birth year], MRN [id number]  PCP:  Lavone Orn, MD  Cardiologist:   Skeet Latch, MD   No chief complaint on file.   History of Present Illness: Mary Knapp is a 86 y.o. female with paroxysmal atrial fibrillation, hypertension, hyperlipidemia, carotid artery disease, who presents for follow-up.  Ms. Mundine had a normal Myoview in 2007 with no evidence of ischemia. Her LVEF was 59%. She had an echo 12/01/12 that revealed LVEF 55-60% with moderate aortic regurgitation, moderate mitral regurgitation, and severe biatrial enlargement. There was also moderate to severe tricuspid regurgitation. She wore a 30 day event monitor that revealed paroxysmal atrial fibrillation.  She is asymptomatic.  She has complained of fatigue but afterr discussion of possibilities to restore sinus rhythm, she elected to continue with current rate control therapy.  Since her last appointment Mrs. Manderfeld has been doing well.  Rosuvastatin was reduced due to cramping.  This does seem to have helped.  She has been feeling tired all the time.  She walks for exercise approximately once per week.  She can walk for 2 miles and has no chest pain or shortness of breath.  She has no palpitations, lightheadedness, or dizziness.  She has not been as socially interactive due to Covid.  She had a home screening and her right ABI was 0.42.  The left was 0.84.  She denies any pain or cramping in her legs.   Past Medical History:  Diagnosis Date  . Carotid artery disease (HCC)    nonobstructive  . Chronic anticoagulation   . H/O atrial flutter   . Hyperlipidemia   . Hypertension   . PAF (paroxysmal atrial fibrillation) ([place])   . Valvular heart disease     Past Surgical History:  Procedure Laterality Date  . childbirth     x4     Current Outpatient Medications  Medication Sig Dispense Refill  . benazepril-hydrochlorthiazide (LOTENSIN HCT) 10-12.5  MG per tablet Take 0.5 tablets by mouth daily.     . CRESTOR 10 MG tablet Take 10 mg by mouth as directed. Take 1/2 tablet daily    . LORazepam (ATIVAN) 1 MG tablet Take 1 mg by mouth at bedtime.     . metoprolol succinate (TOPROL-XL) 25 MG 24 hr tablet TAKE 1 TABLET EACH DAY. 90 tablet 2  . Rivaroxaban (XARELTO) 15 MG TABS tablet Take 1 tablet (15 mg total) by mouth daily. 83 tablet 0   No current facility-administered medications for this visit.    Allergies:   Patient has no known allergies.    Social History:  The patient  reports that she has quit smoking. Her smoking use included cigarettes. She has never used smokeless tobacco. She reports that she does not drink alcohol or use drugs.   Family History:  The patient's family history includes Heart disease in her father.    ROS:  Please see the history of present illness.   Otherwise, review of systems are positive for none.   All other systems are reviewed and negative.    PHYSICAL EXAM: VS:  BP 102/76   Pulse 81   Ht 5' 1.5" (1.562 m)   Wt 134 lb (60.8 kg)   SpO2 96%   BMI 24.91 kg/m  , BMI Body mass index is 24.91 kg/m. GENERAL:  Well appearing HEENT: Pupils equal round and reactive, fundi not visualized, oral mucosa unremarkable NECK:  No  jugular venous distention, waveform within normal limits, carotid upstroke brisk and symmetric, no bruits, no thyromegaly LYMPHATICS:  No cervical adenopathy LUNGS:  Clear to auscultation bilaterally HEART:  Irregularly irregular.  PMI not displaced or sustained,S1 and S2 within normal limits, no S3, no S4, no clicks, no rubs, no murmurs ABD:  Flat, positive bowel sounds normal in frequency in pitch, no bruits, no rebound, no guarding, no midline pulsatile mass, no hepatomegaly, no splenomegaly EXT:  2 plus pulses throughout, no edema, no cyanosis no clubbing SKIN:  No rashes no nodules NEURO:  Cranial nerves II through XII grossly intact, motor grossly intact throughout PSYCH:   Cognitively intact, oriented to person place and time   EKG:  EKG is ordered today. The ekg ordered today demonstrates atrial fibrillation. Rate 85 bpm. PVC. Inferior T wave abnormalities. 08/13/16: Atrial fibrillation. Rate 81 bpm. Nonspecific T wave abnormalities. PVCs. 03/27/18: Atrial fibrillation.  Rate 75 bpm.  Nonspecific T wave abnormalities.  03/29/19: Atrial fibrillation.  Rate 81 bpm.  Nonspecific T wave abnormalies.    Carotid Doppler 12/27/14: 60-79% right internal carotid artery stenosis. 1-39% left internal carotid stenosis.  Echo 12/01/12: Study Conclusions  - Left ventricle: The cavity size was normal. Wall thickness was normal. Systolic function was normal. The estimated ejection fraction was in the range of 55% to 60%. - Aortic valve: Mild regurgitation. - Mitral valve: Moderate regurgitation. - Left atrium: The atrium was moderately to severely dilated. - Right atrium: The atrium was moderately to severely dilated. - Atrial septum: No defect or patent foramen ovale was identified. - Tricuspid valve: Moderate-severe regurgitation. - Pulmonary arteries: PA peak pressure: 28mm Hg (S).  Recent Labs: No results found for requested labs within last 8760 hours.    Lipid Panel No results found for: CHOL, TRIG, HDL, CHOLHDL, VLDL, LDLCALC, LDLDIRECT    Wt Readings from Last 3 Encounters:  03/29/19 134 lb (60.8 kg)  03/27/18 131 lb (59.4 kg)  03/17/17 136 lb 12.8 oz (62.1 kg)      ASSESSMENT AND PLAN:  # Persistent atrial fibrillation: Ms. Payan remains in atrial fibrillation.  Rates are well-controlled.  She is asymptomatic.  Continue metoprolol and anticoagulation with Xarelto. This patients CHA2DS2-VASc Score and unadjusted Ischemic Stroke Rate (% per year) is equal to 4.8 % stroke rate/year from a score of 4  Above score calculated as 1 point each if present [CHF, HTN, DM, Vascular=MI/PAD/Aortic Plaque, Age if 65-74, or Female] Above score  calculated as 2 points each if present [Age > 75, or Stroke/TIA/TE]  # Hypertension: Blood pressure remains well-controlled.  It is a little low but she has no lightheadedness or dizziness.  Continue benazepril, hydrochlorothiazide, and metoprolol.  # Carotid stenosis: # Hyperlipidemia:  She has mild ICA stenosis on the L and moderate on the R 02/2017.  She reports myalgias that may be attributable to rosuvastatin.  Symptoms improved with reducing rosuvastatin.  Check fasting lipids and CMP.  Repeat carotid Dopplers.  Current medicines are reviewed at length with the patient today.  The patient does not have concerns regarding medicines.  The following changes have been made:  no change  Labs/ tests ordered today include:   Orders Placed This Encounter  Procedures  . Lipid panel  . Comprehensive metabolic panel  . EKG 12-Lead  . VAS US CAROTID     Disposition:   FU with  C. Oval Linsey, MD, [facility] in 1 year     Signed,  C. Oval Linsey, MD, [facility]  03/29/2019 5:35  PM    [facility]

## 2019-03-29 NOTE — Patient Instructions (Addendum)
Medication Instructions:  Your physician recommends that you continue on your current medications as directed. Please refer to the Current Medication list given to you today.  *If you need a refill on your cardiac medications before your next appointment, please call your pharmacy*  Lab Work: FASTING LP/CMET SOON   If you have labs (blood work) drawn today and your tests are completely normal, you will receive your results only by: Marland Kitchen MyChart Message (if you have MyChart) OR . A paper copy in the mail If you have any lab test that is abnormal or we need to change your treatment, we will call you to review the results.  Testing/Procedures: Your physician has requested that you have a carotid duplex. This test is an ultrasound of the carotid arteries in your neck. It looks at blood flow through these arteries that supply the brain with blood. Allow one hour for this exam. There are no restrictions or special instructions.  Follow-Up: At [facility], you and your health needs are our priority.  As part of our continuing mission to provide you with exceptional heart care, we have created designated Provider Care Teams.  These Care Teams include your primary Cardiologist (physician) and Advanced Practice Providers (APPs -  Physician Assistants and Nurse Practitioners) who all work together to provide you with the care you need, when you need it.  We recommend signing up for the patient portal called "MyChart".  Sign up information is provided on this After Visit Summary.  MyChart is used to connect with patients for Virtual Visits (Telemedicine).  Patients are able to view lab/test results, encounter notes, upcoming appointments, etc.  Non-urgent messages can be sent to your provider as well.   To learn more about what you can do with MyChart, go to [email or web address].    Your next appointment:   12 month(s)  You will receive a reminder letter in the mail two months in advance. If you don't  receive a letter, please call our office to schedule the follow-up appointment.   The format for your next appointment:   In Person  Provider:   You may see Skeet Latch, MD or one of the following Advanced Practice Providers on your designated Care Team:    Kerin Ransom, PA-C  [place], [place]  Coletta Memos, [place]

## 2019-04-08 ENCOUNTER — Ambulatory Visit (HOSPITAL_COMMUNITY)
Admission: RE | Admit: 2019-04-08 | Discharge: 2019-04-08 | Disposition: A | Discharge status: Home / Self Care | Payer: Medicare Other | Source: Ambulatory Visit | Attending: Cardiology | Admitting: Cardiology

## 2019-04-08 ENCOUNTER — Other Ambulatory Visit: Payer: Self-pay

## 2019-04-08 DIAGNOSIS — E7800 Pure hypercholesterolemia, unspecified: Secondary | ICD-10-CM | POA: Insufficient documentation

## 2019-04-08 DIAGNOSIS — I6523 Occlusion and stenosis of bilateral carotid arteries: Secondary | ICD-10-CM | POA: Insufficient documentation

## 2019-04-09 LAB — LIPID PANEL
Chol/HDL Ratio: 2.5 ratio (ref 0.0–4.4)
Cholesterol, Total: 151 mg/dL (ref 100–199)
HDL: 60 mg/dL (ref >39)
LDL Chol Calc (NIH): 74 mg/dL (ref 0–99)
Triglycerides: 90 mg/dL (ref 0–149)
VLDL Cholesterol Cal: 17 mg/dL (ref 5–40)

## 2019-04-09 LAB — COMPREHENSIVE METABOLIC PANEL
ALT: 9 IU/L (ref 0–32)
AST: 18 IU/L (ref 0–40)
Albumin/Globulin Ratio: 1.8 (ref 1.2–2.2)
Albumin: 4.2 g/dL (ref 3.5–4.6)
Alkaline Phosphatase: 65 IU/L (ref 39–117)
BUN/Creatinine Ratio: 16 (ref 12–28)
BUN: 15 mg/dL (ref 10–36)
Bilirubin Total: 0.7 mg/dL (ref 0.0–1.2)
CO2: 26 mmol/L (ref 20–29)
Calcium: 10.1 mg/dL (ref 8.7–10.3)
Chloride: 99 mmol/L (ref 96–106)
Creatinine, Ser: 0.93 mg/dL (ref 0.57–1.00)
GFR calc Af Amer: 63 mL/min/{1.73_m2} (ref >59)
GFR calc non Af Amer: 54 mL/min/{1.73_m2} — ABNORMAL LOW (ref >59)
Globulin, Total: 2.4 g/dL (ref 1.5–4.5)
Glucose: 103 mg/dL — ABNORMAL HIGH (ref 65–99)
Potassium: 5.3 mmol/L — ABNORMAL HIGH (ref 3.5–5.2)
Sodium: 138 mmol/L (ref 134–144)
Total Protein: 6.6 g/dL (ref 6.0–8.5)

## 2019-04-15 ENCOUNTER — Telehealth: Payer: Self-pay | Admitting: Cardiovascular Disease

## 2019-04-15 NOTE — Telephone Encounter (Signed)
New Message  Patient is calling to go over her lab results. Please give patient a call back with her results.

## 2019-04-15 NOTE — Telephone Encounter (Signed)
Advised patient will call once Dr Oval Linsey reviews

## 2019-04-19 ENCOUNTER — Other Ambulatory Visit: Payer: Self-pay | Admitting: Cardiovascular Disease

## 2019-05-02 ENCOUNTER — Emergency Department (HOSPITAL_COMMUNITY): Payer: Medicare Other

## 2019-05-02 ENCOUNTER — Observation Stay (HOSPITAL_COMMUNITY)
Admission: EM | Admit: 2019-05-02 | Discharge: 2019-05-03 | Disposition: A | Discharge status: Home / Self Care | Payer: Medicare Other | Attending: Internal Medicine | Admitting: Internal Medicine

## 2019-05-02 ENCOUNTER — Other Ambulatory Visit: Payer: Self-pay

## 2019-05-02 ENCOUNTER — Encounter (HOSPITAL_COMMUNITY): Payer: Self-pay | Admitting: Emergency Medicine

## 2019-05-02 DIAGNOSIS — Z66 Do not resuscitate: Secondary | ICD-10-CM | POA: Diagnosis not present

## 2019-05-02 DIAGNOSIS — I480 Paroxysmal atrial fibrillation: Secondary | ICD-10-CM | POA: Insufficient documentation

## 2019-05-02 DIAGNOSIS — I10 Essential (primary) hypertension: Secondary | ICD-10-CM | POA: Insufficient documentation

## 2019-05-02 DIAGNOSIS — R319 Hematuria, unspecified: Secondary | ICD-10-CM

## 2019-05-02 DIAGNOSIS — E7800 Pure hypercholesterolemia, unspecified: Secondary | ICD-10-CM | POA: Insufficient documentation

## 2019-05-02 DIAGNOSIS — Z20822 Contact with and (suspected) exposure to covid-19: Secondary | ICD-10-CM | POA: Diagnosis not present

## 2019-05-02 DIAGNOSIS — Z7901 Long term (current) use of anticoagulants: Secondary | ICD-10-CM | POA: Insufficient documentation

## 2019-05-02 DIAGNOSIS — Z87891 Personal history of nicotine dependence: Secondary | ICD-10-CM | POA: Diagnosis not present

## 2019-05-02 DIAGNOSIS — E785 Hyperlipidemia, unspecified: Secondary | ICD-10-CM | POA: Diagnosis not present

## 2019-05-02 DIAGNOSIS — R310 Gross hematuria: Secondary | ICD-10-CM | POA: Diagnosis present

## 2019-05-02 DIAGNOSIS — I119 Hypertensive heart disease without heart failure: Secondary | ICD-10-CM | POA: Insufficient documentation

## 2019-05-02 DIAGNOSIS — Z79899 Other long term (current) drug therapy: Secondary | ICD-10-CM | POA: Insufficient documentation

## 2019-05-02 DIAGNOSIS — Z7982 Long term (current) use of aspirin: Secondary | ICD-10-CM | POA: Diagnosis not present

## 2019-05-02 LAB — URINALYSIS, ROUTINE W REFLEX MICROSCOPIC: Glucose, UA: NEGATIVE mg/dL

## 2019-05-02 LAB — CBC
HCT: 43.5 % (ref 36.0–46.0)
Hemoglobin: 13.7 g/dL (ref 12.0–15.0)
MCH: 30.6 pg (ref 26.0–34.0)
MCHC: 31.5 g/dL (ref 30.0–36.0)
MCV: 97.1 fL (ref 80.0–100.0)
Platelets: 165 10*3/uL (ref 150–400)
RBC: 4.48 MIL/uL (ref 3.87–5.11)
RDW: 13.2 % (ref 11.5–15.5)
WBC: 12.1 10*3/uL — ABNORMAL HIGH (ref 4.0–10.5)
nRBC: 0 % (ref 0.0–0.2)

## 2019-05-02 LAB — BASIC METABOLIC PANEL
Anion gap: 9 (ref 5–15)
BUN: 20 mg/dL (ref 8–23)
CO2: 29 mmol/L (ref 22–32)
Calcium: 9.7 mg/dL (ref 8.9–10.3)
Chloride: 99 mmol/L (ref 98–111)
Creatinine, Ser: 0.89 mg/dL (ref 0.44–1.00)
GFR calc Af Amer: >60 mL/min (ref >60)
GFR calc non Af Amer: 57 mL/min — ABNORMAL LOW (ref >60)
Glucose, Bld: 152 mg/dL — ABNORMAL HIGH (ref 70–99)
Potassium: 3.8 mmol/L (ref 3.5–5.1)
Sodium: 137 mmol/L (ref 135–145)

## 2019-05-02 LAB — URINALYSIS, MICROSCOPIC (REFLEX)
RBC / HPF: >50 RBC/hpf (ref 0–5)
WBC, UA: NONE SEEN WBC/hpf (ref 0–5)

## 2019-05-02 NOTE — Progress Notes (Signed)
   05/02/19 2201  Vitals  ECG Heart Rate (!) 115  Cardiac Rhythm Atrial fibrillation  Level of Consciousness  Level of Consciousness Alert  ECG Intervals  QRS interval 0.06  MEWS Assessment  Is this an acute change? No  Provider Notification  Provider Name/Title E. Ouma  Date Provider Notified 05/02/19  Time Provider Notified 2314  Notification Type Page  Notification Reason Other (Comment)  Response No new orders (Provider suggested to waiting for admitting orders. )  Date of Provider Response 05/02/19  Time of Provider Response [id number]  Mews turn yellow for pulse rate. Pt has HX of AFIB. Currently in AFIB with HR between 90-115. Pt takes metroprolol at home and recently was admitted. Provider paged made no new orders or changes. RN will continue to monitor pt.

## 2019-05-02 NOTE — ED Notes (Signed)
Son at bedside.

## 2019-05-02 NOTE — ED Notes (Signed)
Patient using restroom and instructed to provide UA.

## 2019-05-02 NOTE — ED Provider Notes (Signed)
[place] DEPT Provider Note   CSN: [id number] Arrival date & time: 05/02/19  1227     History CC: Blood in urine   Mary Knapp is a 86 y.o. female w/ hx of parox A Fib, HTN, HLD, on xarelto, presenting to the ED with gross hematuria.  The patient reports she woke up this morning and noted bright red blood in her diaper pad.  She says she's had urinary frequency today, and noted frank blood in her urine.  She denies blood in her stool or rectum or dark tarry stools.  She denies hx of UTI or kidney stone.  She denies hx of cancer.  She is not a smoker.  She has been on xarelto for "many many years" and never had a bleeding issue before.  She denies SOB, lightheadedness.  She walks 3 miles daily.  She feels well.  She is here with her eldest son Nada Boozer.  No hx of hysterectomy  HPI     Past Medical History:  Diagnosis Date  . Carotid artery disease (HCC)    nonobstructive  . Chronic anticoagulation   . H/O atrial flutter   . Hyperlipidemia   . Hypertension   . PAF (paroxysmal atrial fibrillation) ([place])   . Valvular heart disease     Patient Active Problem List   Diagnosis Date Noted  . Hematuria 05/02/2019  . PAF (paroxysmal atrial fibrillation) ([place]) 01/08/2013  . Pure hypercholesterolemia 01/08/2013  . Benign hypertensive heart disease without heart failure 11/30/2012  . Atrial flutter ([place]) 11/23/2012  . Vascular disease   . Carotid artery disease [facility])     Past Surgical History:  Procedure Laterality Date  . childbirth     x4     OB History   No obstetric history on file.     Family History  Problem Relation Age of Onset  . Heart disease Father     Social History   Tobacco Use  . Smoking status: Former Smoker    Types: Cigarettes  . Smokeless tobacco: Never Used  Substance Use Topics  . Alcohol use: No  . Drug use: No    Home Medications Prior to Admission medications   Medication Sig Start Date End Date  Taking? Authorizing Provider  aspirin-sod bicarb-citric acid (ALKA-SELTZER) 325 MG TBEF tablet Take 325 mg by mouth every 6 (six) hours as needed ('when I don't feel good').   Yes [provider]  benazepril-hydrochlorthiazide (LOTENSIN HCT) 10-12.5 MG per tablet Take 0.5 tablets by mouth daily.  03/24/11  Yes [provider]  CRESTOR 10 MG tablet Take 10 mg by mouth daily.  03/24/11  Yes [provider]  LORazepam (ATIVAN) 1 MG tablet Take 0.5 mg by mouth at bedtime.  06/30/13  Yes [provider]  metoprolol succinate (TOPROL-XL) 25 MG 24 hr tablet TAKE 1 TABLET EACH DAY. Patient taking differently: Take 25 mg by mouth daily.  04/19/19  Yes Skeet Latch, MD  Rivaroxaban (XARELTO) 15 MG TABS tablet Take 1 tablet (15 mg total) by mouth daily. 02/04/19  Yes Skeet Latch, MD    Allergies    Patient has no known allergies.  Review of Systems   Review of Systems  Constitutional: Negative for chills and fever.  HENT: Negative for ear pain and sore throat.   Eyes: Negative for pain and visual disturbance.  Respiratory: Negative for cough and shortness of breath.   Cardiovascular: Negative for chest pain and palpitations.  Gastrointestinal: Negative for abdominal  pain, blood in stool and rectal pain.  Genitourinary: Positive for frequency and hematuria. Negative for dysuria, flank pain, menstrual problem and pelvic pain.  Musculoskeletal: Negative for arthralgias and back pain.  Skin: Negative for color change and rash.  Neurological: Negative for syncope and light-headedness.  Psychiatric/Behavioral: Negative for agitation and confusion.  All other systems reviewed and are negative.   Physical Exam Updated Vital Signs BP (!) 143/89 (BP Location: Left Arm)   Pulse 80   Temp 98.6 F (37 C) (Oral)   Resp 18   Ht 5' 1.5" (1.562 m)   Wt 59.7 kg   SpO2 97%   BMI 24.47 kg/m   Physical Exam Vitals and nursing note reviewed.  Constitutional:       General: She is not in acute distress.    Appearance: She is well-developed.  HENT:     Head: Normocephalic and atraumatic.  Eyes:     Conjunctiva/sclera: Conjunctivae normal.  Cardiovascular:     Rate and Rhythm: Normal rate. Rhythm irregular.     Pulses: Normal pulses.  Pulmonary:     Effort: Pulmonary effort is normal. No respiratory distress.  Abdominal:     Palpations: Abdomen is soft.     Tenderness: There is no abdominal tenderness.  Musculoskeletal:     Cervical back: Neck supple.  Skin:    General: Skin is warm and dry.  Neurological:     General: No focal deficit present.     Mental Status: She is alert and oriented to person, place, and time.  Psychiatric:        Mood and Affect: Mood normal.        Behavior: Behavior normal.     ED Results / Procedures / Treatments   Labs (all labs ordered are listed, but only abnormal results are displayed) Labs Reviewed  URINALYSIS, ROUTINE W REFLEX MICROSCOPIC - Abnormal; Notable for the following components:      Result Value   Color, Urine RED (*)    APPearance CLOUDY (*)    Hgb urine dipstick   (*)    Value: TEST NOT REPORTED DUE TO COLOR INTERFERENCE OF URINE PIGMENT   Bilirubin Urine   (*)    Value: TEST NOT REPORTED DUE TO COLOR INTERFERENCE OF URINE PIGMENT   Ketones, ur   (*)    Value: TEST NOT REPORTED DUE TO COLOR INTERFERENCE OF URINE PIGMENT   Protein, ur   (*)    Value: TEST NOT REPORTED DUE TO COLOR INTERFERENCE OF URINE PIGMENT   Nitrite   (*)    Value: TEST NOT REPORTED DUE TO COLOR INTERFERENCE OF URINE PIGMENT   Leukocytes,Ua   (*)    Value: TEST NOT REPORTED DUE TO COLOR INTERFERENCE OF URINE PIGMENT   All other components within normal limits  URINALYSIS, MICROSCOPIC (REFLEX) - Abnormal; Notable for the following components:   Bacteria, UA RARE (*)    All other components within normal limits  BASIC METABOLIC PANEL - Abnormal; Notable for the following components:   Glucose, Bld 152 (*)    GFR  calc non Af Amer 57 (*)    All other components within normal limits  CBC - Abnormal; Notable for the following components:   WBC 12.1 (*)    All other components within normal limits  URINE CULTURE  SARS CORONAVIRUS 2 (TAT 6-24 HRS)    EKG None  Radiology CT Renal Stone Study  Result Date: 05/02/2019 CLINICAL DATA:  Gross hematuria. EXAM: CT ABDOMEN  AND PELVIS WITHOUT CONTRAST TECHNIQUE: Multidetector CT imaging of the abdomen and pelvis was performed following the standard protocol without IV contrast. COMPARISON:  None. FINDINGS: Lower chest: Advanced changes of interstitial lung disease. No infiltrates or effusions. Calcified granuloma noted at the right lung base. Aortic and coronary artery calcifications are noted. No pericardial effusion. Hepatobiliary: No focal hepatic lesions are identified without contrast. The gallbladder is grossly normal. No common bile duct dilatation. Pancreas: No mass, inflammation or ductal dilatation. Spleen: Normal size. No focal lesions. Adrenals/Urinary Tract: Adrenal glands and kidneys are unremarkable. No renal calculi, obvious renal mass or hydronephrosis. No obstructing ureteral calculi. Thick walled bladder possibly asymmetrically thick on the right side. A discrete bladder mass is not identified. There is high attenuation material in the bladder which could suggest hematoma. Stomach/Bowel: The stomach, duodenum, small bowel and colon are grossly normal without oral contrast. No acute inflammatory process, mass lesion or obstructive findings. The terminal ileum and appendix appear normal. Colonic diverticulosis without findings for acute diverticulitis. Vascular/Lymphatic: Advanced atherosclerotic calcifications involving the aorta and iliac arteries. No focal aneurysm. No mesenteric or retroperitoneal mass or adenopathy. Reproductive: The uterus and ovaries are unremarkable. Other: No pelvic mass or adenopathy. No free pelvic fluid collections. No inguinal  mass or adenopathy. No abdominal wall hernia or subcutaneous lesions. Musculoskeletal: No significant bony findings. Age related degenerative changes involving the spine and hips IMPRESSION: 1. No renal, ureteral or bladder calculi. No obvious renal or bladder mass. 2. Thick walled bladder with high attenuation material in the bladder which could suggest hematoma. A discrete bladder mass is not identified without contrast. 3. Advanced pulmonary interstitial lung disease. 4. Advanced atherosclerotic calcifications involving the aorta and iliac arteries. 5. Colonic diverticulosis without findings for acute diverticulitis. Aortic Atherosclerosis (ICD10-I70.0). Electronically Signed   By: Marijo Sanes M.D.   On: 05/02/2019 13:37    Procedures Procedures (including critical care time)  Medications Ordered in ED Medications - No data to display  ED Course  I have reviewed the triage vital signs and the nursing notes.  Pertinent labs & imaging results that were available during my care of the patient were reviewed by me and considered in my medical decision making (see chart for details).  This patient complains of painless hematuria .  This involves an extensive number of treatment options, and is a complaint that carries with it a high risk of complications and morbidity.  The differential diagnosis includes cystitis vs. Bladder cancer vs. Spontaneous GU hemorrhage vs. Kidney stone vs. other  I ordered, reviewed, and interpreted labs, which included BMP, CBC, UA I ordered bladder irrigation with 1L NS via foley catheter I ordered imaging studies which included CT renal stone study I independently visualized and interpreted imaging which showed thickened bladder wall and possible hematoma within bladder and the monitor tracing which showed NSR Additional history was obtained from son Nada Boozer at bedside  I consulted urologist Dr Zannie Cove and discussed lab and imaging findings as noted below  After the  interventions stated above, I reevaluated the patient and found continued bloody/red-tinged urine output.  No lightheadedness or tachycardia.   Clinical Course as of Apr 05 0001  Sun May 02, 2019  1341 IMPRESSION: 1. No renal, ureteral or bladder calculi. No obvious renal or bladder mass. 2. Thick walled bladder with high attenuation material in the bladder which could suggest hematoma. A discrete bladder mass is not identified without contrast. 3. Advanced pulmonary interstitial lung disease. 4. Advanced atherosclerotic calcifications involving the  aorta and iliac arteries. 5. Colonic diverticulosis without findings for acute diverticulitis.   [MT]  [facility]   [MT]  1641 Dr Diona Fanti said patient okay to f/u in office in next 1-2 days but he'd hold her xarelto.  The patient and her son are not comfortable with this plan, and had recommended observation overnight.  I think this is reasonable given her age, and that she is still having bloody urine output.  They can recheck her hgb in the AM, discuss her xarelto issue with cardiology if necessary   [MT]    Clinical Course User Index [MT] , Carola Rhine, MD    Final Clinical Impression(s) / ED Diagnoses Final diagnoses:  Hematuria, unspecified type    Rx / DC Orders ED Discharge Orders    None       Wyvonnia Dusky, MD 05/03/19 0001

## 2019-05-02 NOTE — ED Notes (Signed)
Patient unable to void at this time. Patient did have scant bleeding and pad had blood on it.   Patient reports she has been voiding all morning just fine.   Patient instructed to call out once she has the urge to void again.

## 2019-05-02 NOTE — ED Notes (Signed)
[place], EDP at bedside.

## 2019-05-02 NOTE — ED Notes (Signed)
Per EDP patient may eat and drink.   Patient given water and crackers.

## 2019-05-02 NOTE — ED Triage Notes (Signed)
Patient brought in by son hard of hearing with complaints of bleeding from "either vaginal or bladder". Patient reports that she is not sure where the blood is coming from . On Xarelto "for over 10 years". Started 1.5 hours ago.

## 2019-05-02 NOTE — ED Notes (Signed)
Spoke to patients son and updated on plan of care.

## 2019-05-02 NOTE — ED Notes (Signed)
22 FR 3way cath placed. Irrigation in progress. Order for 1000L.   Patient tolerated well.   Total output at this time 600 mLs.

## 2019-05-02 NOTE — ED Notes (Signed)
Patient ambulated to CT

## 2019-05-03 ENCOUNTER — Encounter (HOSPITAL_COMMUNITY): Payer: Self-pay | Admitting: Internal Medicine

## 2019-05-03 DIAGNOSIS — R310 Gross hematuria: Secondary | ICD-10-CM | POA: Diagnosis not present

## 2019-05-03 DIAGNOSIS — R319 Hematuria, unspecified: Secondary | ICD-10-CM

## 2019-05-03 DIAGNOSIS — I480 Paroxysmal atrial fibrillation: Secondary | ICD-10-CM

## 2019-05-03 LAB — CBC
HCT: 40.7 % (ref 36.0–46.0)
Hemoglobin: 12.9 g/dL (ref 12.0–15.0)
MCH: 30.9 pg (ref 26.0–34.0)
MCHC: 31.7 g/dL (ref 30.0–36.0)
MCV: 97.6 fL (ref 80.0–100.0)
Platelets: 162 10*3/uL (ref 150–400)
RBC: 4.17 MIL/uL (ref 3.87–5.11)
RDW: 13.2 % (ref 11.5–15.5)
WBC: 9.3 10*3/uL (ref 4.0–10.5)
nRBC: 0 % (ref 0.0–0.2)

## 2019-05-03 LAB — BASIC METABOLIC PANEL
Anion gap: 7 (ref 5–15)
BUN: 19 mg/dL (ref 8–23)
CO2: 26 mmol/L (ref 22–32)
Calcium: 9.3 mg/dL (ref 8.9–10.3)
Chloride: 103 mmol/L (ref 98–111)
Creatinine, Ser: 0.86 mg/dL (ref 0.44–1.00)
GFR calc Af Amer: >60 mL/min (ref >60)
GFR calc non Af Amer: 59 mL/min — ABNORMAL LOW (ref >60)
Glucose, Bld: 119 mg/dL — ABNORMAL HIGH (ref 70–99)
Potassium: 3.7 mmol/L (ref 3.5–5.1)
Sodium: 136 mmol/L (ref 135–145)

## 2019-05-03 LAB — SARS CORONAVIRUS 2 (TAT 6-24 HRS): SARS Coronavirus 2: NEGATIVE

## 2019-05-03 MED ORDER — ACETAMINOPHEN 650 MG RE SUPP: 650.0000 mg | Freq: Four times a day (QID) | RECTAL | Status: DC | PRN

## 2019-05-03 MED ORDER — SODIUM CHLORIDE 0.9 % IV SOLN: INTRAVENOUS | Status: DC

## 2019-05-03 MED ORDER — BENAZEPRIL-HYDROCHLOROTHIAZIDE 10-12.5 MG PO TABS: 0.5000 | ORAL_TABLET | Freq: Every day | ORAL | Status: DC

## 2019-05-03 MED ORDER — ONDANSETRON HCL 4 MG PO TABS: 4.0000 mg | ORAL_TABLET | Freq: Four times a day (QID) | ORAL | Status: DC | PRN

## 2019-05-03 MED ORDER — ONDANSETRON HCL 4 MG/2ML IJ SOLN: 4.0000 mg | Freq: Four times a day (QID) | INTRAMUSCULAR | Status: DC | PRN

## 2019-05-03 MED ORDER — ROSUVASTATIN CALCIUM 10 MG PO TABS: 10.0000 mg | ORAL_TABLET | Freq: Every day | ORAL | Status: DC

## 2019-05-03 MED ORDER — ACETAMINOPHEN 325 MG PO TABS: 650.0000 mg | ORAL_TABLET | Freq: Four times a day (QID) | ORAL | Status: DC | PRN

## 2019-05-03 MED ORDER — HYDROCHLOROTHIAZIDE 10 MG/ML ORAL SUSPENSION
6.2500 mg | Freq: Every day | ORAL | Status: DC
  Administered 2019-05-03: 6.25 mg via ORAL
  Filled 2019-05-03: qty 1.25

## 2019-05-03 MED ORDER — LORAZEPAM 0.5 MG PO TABS: 0.5000 mg | ORAL_TABLET | Freq: Every day | ORAL | Status: DC

## 2019-05-03 MED ORDER — BENAZEPRIL HCL 10 MG PO TABS
5.0000 mg | ORAL_TABLET | Freq: Every day | ORAL | Status: DC
  Administered 2019-05-03: 5 mg via ORAL
  Filled 2019-05-03: qty 1

## 2019-05-03 MED ORDER — METOPROLOL SUCCINATE ER 25 MG PO TB24
25.0000 mg | ORAL_TABLET | Freq: Every day | ORAL | Status: DC
  Administered 2019-05-03: 25 mg via ORAL
  Filled 2019-05-03: qty 1

## 2019-05-03 NOTE — Progress Notes (Addendum)
Foley catheter discontinued. Pt tolerated well. Instructions completed prior to Foley catheter removal. Bladder irrigated with 70 cc and 140 cc instilled. Cathter removed. Pt voided one hour post catheter removal, 100 cc. Bladder scan completed 17 cc.

## 2019-05-03 NOTE — Progress Notes (Signed)
Discharge instructions reviewed with patient and son. Questions and or concerns were denied at this time. Pt stable at time of discharge, A&Ox4, ambulatory without assistance.

## 2019-05-03 NOTE — Discharge Summary (Signed)
Physician Discharge Summary  Mary Knapp [id number] DOB: [birth year]   PCP: Lavone Orn, MD  Admit date: 05/02/2019 Discharge date: 05/03/2019 Length of Stay: 0 days   Code Status: DNR  Admitted From:  Home Discharged to:   [place]:  None  Equipment/Devices:  None Discharge Condition:  Stable  Recommendations for Outpatient Follow-up   1. Follow up with urology  2. Discontinued Xarelto at discharge due to hematuria 3. Follow up BMP/CBC   Hospital Summary  This is a very pleasant 86 year old female with a history of paroxysmal afib on xarelto, hypertension, hyperlipidemia who presented to the ED after passing blood through urine x 1 day. Denies any other symptoms.   ED Course: In the ER patient had frank hematuria and had a CT renal study which shows features concerning for a clot in the bladder.  Patient is afebrile.  Abdomen appears benign.  Labs show WBC count of 12.1 glucose of 152 urine showed more than 50 RBCs.  Patient had Foley catheter placed and had at least a liter and a half normal saline bladder irrigation.  Urology was contacted and advised follow-up as outpatient but since patient had persistent bleeding and is on Xarelto was placed in observation.  Urology consulted in AM and stated it was ok to discontinue foley catheter for void trial and follow up outpatient if good output. Patient had successful void trial and was discharged in stable condition without foley and xarelto was held. Plan to follow up outpatient with urology.   A & P   Principal Problem:   Hematuria Active Problems:   Benign hypertensive heart disease without heart failure   PAF (paroxysmal atrial fibrillation) (HCC)   1. Gross hematuria in setting of Xarelto 1. No AKI 2. Discontinue Xarelto and follow up with Urology outpatient 3. No foley at discharge 4. Urology discussed with daughter who is a Urology nurse 2. Paroxysmal atrial fibrillation on beta blockers and  Xarelto 1. Continue home beta blocker 2. Hold xarelto  3. Hypertension 1. Continue home meds 4. Hyperlipidemia 1. Continue statin    Consultants  . urology  Procedures  . Foley  Antibiotics   Anti-infectives (From admission, onward)   None       Subjective  Very pleasant 86 year old female with no complaints and no overnight events. Denies any abdominal pain, dysuria, chest pain, palpitations or any other issues.   Objective   Discharge Exam: Vitals:   05/03/19 0943 05/03/19 1445  BP: 120/68 118/80  Pulse: 81 73  Resp:  18  Temp: 98.4 F (36.9 C) 98.3 F (36.8 C)  SpO2: 98% 98%   Vitals:   05/03/19 0007 05/03/19 0531 05/03/19 0943 05/03/19 1445  BP: (!) 134/96 (!) 145/95 120/68 118/80  Pulse: 98 83 81 73  Resp: 18 18  18   Temp: 98.6 F (37 C) 98.3 F (36.8 C) 98.4 F (36.9 C) 98.3 F (36.8 C)  TempSrc: Oral Oral Oral Oral  SpO2: 98% 95% 98% 98%  Weight:      Height:        Physical Exam Vitals and nursing note reviewed.  Constitutional:      Appearance: Normal appearance.  HENT:     Head: Normocephalic and atraumatic.  Eyes:     Conjunctiva/sclera: Conjunctivae normal.  Cardiovascular:     Rate and Rhythm: Normal rate and regular rhythm.  Pulmonary:     Effort: Pulmonary effort is normal.     Breath sounds: Normal  breath sounds.  Abdominal:     General: Abdomen is flat.     Palpations: Abdomen is soft.  Genitourinary:    Comments: Hematuria in foley Musculoskeletal:        General: No swelling or tenderness.  Skin:    Coloration: Skin is not jaundiced or pale.  Neurological:     Mental Status: She is alert. Mental status is at baseline.  Psychiatric:        Mood and Affect: Mood normal.        Behavior: Behavior normal.       The results of significant diagnostics from this hospitalization (including imaging, microbiology, ancillary and laboratory) are listed below for reference.     Microbiology: Recent Results (from the  past 240 hour(s))  SARS CORONAVIRUS 2 (TAT 6-24 HRS) Nasopharyngeal Nasopharyngeal Swab     Status: None   Collection Time: 05/02/19  7:10 PM   Specimen: Nasopharyngeal Swab  Result Value Ref Range Status   SARS Coronavirus 2 NEGATIVE NEGATIVE Final    Comment: (NOTE) SARS-CoV-2 target nucleic acids are NOT DETECTED. The SARS-CoV-2 RNA is generally detectable in upper and lower respiratory specimens during the acute phase of infection. Negative results do not preclude SARS-CoV-2 infection, do not rule out co-infections with other pathogens, and should not be used as the sole basis for treatment or other patient management decisions. Negative results must be combined with clinical observations, patient history, and epidemiological information. The expected result is Negative. Fact Sheet for Patients: SugarRoll.be Fact Sheet for Healthcare Providers: https://www.woods-mathews.com/ This test is not yet approved or cleared by the [place] FDA and  has been authorized for detection and/or diagnosis of SARS-CoV-2 by FDA under an Emergency Use Authorization (EUA). This EUA will remain  in effect (meaning this test can be used) for the duration of the COVID-19 declaration under Section 56 4(b)(1) of the Act, 21 U.S.C. section 360bbb-3(b)(1), unless the authorization is terminated or revoked sooner. Performed at [facility], [place] [address] [place], [place] [address]      Labs: BNP (last 3 results) No results for input(s): BNP in the last 8760 hours. Basic Metabolic Panel: Recent Labs  Lab 05/02/19 1635 05/03/19 0516  NA 137 136  K 3.8 3.7  CL 99 103  CO2 29 26  GLUCOSE 152* 119*  BUN 20 19  CREATININE 0.89 0.86  CALCIUM 9.7 9.3   Liver Function Tests: No results for input(s): AST, ALT, ALKPHOS, BILITOT, PROT, ALBUMIN in the last 168 hours. No results for input(s): LIPASE, AMYLASE in the last 168 hours. No results for  input(s): AMMONIA in the last 168 hours. CBC: Recent Labs  Lab 05/02/19 1635 05/03/19 0516  WBC 12.1* 9.3  HGB 13.7 12.9  HCT 43.5 40.7  MCV 97.1 97.6  PLT 165 162   Cardiac Enzymes: No results for input(s): CKTOTAL, CKMB, CKMBINDEX, TROPONINI in the last 168 hours. BNP: Invalid input(s): POCBNP CBG: No results for input(s): GLUCAP in the last 168 hours. D-Dimer No results for input(s): DDIMER in the last 72 hours. Hgb A1c No results for input(s): HGBA1C in the last 72 hours. Lipid Profile No results for input(s): CHOL, HDL, LDLCALC, TRIG, CHOLHDL, LDLDIRECT in the last 72 hours. Thyroid function studies No results for input(s): TSH, T4TOTAL, T3FREE, THYROIDAB in the last 72 hours.  Invalid input(s): FREET3 Anemia work up No results for input(s): VITAMINB12, FOLATE, FERRITIN, TIBC, IRON, RETICCTPCT in the last 72 hours. Urinalysis    Component Value Date/Time  COLORURINE RED (A) 05/02/2019 1430   APPEARANCEUR CLOUDY (A) 05/02/2019 1430   LABSPEC  05/02/2019 1430    TEST NOT REPORTED DUE TO COLOR INTERFERENCE OF URINE PIGMENT   PHURINE  05/02/2019 1430    TEST NOT REPORTED DUE TO COLOR INTERFERENCE OF URINE PIGMENT   GLUCOSEU NEGATIVE 05/02/2019 1430   HGBUR (A) 05/02/2019 1430    TEST NOT REPORTED DUE TO COLOR INTERFERENCE OF URINE PIGMENT   BILIRUBINUR (A) 05/02/2019 1430    TEST NOT REPORTED DUE TO COLOR INTERFERENCE OF URINE PIGMENT   KETONESUR (A) 05/02/2019 1430    TEST NOT REPORTED DUE TO COLOR INTERFERENCE OF URINE PIGMENT   PROTEINUR (A) 05/02/2019 1430    TEST NOT REPORTED DUE TO COLOR INTERFERENCE OF URINE PIGMENT   NITRITE (A) 05/02/2019 1430    TEST NOT REPORTED DUE TO COLOR INTERFERENCE OF URINE PIGMENT   LEUKOCYTESUR (A) 05/02/2019 1430    TEST NOT REPORTED DUE TO COLOR INTERFERENCE OF URINE PIGMENT   Sepsis Labs Invalid input(s): PROCALCITONIN,  WBC,  LACTICIDVEN Microbiology Recent Results (from the past 240 hour(s))  SARS CORONAVIRUS 2 (TAT  6-24 HRS) Nasopharyngeal Nasopharyngeal Swab     Status: None   Collection Time: 05/02/19  7:10 PM   Specimen: Nasopharyngeal Swab  Result Value Ref Range Status   SARS Coronavirus 2 NEGATIVE NEGATIVE Final    Comment: (NOTE) SARS-CoV-2 target nucleic acids are NOT DETECTED. The SARS-CoV-2 RNA is generally detectable in upper and lower respiratory specimens during the acute phase of infection. Negative results do not preclude SARS-CoV-2 infection, do not rule out co-infections with other pathogens, and should not be used as the sole basis for treatment or other patient management decisions. Negative results must be combined with clinical observations, patient history, and epidemiological information. The expected result is Negative. Fact Sheet for Patients: SugarRoll.be Fact Sheet for Healthcare Providers: https://www.woods-mathews.com/ This test is not yet approved or cleared by the [place] FDA and  has been authorized for detection and/or diagnosis of SARS-CoV-2 by FDA under an Emergency Use Authorization (EUA). This EUA will remain  in effect (meaning this test can be used) for the duration of the COVID-19 declaration under Section 56 4(b)(1) of the Act, 21 U.S.C. section 360bbb-3(b)(1), unless the authorization is terminated or revoked sooner. Performed at [facility], [place] [address], [place], [place] [address]     Discharge Instructions     Discharge Instructions    Diet - low sodium heart healthy   Complete by: As directed    Discharge instructions   Complete by: As directed    You were seen and examined in the hospital for blood in your urine and cared for by a hospitalist.   Upon Discharge:  - Hold Xarelto - Follow up with Urology - get lab work prior to your appointment Bring all home medications to your appointment to review Request that your primary physician go over all hospital tests and  procedures/radiological results at the follow up.   Please get all hospital records sent to your physician by signing a hospital release before you go home.   Read the complete instructions along with all the possible side effects for all the medicines you take and that have been prescribed to you. Take any new medicines after you have completely understood and accept all the possible adverse reactions/side effects.   If you have any questions about your discharge medications or the care you received while you were in the hospital,  you can call the unit and asked to speak with the hospitalist on call. Once you are discharged, your primary care physician will handle any further medical issues. Please note that NO REFILLS for any discharge medications will be authorized, as it is imperative that you return to your primary care physician (or establish a relationship with a primary care physician if you do not have one) for your aftercare needs so that they can reassess your need for medications and monitor your lab values.   Do not drive, operate heavy machinery, perform activities at heights, swimming or participation in water activities or provide baby sitting services if your were admitted for loss of consciousness/seizures or if you are on sedating medications including, but not limited to benzodiazepines, sleep medications, narcotic pain medications, etc., until you have been cleared to do so by a medical doctor.   Do not take more than prescribed medications.   Wear a seat belt while driving.  If you have smoked or chewed Tobacco in the last 2 years please stop smoking; also stop any regular Alcohol and/or any Recreational drug use including marijuana.  If you experience worsening of your admission symptoms or develop shortness of breath, chest pain, suicidal or homicidal thoughts or experience a life threatening emergency, you must seek medical attention immediately by calling 911 or calling  your PCP immediately.   Increase activity slowly   Complete by: As directed      Allergies as of 05/03/2019   No Known Allergies     Medication List    STOP taking these medications   Rivaroxaban 15 MG Tabs tablet Commonly known as: Xarelto     TAKE these medications   aspirin-sod bicarb-citric acid 325 MG Tbef tablet Commonly known as: ALKA-SELTZER Take 325 mg by mouth every 6 (six) hours as needed ('when I don't feel good').   benazepril-hydrochlorthiazide 10-12.5 MG tablet Commonly known as: LOTENSIN HCT Take 0.5 tablets by mouth daily.   Crestor 10 MG tablet Generic drug: rosuvastatin Take 10 mg by mouth daily.   LORazepam 1 MG tablet Commonly known as: ATIVAN Take 0.5 mg by mouth at bedtime.   metoprolol succinate 25 MG 24 hr tablet Commonly known as: TOPROL-XL TAKE 1 TABLET EACH DAY. What changed: See the new instructions.      Follow-up Information    [facility] Follow up.   Why: After discharge, call our office to set up follow-up later this week for possible cystoscopy.  If you have problems voiding or pass more blood, call our office and we will see you more urgently. Contact information: [place] [phone]         No Known Allergies  Time coordinating discharge: Over 30 minutes   SIGNED:   Harold Hedge, D.O. [facility] Pager: [phone]  05/03/2019, 3:02 PM

## 2019-05-03 NOTE — Consult Note (Signed)
Urology Consult   Physician requesting consult: Hal Hope  Reason for consult: Blood in urine  History of Present Illness: Mary Knapp is a 86 y.o. female without prior urologic history who presented to the emergency room yesterday with new onset of gross painless hematuria.  There was slight dysuria.  She was found to have a stable hemoglobin.  She did have a few clots in her bladder which were irrigated appropriately through a 83 [place] three-way Foley catheter by the nursing staff in the emergency room.  I was originally consulted by Dr. Alvino Chapel and felt that the patient could go home.  However, the family requested that the patient be admitted for observation.  She has had no prior episodes of gross hematuria.  She is a non-smoker.  She is on Xarelto, long-term, for paroxysmal atrial fibrillation.  Additionally, she states that she has obstructive carotid disease which is also a reason why she is on this.  She has had a stable night.  She has had no significant need for irrigation of her Foley catheter.  Catheter is still draining reddish urine without difficulty.  She does feel well and wants to go home.   Past Medical History:  Diagnosis Date  . Carotid artery disease (HCC)    nonobstructive  . Chronic anticoagulation   . H/O atrial flutter   . Hyperlipidemia   . Hypertension   . PAF (paroxysmal atrial fibrillation) ([place])   . Valvular heart disease     Past Surgical History:  Procedure Laterality Date  . childbirth     x4     Current Hospital Medications: Scheduled Meds: . benazepril  5 mg Oral Daily   And  . hydrochlorothiazide  6.25 mg Oral Daily  . [START ON 05/04/2019] LORazepam  0.5 mg Oral QHS  . metoprolol succinate  25 mg Oral Daily  . rosuvastatin  10 mg Oral q1800   Continuous Infusions: . sodium chloride 10 mL/hr at 05/03/19 0200   PRN Meds:.acetaminophen **OR** acetaminophen, ondansetron **OR** ondansetron (ZOFRAN) IV  Allergies: No Known  Allergies  Family History  Problem Relation Age of Onset  . Heart disease Father     Social History:  reports that she has quit smoking. Her smoking use included cigarettes. She has never used smokeless tobacco. She reports that she does not drink alcohol or use drugs.  ROS: A complete review of systems was performed.  All systems are negative except for pertinent findings as noted.  Physical Exam:  Vital signs in last 24 hours: Temp:  [97.6 F (36.4 C)-98.6 F (37 C)] 98.4 F (36.9 C) (04/05 0943) Pulse Rate:  [76-98] 81 (04/05 0943) Resp:  [16-18] 18 (04/05 0531) BP: (110-146)/(64-96) 120/68 (04/05 0943) SpO2:  [90 %-99 %] 98 % (04/05 0943) Weight:  [59.7 kg] 59.7 kg (04/04 2058) General:  Alert and oriented, No acute distress HEENT: Normocephalic, atraumatic Neck: No JVD or lymphadenopathy Cardiovascular: Regular rate  Lungs: Normal inspiratory/expiratory excursion Abdomen: Soft, nontender, nondistended, no abdominal masses Back: No CVA tenderness Extremities: No edema Neurologic: Grossly intact  Laboratory Data:  Recent Labs    05/02/19 1635 05/03/19 0516  WBC 12.1* 9.3  HGB 13.7 12.9  HCT 43.5 40.7  PLT 165 162    Recent Labs    05/02/19 1635 05/03/19 0516  NA 137 136  K 3.8 3.7  CL 99 103  GLUCOSE 152* 119*  BUN 20 19  CALCIUM 9.7 9.3  CREATININE 0.89 0.86     Results for orders  placed or performed during the hospital encounter of 05/02/19 (from the past 24 hour(s))  Urinalysis, Routine w reflex microscopic     Status: Abnormal   Collection Time: 05/02/19  2:30 PM  Result Value Ref Range   Color, Urine RED (A) YELLOW   APPearance CLOUDY (A) CLEAR   Specific Gravity, Urine  1.005 - 1.030    TEST NOT REPORTED DUE TO COLOR INTERFERENCE OF URINE PIGMENT   pH  5.0 - 8.0    TEST NOT REPORTED DUE TO COLOR INTERFERENCE OF URINE PIGMENT   Glucose, UA NEGATIVE NEGATIVE mg/dL   Hgb urine dipstick (A) NEGATIVE    TEST NOT REPORTED DUE TO COLOR  INTERFERENCE OF URINE PIGMENT   Bilirubin Urine (A) NEGATIVE    TEST NOT REPORTED DUE TO COLOR INTERFERENCE OF URINE PIGMENT   Ketones, ur (A) NEGATIVE mg/dL    TEST NOT REPORTED DUE TO COLOR INTERFERENCE OF URINE PIGMENT   Protein, ur (A) NEGATIVE mg/dL    TEST NOT REPORTED DUE TO COLOR INTERFERENCE OF URINE PIGMENT   Nitrite (A) NEGATIVE    TEST NOT REPORTED DUE TO COLOR INTERFERENCE OF URINE PIGMENT   Leukocytes,Ua (A) NEGATIVE    TEST NOT REPORTED DUE TO COLOR INTERFERENCE OF URINE PIGMENT  Urinalysis, Microscopic (reflex)     Status: Abnormal   Collection Time: 05/02/19  2:30 PM  Result Value Ref Range   RBC / HPF >50 0 - 5 RBC/hpf   WBC, UA NONE SEEN 0 - 5 WBC/hpf   Bacteria, UA RARE (A) NONE SEEN   Squamous Epithelial / LPF 0-5 0 - 5   Mucus PRESENT   Basic metabolic panel     Status: Abnormal   Collection Time: 05/02/19  4:35 PM  Result Value Ref Range   Sodium 137 135 - 145 mmol/L   Potassium 3.8 3.5 - 5.1 mmol/L   Chloride 99 98 - 111 mmol/L   CO2 29 22 - 32 mmol/L   Glucose, Bld 152 (H) 70 - 99 mg/dL   BUN 20 8 - 23 mg/dL   Creatinine, Ser 0.89 0.44 - 1.00 mg/dL   Calcium 9.7 8.9 - 10.3 mg/dL   GFR calc non Af Amer 57 (L) >60 mL/min   GFR calc Af Amer >60 >60 mL/min   Anion gap 9 5 - 15  CBC     Status: Abnormal   Collection Time: 05/02/19  4:35 PM  Result Value Ref Range   WBC 12.1 (H) 4.0 - 10.5 K/uL   RBC 4.48 3.87 - 5.11 MIL/uL   Hemoglobin 13.7 12.0 - 15.0 g/dL   HCT 43.5 36.0 - 46.0 %   MCV 97.1 80.0 - 100.0 fL   MCH 30.6 26.0 - 34.0 pg   MCHC 31.5 30.0 - 36.0 g/dL   RDW 13.2 11.5 - 15.5 %   Platelets 165 150 - 400 K/uL   nRBC 0.0 0.0 - 0.2 %  SARS CORONAVIRUS 2 (TAT 6-24 HRS) Nasopharyngeal Nasopharyngeal Swab     Status: None   Collection Time: 05/02/19  7:10 PM   Specimen: Nasopharyngeal Swab  Result Value Ref Range   SARS Coronavirus 2 NEGATIVE NEGATIVE  Basic metabolic panel     Status: Abnormal   Collection Time: 05/03/19  5:16 AM  Result  Value Ref Range   Sodium 136 135 - 145 mmol/L   Potassium 3.7 3.5 - 5.1 mmol/L   Chloride 103 98 - 111 mmol/L   CO2 26 22 - 32 mmol/L  Glucose, Bld 119 (H) 70 - 99 mg/dL   BUN 19 8 - 23 mg/dL   Creatinine, Ser 0.86 0.44 - 1.00 mg/dL   Calcium 9.3 8.9 - 10.3 mg/dL   GFR calc non Af Amer 59 (L) >60 mL/min   GFR calc Af Amer >60 >60 mL/min   Anion gap 7 5 - 15  CBC     Status: None   Collection Time: 05/03/19  5:16 AM  Result Value Ref Range   WBC 9.3 4.0 - 10.5 K/uL   RBC 4.17 3.87 - 5.11 MIL/uL   Hemoglobin 12.9 12.0 - 15.0 g/dL   HCT 40.7 36.0 - 46.0 %   MCV 97.6 80.0 - 100.0 fL   MCH 30.9 26.0 - 34.0 pg   MCHC 31.7 30.0 - 36.0 g/dL   RDW 13.2 11.5 - 15.5 %   Platelets 162 150 - 400 K/uL   nRBC 0.0 0.0 - 0.2 %   Recent Results (from the past 240 hour(s))  SARS CORONAVIRUS 2 (TAT 6-24 HRS) Nasopharyngeal Nasopharyngeal Swab     Status: None   Collection Time: 05/02/19  7:10 PM   Specimen: Nasopharyngeal Swab  Result Value Ref Range Status   SARS Coronavirus 2 NEGATIVE NEGATIVE Final    Comment: (NOTE) SARS-CoV-2 target nucleic acids are NOT DETECTED. The SARS-CoV-2 RNA is generally detectable in upper and lower respiratory specimens during the acute phase of infection. Negative results do not preclude SARS-CoV-2 infection, do not rule out co-infections with other pathogens, and should not be used as the sole basis for treatment or other patient management decisions. Negative results must be combined with clinical observations, patient history, and epidemiological information. The expected result is Negative. Fact Sheet for Patients: SugarRoll.be Fact Sheet for Healthcare Providers: https://www.woods-mathews.com/ This test is not yet approved or cleared by the [place] FDA and  has been authorized for detection and/or diagnosis of SARS-CoV-2 by FDA under an Emergency Use Authorization (EUA). This EUA will remain  in effect  (meaning this test can be used) for the duration of the COVID-19 declaration under Section 56 4(b)(1) of the Act, 21 U.S.C. section 360bbb-3(b)(1), unless the authorization is terminated or revoked sooner. Performed at [facility], [address], [place], [place] [address]     Renal Function: Recent Labs    05/02/19 1635 05/03/19 0516  CREATININE 0.89 0.86   Estimated Creatinine Clearance: 36.6 mL/min (by C-G formula based on SCr of 0.86 mg/dL).  Radiologic Imaging: CT Renal Stone Study  Result Date: 05/02/2019 CLINICAL DATA:  Gross hematuria. EXAM: CT ABDOMEN AND PELVIS WITHOUT CONTRAST TECHNIQUE: Multidetector CT imaging of the abdomen and pelvis was performed following the standard protocol without IV contrast. COMPARISON:  None. FINDINGS: Lower chest: Advanced changes of interstitial lung disease. No infiltrates or effusions. Calcified granuloma noted at the right lung base. Aortic and coronary artery calcifications are noted. No pericardial effusion. Hepatobiliary: No focal hepatic lesions are identified without contrast. The gallbladder is grossly normal. No common bile duct dilatation. Pancreas: No mass, inflammation or ductal dilatation. Spleen: Normal size. No focal lesions. Adrenals/Urinary Tract: Adrenal glands and kidneys are unremarkable. No renal calculi, obvious renal mass or hydronephrosis. No obstructing ureteral calculi. Thick walled bladder possibly asymmetrically thick on the right side. A discrete bladder mass is not identified. There is high attenuation material in the bladder which could suggest hematoma. Stomach/Bowel: The stomach, duodenum, small bowel and colon are grossly normal without oral contrast. No acute inflammatory process, mass lesion  or obstructive findings. The terminal ileum and appendix appear normal. Colonic diverticulosis without findings for acute diverticulitis. Vascular/Lymphatic: Advanced atherosclerotic calcifications involving the aorta and  iliac arteries. No focal aneurysm. No mesenteric or retroperitoneal mass or adenopathy. Reproductive: The uterus and ovaries are unremarkable. Other: No pelvic mass or adenopathy. No free pelvic fluid collections. No inguinal mass or adenopathy. No abdominal wall hernia or subcutaneous lesions. Musculoskeletal: No significant bony findings. Age related degenerative changes involving the spine and hips IMPRESSION: 1. No renal, ureteral or bladder calculi. No obvious renal or bladder mass. 2. Thick walled bladder with high attenuation material in the bladder which could suggest hematoma. A discrete bladder mass is not identified without contrast. 3. Advanced pulmonary interstitial lung disease. 4. Advanced atherosclerotic calcifications involving the aorta and iliac arteries. 5. Colonic diverticulosis without findings for acute diverticulitis. Aortic Atherosclerosis (ICD10-I70.0). Electronically Signed   By: Marijo Sanes M.D.   On: 05/02/2019 13:37    I independently reviewed the above imaging studies.  Impression/Assessment:  Gross hematuria.  Etiology unknown, but probably exacerbated by her Xarelto.  Currently, urine is clearing but still bloody.  She is stable, and desires to go home.  Plan:  I have spoken with the patient as well as her daughter who is in [place].  Her daughter is a urology nurse.  I do feel like the patient can go home, the patient and daughter do not want to go home with a catheter.  I feel it is okay to remove the catheter after irrigating this, and if she does void adequately, although she will still have blood in her urine, I think it is okay to go home followed by outpatient evaluation later on this week in our office.  Etiologies of gross hematuria were discussed with the patient and her daughter.  Most likely this is benign in nature but she will need outpatient cystoscopy.  If she does have difficulty urinating following discharge she can come to our office.

## 2019-05-03 NOTE — H&P (Signed)
History and Physical    Mary Knapp [id number] DOB: 23-Aug-1926 DOA: 05/02/2019  PCP: Lavone Orn, MD  Patient coming from: Home.  Chief Complaint: Passing blood in the urine.  HPI: Mary Knapp is a 87 y.o. female with history of paroxysmal atrial fibrillation, hypertension hyperlipidemia presents to the ER after having passing blood since yesterday morning.  Denies any fever chills nausea vomiting or diarrhea.  No abdominal pain.  Patient take Xarelto for A. fib.  ED Course: In the ER patient had frank hematuria and had a CT renal study which shows features concerning for a clot in the bladder.  Patient is afebrile.  Abdomen appears benign.  Labs show WBC count of 12.1 glucose of 152 urine showed more than 50 RBCs.  Patient had Foley catheter placed and had at least a liter and a half normal saline bladder irrigation.  Dr. Novella Olive neurologist was contacted and advised follow-up as outpatient but since patient had persistent bleeding and is on Xarelto was admitted for observation.  Review of Systems: As per HPI, rest all negative.   Past Medical History:  Diagnosis Date  . Carotid artery disease (HCC)    nonobstructive  . Chronic anticoagulation   . H/O atrial flutter   . Hyperlipidemia   . Hypertension   . PAF (paroxysmal atrial fibrillation) ([place])   . Valvular heart disease     Past Surgical History:  Procedure Laterality Date  . childbirth     x4     reports that she has quit smoking. Her smoking use included cigarettes. She has never used smokeless tobacco. She reports that she does not drink alcohol or use drugs.  No Known Allergies  Family History  Problem Relation Age of Onset  . Heart disease Father     Prior to Admission medications   Medication Sig Start Date End Date Taking? Authorizing Provider  aspirin-sod bicarb-citric acid (ALKA-SELTZER) 325 MG TBEF tablet Take 325 mg by mouth every 6 (six) hours as needed ('when I don't feel good').   Yes  [provider]  benazepril-hydrochlorthiazide (LOTENSIN HCT) 10-12.5 MG per tablet Take 0.5 tablets by mouth daily.  03/24/11  Yes [provider]  CRESTOR 10 MG tablet Take 10 mg by mouth daily.  03/24/11  Yes [provider]  LORazepam (ATIVAN) 1 MG tablet Take 0.5 mg by mouth at bedtime.  06/30/13  Yes [provider]  metoprolol succinate (TOPROL-XL) 25 MG 24 hr tablet TAKE 1 TABLET EACH DAY. Patient taking differently: Take 25 mg by mouth daily.  04/19/19  Yes Skeet Latch, MD  Rivaroxaban (XARELTO) 15 MG TABS tablet Take 1 tablet (15 mg total) by mouth daily. 02/04/19  Yes Skeet Latch, MD    Physical Exam: Constitutional: Moderately built and nourished. Vitals:   05/02/19 1700 05/02/19 1948 05/02/19 2053 05/02/19 2058  BP: 126/72 110/64 (!) 143/89   Pulse: 88 76 80   Resp: 17 16 18    Temp:  98.1 F (36.7 C) 98.6 F (37 C)   TempSrc:   Oral   SpO2: 97% 90% 97%   Weight:    59.7 kg  Height:    5' 1.5" (1.562 m)   Eyes: Anicteric no pallor. ENMT: No discharge from the ears eyes nose or mouth. Neck: No mass felt.  No neck rigidity. Respiratory: No rhonchi or crepitations. Cardiovascular: S1-S2 heard. Abdomen: Soft nontender bowel sound present. Musculoskeletal: No edema. Skin: No rash. Neurologic: Alert awake oriented to time place and person.  Moves all extremities. Psychiatric: Appears normal with normal affect.   Labs on Admission: I have personally reviewed following labs and imaging studies  CBC: Recent Labs  Lab 05/02/19 1635  WBC 12.1*  HGB 13.7  HCT 43.5  MCV 97.1  PLT [id number]   Basic Metabolic Panel: Recent Labs  Lab 05/02/19 1635  NA 137  K 3.8  CL 99  CO2 29  GLUCOSE 152*  BUN 20  CREATININE 0.89  CALCIUM 9.7   GFR: Estimated Creatinine Clearance: 35.4 mL/min (by C-G formula based on SCr of 0.89 mg/dL). Liver Function Tests: No results for input(s): AST, ALT, ALKPHOS, BILITOT, PROT, ALBUMIN in the last  168 hours. No results for input(s): LIPASE, AMYLASE in the last 168 hours. No results for input(s): AMMONIA in the last 168 hours. Coagulation Profile: No results for input(s): INR, PROTIME in the last 168 hours. Cardiac Enzymes: No results for input(s): CKTOTAL, CKMB, CKMBINDEX, TROPONINI in the last 168 hours. BNP (last 3 results) No results for input(s): PROBNP in the last 8760 hours. HbA1C: No results for input(s): HGBA1C in the last 72 hours. CBG: No results for input(s): GLUCAP in the last 168 hours. Lipid Profile: No results for input(s): CHOL, HDL, LDLCALC, TRIG, CHOLHDL, LDLDIRECT in the last 72 hours. Thyroid Function Tests: No results for input(s): TSH, T4TOTAL, FREET4, T3FREE, THYROIDAB in the last 72 hours. Anemia Panel: No results for input(s): VITAMINB12, FOLATE, FERRITIN, TIBC, IRON, RETICCTPCT in the last 72 hours. Urine analysis:    Component Value Date/Time   COLORURINE RED (A) 05/02/2019 1430   APPEARANCEUR CLOUDY (A) 05/02/2019 1430   LABSPEC  05/02/2019 1430    TEST NOT REPORTED DUE TO COLOR INTERFERENCE OF URINE PIGMENT   PHURINE  05/02/2019 1430    TEST NOT REPORTED DUE TO COLOR INTERFERENCE OF URINE PIGMENT   GLUCOSEU NEGATIVE 05/02/2019 1430   HGBUR (A) 05/02/2019 1430    TEST NOT REPORTED DUE TO COLOR INTERFERENCE OF URINE PIGMENT   BILIRUBINUR (A) 05/02/2019 1430    TEST NOT REPORTED DUE TO COLOR INTERFERENCE OF URINE PIGMENT   KETONESUR (A) 05/02/2019 1430    TEST NOT REPORTED DUE TO COLOR INTERFERENCE OF URINE PIGMENT   PROTEINUR (A) 05/02/2019 1430    TEST NOT REPORTED DUE TO COLOR INTERFERENCE OF URINE PIGMENT   NITRITE (A) 05/02/2019 1430    TEST NOT REPORTED DUE TO COLOR INTERFERENCE OF URINE PIGMENT   LEUKOCYTESUR (A) 05/02/2019 1430    TEST NOT REPORTED DUE TO COLOR INTERFERENCE OF URINE PIGMENT   Sepsis Labs: @LABRCNTIP (procalcitonin:4,lacticidven:4) )No results found for this or any previous visit (from the past 240 hour(s)).    Radiological Exams on Admission: CT Renal Stone Study  Result Date: 05/02/2019 CLINICAL DATA:  Gross hematuria. EXAM: CT ABDOMEN AND PELVIS WITHOUT CONTRAST TECHNIQUE: Multidetector CT imaging of the abdomen and pelvis was performed following the standard protocol without IV contrast. COMPARISON:  None. FINDINGS: Lower chest: Advanced changes of interstitial lung disease. No infiltrates or effusions. Calcified granuloma noted at the right lung base. Aortic and coronary artery calcifications are noted. No pericardial effusion. Hepatobiliary: No focal hepatic lesions are identified without contrast. The gallbladder is grossly normal. No common bile duct dilatation. Pancreas: No mass, inflammation or ductal dilatation. Spleen: Normal size. No focal lesions. Adrenals/Urinary Tract: Adrenal glands and kidneys are unremarkable. No renal calculi, obvious renal mass or hydronephrosis. No obstructing ureteral calculi. Thick walled bladder possibly asymmetrically thick on the right side. A discrete bladder mass is not identified.  There is high attenuation material in the bladder which could suggest hematoma. Stomach/Bowel: The stomach, duodenum, small bowel and colon are grossly normal without oral contrast. No acute inflammatory process, mass lesion or obstructive findings. The terminal ileum and appendix appear normal. Colonic diverticulosis without findings for acute diverticulitis. Vascular/Lymphatic: Advanced atherosclerotic calcifications involving the aorta and iliac arteries. No focal aneurysm. No mesenteric or retroperitoneal mass or adenopathy. Reproductive: The uterus and ovaries are unremarkable. Other: No pelvic mass or adenopathy. No free pelvic fluid collections. No inguinal mass or adenopathy. No abdominal wall hernia or subcutaneous lesions. Musculoskeletal: No significant bony findings. Age related degenerative changes involving the spine and hips IMPRESSION: 1. No renal, ureteral or bladder calculi.  No obvious renal or bladder mass. 2. Thick walled bladder with high attenuation material in the bladder which could suggest hematoma. A discrete bladder mass is not identified without contrast. 3. Advanced pulmonary interstitial lung disease. 4. Advanced atherosclerotic calcifications involving the aorta and iliac arteries. 5. Colonic diverticulosis without findings for acute diverticulitis. Aortic Atherosclerosis (ICD10-I70.0). Electronically Signed   By: Marijo Sanes M.D.   On: 05/02/2019 13:37     Assessment/Plan Principal Problem:   Hematuria Active Problems:   Benign hypertensive heart disease without heart failure   PAF (paroxysmal atrial fibrillation) ([place])    1. Hematuria in the setting of Xarelto -presently on Foley catheter patient urine has started clearing up at this time at the time of my exam.  Will closely monitor holding off Xarelto after explaining the risk to the patient.  Follow CBC.  Will need urology follow-up. 2. Paroxysmal atrial fibrillation on beta-blocker for rate control.  Xarelto on hold due to hematuria. 3. Hypertension on beta-blocker ACE inhibitor and hydrochlorothiazide. 4. Hyperlipidemia on statins.   DVT prophylaxis: SCDs for now since patient has hematuria and will avoid anticoagulation. Code Status: DNR as confirmed with patient. Family Communication: Discussed with patient. Disposition Plan: Home. Consults called: ER physician discussed with neurologist. Admission status: Observation.   Rise Patience MD [facility] Pager [phone].  If 7PM-7AM, please contact night-coverage [email or web address] Password [email or web address]  05/03/2019, 12:05 AM

## 2019-05-04 LAB — URINE CULTURE: Culture: <10000 — AB

## 2019-05-06 ENCOUNTER — Telehealth: Payer: Self-pay | Admitting: Cardiovascular Disease

## 2019-05-06 NOTE — Telephone Encounter (Signed)
Pt called to report that she was in the hospital 05/02/19 with hematuria and was told she had a blood clot in her bladder and the Urologist had stopped her Xarelto.   She is not seeing Dr. Diona Fanti until 06/14/19.. I strongly urged her to call him to find out how long he needs her to be off of the Xarelto because we have her on it for her Afib.   Pt will call his office now and will let me know so I can message Dr. Oval Linsey with the information for review.

## 2019-05-06 NOTE — Telephone Encounter (Signed)
New Message   Patient is calling to see when she was diagnosis with afib as well as when she was advised of having blockage in her carotid. She also wants to know why she is taking Xarelto. Please call to advised.

## 2019-05-28 ENCOUNTER — Other Ambulatory Visit: Payer: Self-pay | Admitting: Cardiovascular Disease

## 2019-05-28 DIAGNOSIS — I6523 Occlusion and stenosis of bilateral carotid arteries: Secondary | ICD-10-CM

## 2019-07-19 ENCOUNTER — Other Ambulatory Visit: Payer: Self-pay | Admitting: Cardiovascular Disease

## 2019-10-05 ENCOUNTER — Other Ambulatory Visit: Payer: Self-pay | Admitting: Cardiovascular Disease

## 2019-12-29 ENCOUNTER — Other Ambulatory Visit: Payer: Self-pay | Admitting: Cardiovascular Disease

## 2019-12-29 NOTE — Telephone Encounter (Signed)
Prescription refill request for Xarelto received.  Indication: Atrial Flutter Last office visit: 03/2019  Oval Linsey Weight: 59.7 kg Age: 86 Scr: 0.86 04/2019 CrCl: 40.16 ml/min  Prescription refilled

## 2020-03-08 DIAGNOSIS — M810 Age-related osteoporosis without current pathological fracture: Secondary | ICD-10-CM | POA: Diagnosis not present

## 2020-03-15 DIAGNOSIS — H35721 Serous detachment of retinal pigment epithelium, right eye: Secondary | ICD-10-CM | POA: Diagnosis not present

## 2020-03-15 DIAGNOSIS — H33191 Other retinoschisis and retinal cysts, right eye: Secondary | ICD-10-CM | POA: Diagnosis not present

## 2020-03-15 DIAGNOSIS — H35373 Puckering of macula, bilateral: Secondary | ICD-10-CM | POA: Diagnosis not present

## 2020-03-15 DIAGNOSIS — Z961 Presence of intraocular lens: Secondary | ICD-10-CM | POA: Diagnosis not present

## 2020-03-15 DIAGNOSIS — H35363 Drusen (degenerative) of macula, bilateral: Secondary | ICD-10-CM | POA: Diagnosis not present

## 2020-03-15 DIAGNOSIS — H35453 Secondary pigmentary degeneration, bilateral: Secondary | ICD-10-CM | POA: Diagnosis not present

## 2020-03-15 DIAGNOSIS — H353132 Nonexudative age-related macular degeneration, bilateral, intermediate dry stage: Secondary | ICD-10-CM | POA: Diagnosis not present

## 2020-03-27 DIAGNOSIS — E7800 Pure hypercholesterolemia, unspecified: Secondary | ICD-10-CM | POA: Diagnosis not present

## 2020-03-27 DIAGNOSIS — N1831 Chronic kidney disease, stage 3a: Secondary | ICD-10-CM | POA: Diagnosis not present

## 2020-03-27 DIAGNOSIS — I480 Paroxysmal atrial fibrillation: Secondary | ICD-10-CM | POA: Diagnosis not present

## 2020-03-27 DIAGNOSIS — M810 Age-related osteoporosis without current pathological fracture: Secondary | ICD-10-CM | POA: Diagnosis not present

## 2020-03-27 DIAGNOSIS — I10 Essential (primary) hypertension: Secondary | ICD-10-CM | POA: Diagnosis not present

## 2020-03-27 DIAGNOSIS — K219 Gastro-esophageal reflux disease without esophagitis: Secondary | ICD-10-CM | POA: Diagnosis not present

## 2020-03-27 DIAGNOSIS — G4700 Insomnia, unspecified: Secondary | ICD-10-CM | POA: Diagnosis not present

## 2020-03-30 ENCOUNTER — Other Ambulatory Visit: Payer: Self-pay

## 2020-03-30 ENCOUNTER — Ambulatory Visit: Payer: Medicare Other | Admitting: Cardiovascular Disease

## 2020-03-30 VITALS — BP 124/76 | HR 90 | Ht 61.5 in | Wt 129.8 lb

## 2020-03-30 DIAGNOSIS — I6523 Occlusion and stenosis of bilateral carotid arteries: Secondary | ICD-10-CM

## 2020-03-30 DIAGNOSIS — I483 Typical atrial flutter: Secondary | ICD-10-CM | POA: Diagnosis not present

## 2020-03-30 DIAGNOSIS — E7800 Pure hypercholesterolemia, unspecified: Secondary | ICD-10-CM

## 2020-03-30 DIAGNOSIS — I119 Hypertensive heart disease without heart failure: Secondary | ICD-10-CM | POA: Diagnosis not present

## 2020-03-30 DIAGNOSIS — I10 Essential (primary) hypertension: Secondary | ICD-10-CM

## 2020-03-30 NOTE — Progress Notes (Signed)
Cardiology Office Note   Date:  04/21/2020   ID:  Mary Knapp, DOB 1926-06-02, MRN [id number]  PCP:  Mary Orn, MD  Cardiologist:   Mary Latch, MD   No chief complaint on file.   History of Present Illness: Mary Knapp is a 87 y.o. female with paroxysmal atrial fibrillation, hypertension, hyperlipidemia, carotid artery disease, who presents for follow-up.  Mary Knapp had a normal Myoview in 2007 with no evidence of ischemia. Her LVEF was 59%. She had an echo 12/01/12 that revealed LVEF 55-60% with moderate aortic regurgitation, moderate mitral regurgitation, and severe biatrial enlargement. There was also moderate to severe tricuspid regurgitation. She wore a 30 day event monitor that revealed paroxysmal atrial fibrillation.  She is asymptomatic.  She has complained of fatigue but afterr discussion of possibilities to restore sinus rhythm, she elected to continue with current rate control therapy.  Rosuvastatin was reduced due to cramping.  This does seem to have helped.  Mary Knapp had a home screening and her right ABI was 0.42.  The left was 0.84.  She denies any pain or cramping in her legs.  She previously had ABIs in 2017 that revealed heterogeneous plaque but no stenosis and normal ABIs.  Therefore, given her lack of symptoms and age, no further testing was pursued.  She had repeat carotid Dopplers 03/2019 that revealed mild blockage on the L and moderate on the R unchanged from piror.  She continues to walk up to a mile.  She feels well but is afraid of falling.  She hasn't fallen.  She has been feeling generally well.  She is excited that she is able to drive her red convertible now that the weather is better.     Past Medical History:  Diagnosis Date  . Carotid artery disease (HCC)    nonobstructive  . Chronic anticoagulation   . H/O atrial flutter   . Hyperlipidemia   . Hypertension   . PAF (paroxysmal atrial fibrillation) ([place])   . Valvular heart disease      Past Surgical History:  Procedure Laterality Date  . childbirth     x4     Current Outpatient Medications  Medication Sig Dispense Refill  . aspirin-sod bicarb-citric acid (ALKA-SELTZER) 325 MG TBEF tablet Take 325 mg by mouth every 6 (six) hours as needed ('when I don't feel good').    . B Complex Vitamins (VITAMIN B COMPLEX) TABS See admin instructions.    . benazepril-hydrochlorthiazide (LOTENSIN HCT) 10-12.5 MG per tablet Take 0.5 tablets by mouth daily.     . CRESTOR 10 MG tablet Take 10 mg by mouth daily.     . metoprolol succinate (TOPROL-XL) 25 MG 24 hr tablet TAKE 1 TABLET EACH DAY. 90 tablet 3  . Multiple Vitamins-Minerals (EYE VITAMINS) CAPS 2 tablets    . XARELTO 15 MG TABS tablet TAKE 1 TABLET ONCE DAILY. 90 tablet 1   No current facility-administered medications for this visit.    Allergies:   Patient has no known allergies.    Social History:  The patient  reports that she has quit smoking. Her smoking use included cigarettes. She has never used smokeless tobacco. She reports that she does not drink alcohol and does not use drugs.   Family History:  The patient's family history includes Heart disease in her father.    ROS:  Please see the history of present illness.   Otherwise, review of systems are positive for none.   All other  systems are reviewed and negative.    PHYSICAL EXAM: VS:  BP 124/76   Pulse 90   Ht 5' 1.5" (1.562 m)   Wt 129 lb 12.8 oz (58.9 kg)   SpO2 99%   BMI 24.13 kg/m  , BMI Body mass index is 24.13 kg/m. GENERAL:  Well appearing HEENT: Pupils equal round and reactive, fundi not visualized, oral mucosa unremarkable NECK:  No jugular venous distention, waveform within normal limits, carotid upstroke brisk and symmetric, no bruits LUNGS:  Clear to auscultation bilaterally HEART:  Irregularly irregular.  PMI not displaced or sustained,S1 and S2 within normal limits, no S3, no S4, no clicks, no rubs, no murmurs ABD:  Flat, positive  bowel sounds normal in frequency in pitch, no bruits, no rebound, no guarding, no midline pulsatile mass, no hepatomegaly, no splenomegaly EXT:  2 plus pulses throughout, no edema, no cyanosis no clubbing SKIN:  No rashes no nodules NEURO:  Cranial nerves II through XII grossly intact, motor grossly intact throughout PSYCH:  Cognitively intact, oriented to person place and time   EKG:  EKG is ordered today. The ekg ordered today demonstrates atrial fibrillation. Rate 85 bpm. PVC. Inferior T wave abnormalities. 08/13/16: Atrial fibrillation. Rate 81 bpm. Nonspecific T wave abnormalities. PVCs. 03/27/18: Atrial fibrillation.  Rate 75 bpm.  Nonspecific T wave abnormalities.  03/29/19: Atrial fibrillation.  Rate 81 bpm.  Nonspecific T wave abnormalies.   03/30/20: Atrial fibrillation.  Rate 90 bpm.  PVC.  Inferior TWI.  Carotid Doppler 12/27/14: 60-79% right internal carotid artery stenosis. 1-39% left internal carotid stenosis.  Echo 12/01/12: Study Conclusions  - Left ventricle: The cavity size was normal. Wall thickness was normal. Systolic function was normal. The estimated ejection fraction was in the range of 55% to 60%. - Aortic valve: Mild regurgitation. - Mitral valve: Moderate regurgitation. - Left atrium: The atrium was moderately to severely dilated. - Right atrium: The atrium was moderately to severely dilated. - Atrial septum: No defect or patent foramen ovale was identified. - Tricuspid valve: Moderate-severe regurgitation. - Pulmonary arteries: PA peak pressure: 6mm Hg (S).  Recent Labs: 05/03/2019: BUN 19; Creatinine, Ser 0.86; Hemoglobin 12.9; Platelets 162; Potassium 3.7; Sodium 136    Lipid Panel    Component Value Date/Time   CHOL 151 04/08/2019 1426   TRIG 90 04/08/2019 1426   HDL 60 04/08/2019 1426   CHOLHDL 2.5 04/08/2019 1426   LDLCALC 74 04/08/2019 1426      Wt Readings from Last 3 Encounters:  03/30/20 129 lb 12.8 oz (58.9 kg)  05/02/19 131  lb 9.8 oz (59.7 kg)  03/29/19 134 lb (60.8 kg)      ASSESSMENT AND PLAN:  # Persistent atrial fibrillation: Mary Knapp remains in atrial fibrillation.  She continues to do very well.  Rates are well-controlled and she is completely asymptomatic.  Continue metoprolol and anticoagulation with Xarelto.  Xarelto dose reduced due to age and weight.  This patients CHA2DS2-VASc Score and unadjusted Ischemic Stroke Rate (% per year) is equal to 4.8 % stroke rate/year from a score of 4  Above score calculated as 1 point each if present [CHF, HTN, DM, Vascular=MI/PAD/Aortic Plaque, Age if 65-74, or Female] Above score calculated as 2 points each if present [Age > 75, or Stroke/TIA/TE]  # Hypertension:  Blood pressure remains well-controlled. Continue benazepril, hydrochlorothiazide, and metoprolol.  Renal function stable on current regimen.  # Carotid stenosis: # Hyperlipidemia:  She has mild ICA stenosis on the L and moderate on  the R 02/2017.  Stable in 2021.  LDL at goal.  Continue aspirin and rosuvastatin.    Current medicines are reviewed at length with the patient today.  The patient does not have concerns regarding medicines.  The following changes have been made:  no change  Labs/ tests ordered today include:   Orders Placed This Encounter  Procedures  . EKG 12-Lead     Disposition:   FU with  C. Oval Linsey, MD, [facility] in 1 year   Time spent: 43 minutes-Greater than 50% of this time was spent in counseling, explanation of diagnosis, planning of further management, and coordination of care.   Signed,  C. Oval Linsey, MD, [facility]  04/21/2020 11:16 AM    [place]

## 2020-03-30 NOTE — Patient Instructions (Signed)
Medication Instructions:  Your physician recommends that you continue on your current medications as directed. Please refer to the Current Medication list given to you today.   *If you need a refill on your cardiac medications before your next appointment, please call your pharmacy*  Lab Work: NONE   Testing/Procedures: NONE   Follow-Up: At [facility], you and your health needs are our priority.  As part of our continuing mission to provide you with exceptional heart care, we have created designated Provider Care Teams.  These Care Teams include your primary Cardiologist (physician) and Advanced Practice Providers (APPs -  Physician Assistants and Nurse Practitioners) who all work together to provide you with the care you need, when you need it.  We recommend signing up for the patient portal called "MyChart".  Sign up information is provided on this After Visit Summary.  MyChart is used to connect with patients for Virtual Visits (Telemedicine).  Patients are able to view lab/test results, encounter notes, upcoming appointments, etc.  Non-urgent messages can be sent to your provider as well.   To learn more about what you can do with MyChart, go to [email or web address].    Your next appointment:   12 month(s)  The format for your next appointment:   In Person  Provider:   You may see Skeet Latch, MD or one of the following Advanced Practice Providers on your designated Care Team:    [place], PA-C  Coletta Memos, FNP

## 2020-05-04 ENCOUNTER — Ambulatory Visit (HOSPITAL_COMMUNITY)
Admission: RE | Admit: 2020-05-04 | Discharge: 2020-05-04 | Disposition: A | Discharge status: Home / Self Care | Payer: Medicare Other | Source: Ambulatory Visit | Attending: Internal Medicine | Admitting: Internal Medicine

## 2020-05-04 ENCOUNTER — Other Ambulatory Visit: Payer: Self-pay

## 2020-05-04 ENCOUNTER — Other Ambulatory Visit (HOSPITAL_COMMUNITY): Payer: Self-pay | Admitting: Cardiovascular Disease

## 2020-05-04 DIAGNOSIS — I6523 Occlusion and stenosis of bilateral carotid arteries: Secondary | ICD-10-CM | POA: Diagnosis not present

## 2020-05-04 DIAGNOSIS — I6521 Occlusion and stenosis of right carotid artery: Secondary | ICD-10-CM

## 2020-06-01 DIAGNOSIS — M810 Age-related osteoporosis without current pathological fracture: Secondary | ICD-10-CM | POA: Diagnosis not present

## 2020-06-01 DIAGNOSIS — N1831 Chronic kidney disease, stage 3a: Secondary | ICD-10-CM | POA: Diagnosis not present

## 2020-06-01 DIAGNOSIS — K219 Gastro-esophageal reflux disease without esophagitis: Secondary | ICD-10-CM | POA: Diagnosis not present

## 2020-06-01 DIAGNOSIS — G4700 Insomnia, unspecified: Secondary | ICD-10-CM | POA: Diagnosis not present

## 2020-06-01 DIAGNOSIS — I480 Paroxysmal atrial fibrillation: Secondary | ICD-10-CM | POA: Diagnosis not present

## 2020-06-01 DIAGNOSIS — I10 Essential (primary) hypertension: Secondary | ICD-10-CM | POA: Diagnosis not present

## 2020-06-01 DIAGNOSIS — E7800 Pure hypercholesterolemia, unspecified: Secondary | ICD-10-CM | POA: Diagnosis not present

## 2020-06-12 IMAGING — CT CT RENAL STONE PROTOCOL
2 of 4 series · 15 of 46 positions shown, 17 images · non-contrast
Comparison: None.

CLINICAL DATA: Gross hematuria.

EXAM:
CT ABDOMEN AND PELVIS WITHOUT CONTRAST
TECHNIQUE: Multidetector CT imaging of the abdomen and pelvis was performed
following the standard protocol without IV contrast.

[Series 2: axial st · axial · 0.66mm/px · z∈[-572,-182]mm · 12 of 88 slices shown, 14 images]
[im 5/88  soft-tissue]
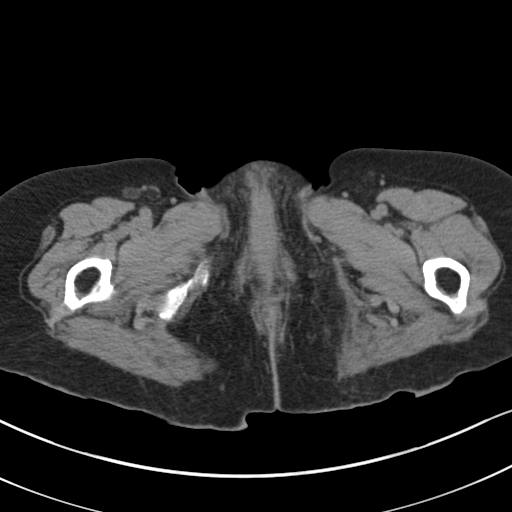
[im 5/88  bone]
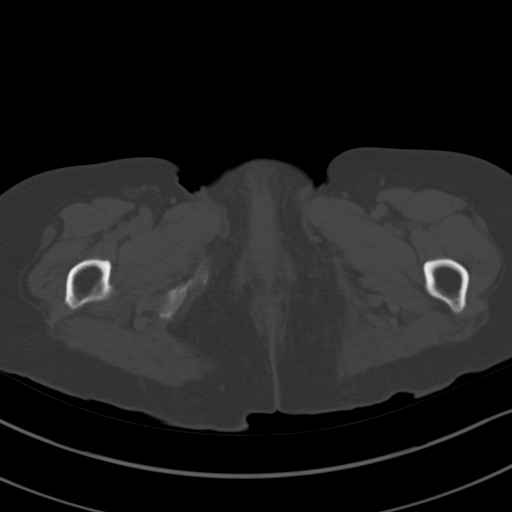
[im 14/88  soft-tissue]
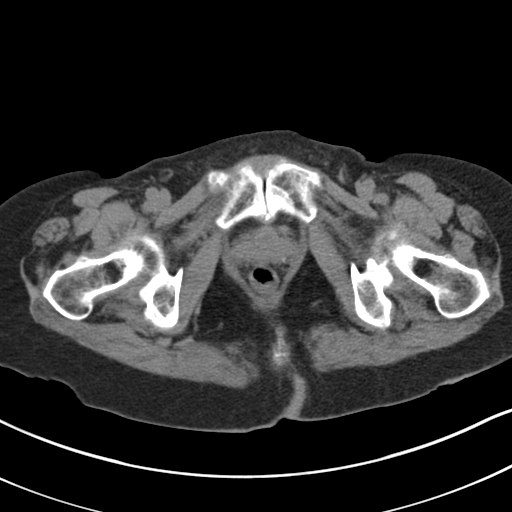
[im 19/88  soft-tissue]
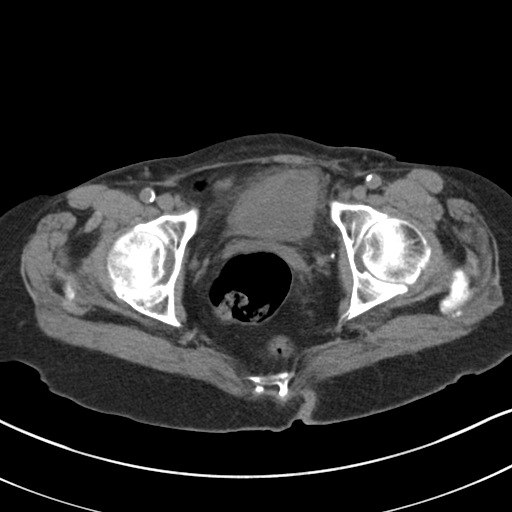
[im 28/88  soft-tissue]
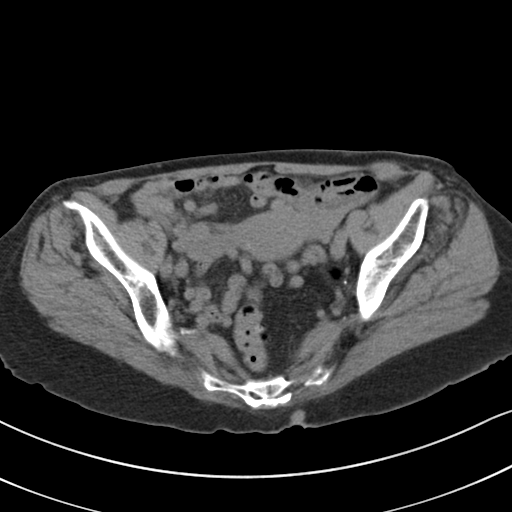
[im 33/88  soft-tissue]
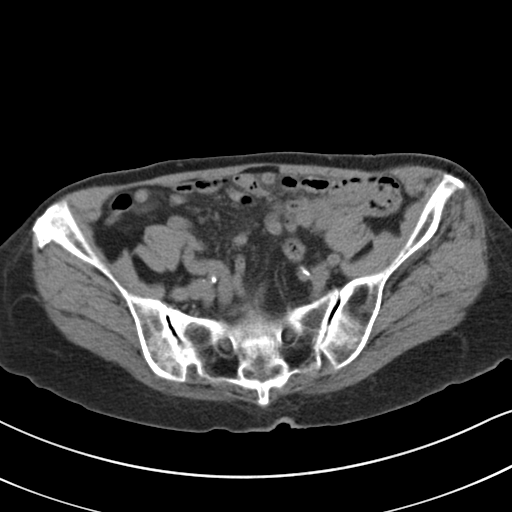
[im 42/88  soft-tissue]
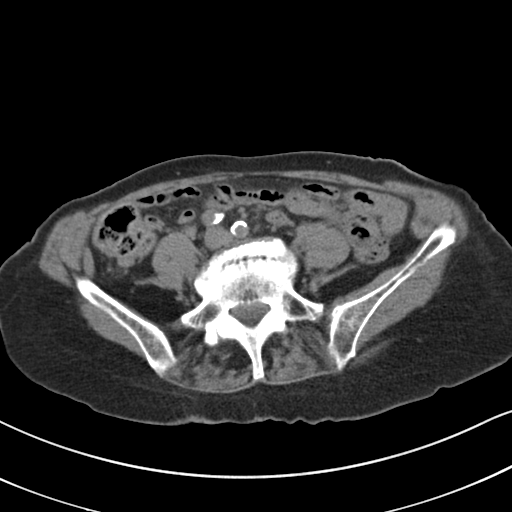
[im 46/88  soft-tissue]
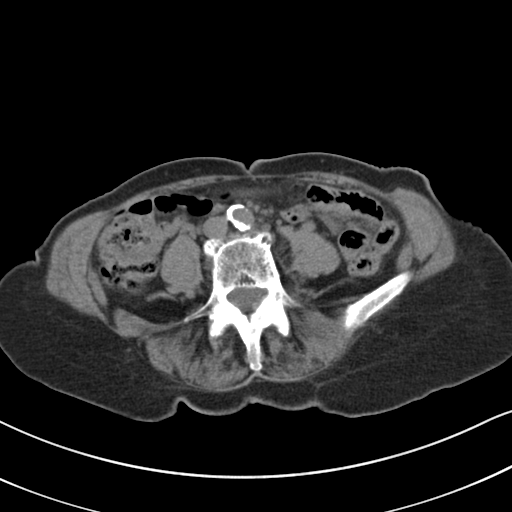
[im 55/88  soft-tissue]
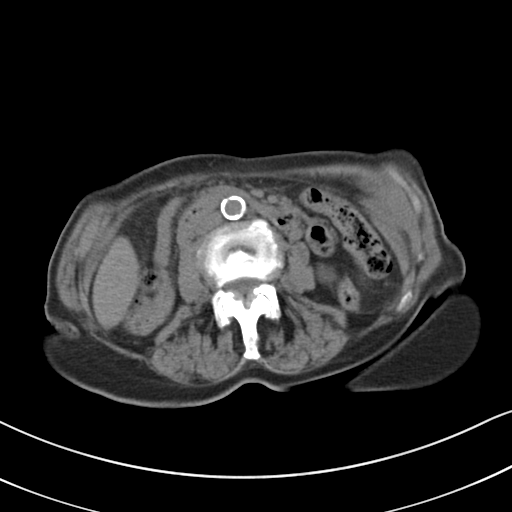
[im 60/88  soft-tissue]
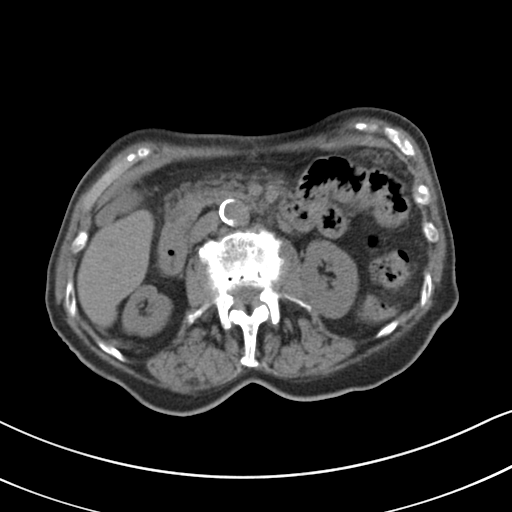
[im 60/88  bone]
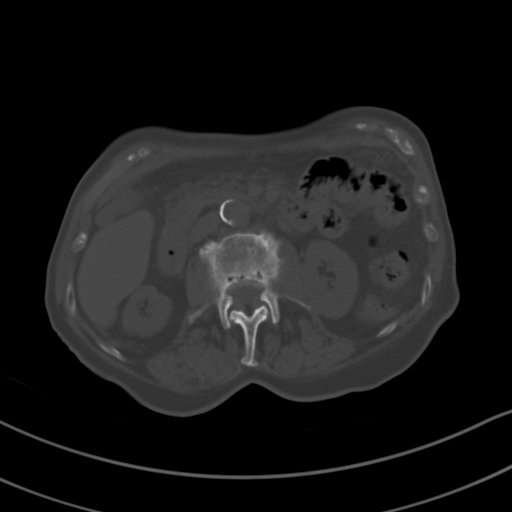
[im 69/88  soft-tissue]
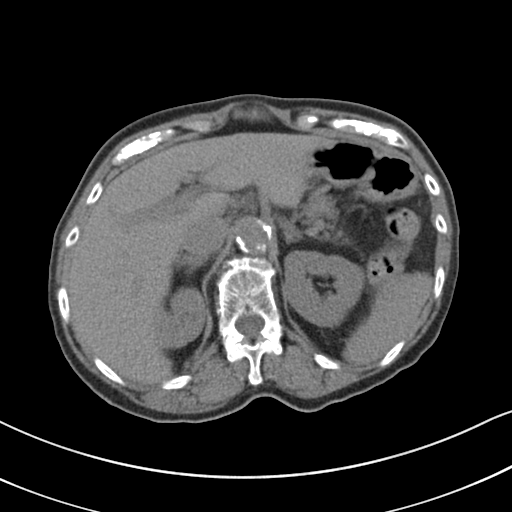
[im 74/88  soft-tissue]
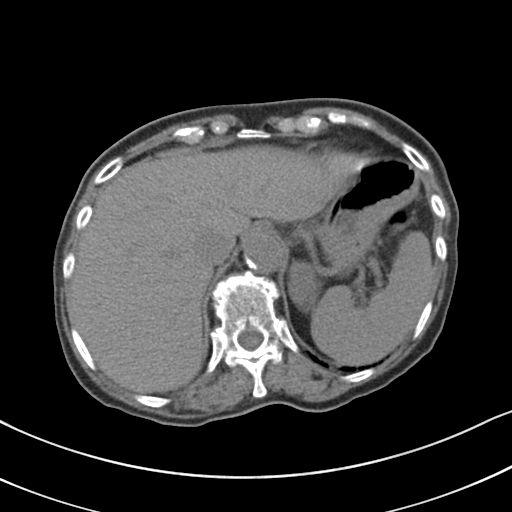
[im 83/88  soft-tissue]
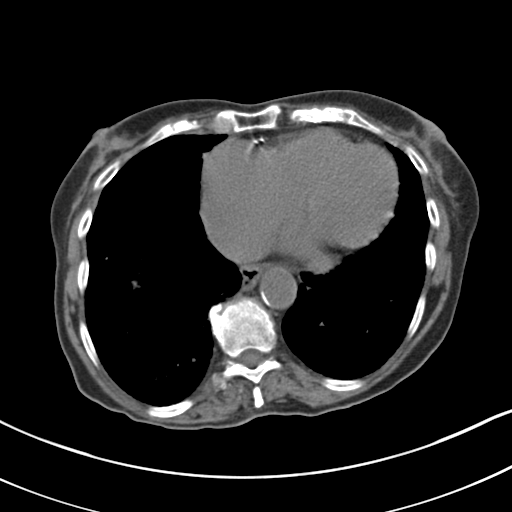

[Series 5: coronal · coronal · 0.61mm/px · 3 of 97 slices shown]
[im 33/97  soft-tissue]
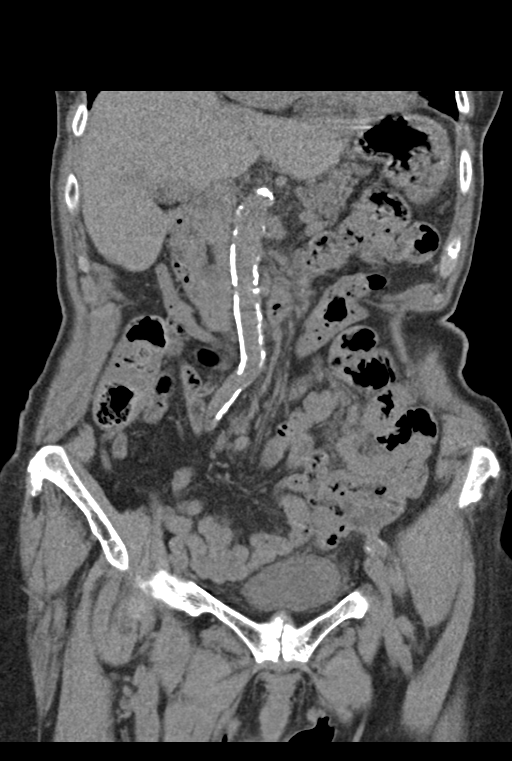
[im 43/97  soft-tissue]
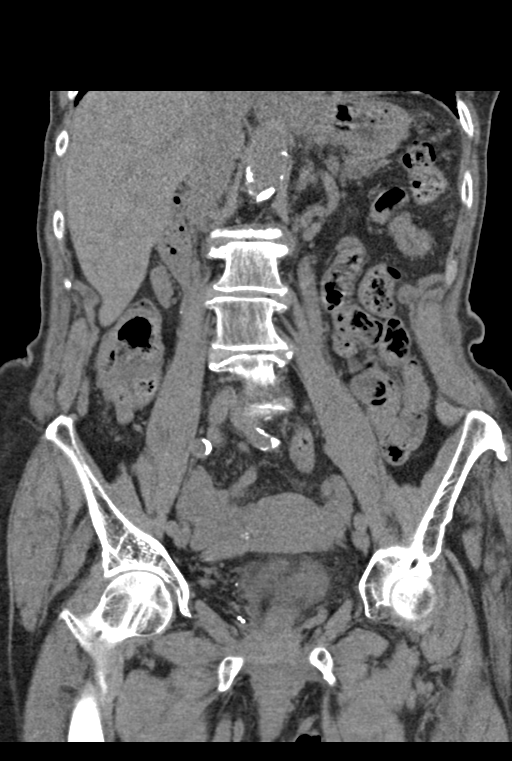
[im 54/97  soft-tissue]
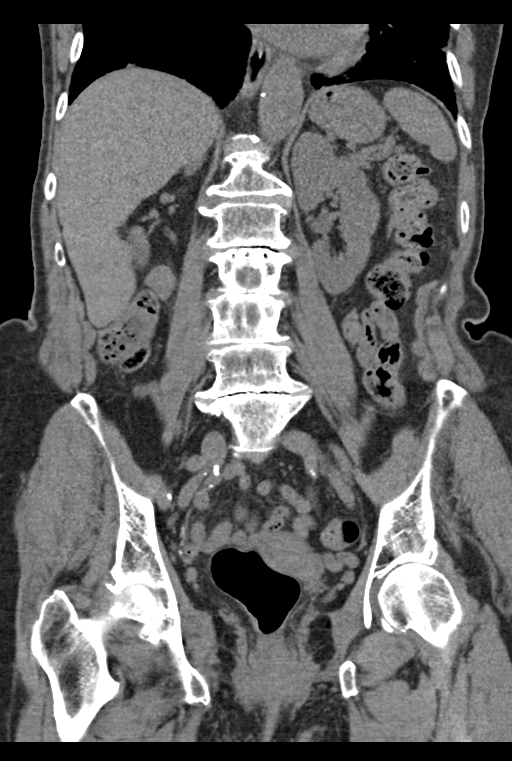

[15 of 46 positions shown; findings below may reference images not displayed]

FINDINGS: Lower chest: Advanced changes of interstitial lung disease. No
infiltrates or effusions. Calcified granuloma noted at the right
lung base.

Aortic and coronary artery calcifications are noted. No pericardial
effusion.

Hepatobiliary: No focal hepatic lesions are identified without
contrast. The gallbladder is grossly normal. No common bile duct
dilatation.

Pancreas: No mass, inflammation or ductal dilatation.

Spleen: Normal size. No focal lesions.

Adrenals/Urinary Tract: Adrenal glands and kidneys are unremarkable.
No renal calculi, obvious renal mass or hydronephrosis. No
obstructing ureteral calculi.

Thick walled bladder possibly asymmetrically thick on the right
side. A discrete bladder mass is not identified. There is high
attenuation material in the bladder which could suggest hematoma.

Stomach/Bowel: The stomach, duodenum, small bowel and colon are
grossly normal without oral contrast. No acute inflammatory process,
mass lesion or obstructive findings. The terminal ileum and appendix
appear normal. Colonic diverticulosis without findings for acute
diverticulitis.

Vascular/Lymphatic: Advanced atherosclerotic calcifications
involving the aorta and iliac arteries. No focal aneurysm.

No mesenteric or retroperitoneal mass or adenopathy.

Reproductive: The uterus and ovaries are unremarkable.

Other: No pelvic mass or adenopathy. No free pelvic fluid
collections. No inguinal mass or adenopathy. No abdominal wall
hernia or subcutaneous lesions.

Musculoskeletal: No significant bony findings. Age related
degenerative changes involving the spine and hips
IMPRESSION: 1. No renal, ureteral or bladder calculi. No obvious renal or
bladder mass.
2. Thick walled bladder with high attenuation material in the
bladder which could suggest hematoma. A discrete bladder mass is not
identified without contrast.
3. Advanced pulmonary interstitial lung disease.
4. Advanced atherosclerotic calcifications involving the aorta and
iliac arteries.
5. Colonic diverticulosis without findings for acute diverticulitis.

Aortic Atherosclerosis ([id number].B).

## 2020-06-28 DIAGNOSIS — Z961 Presence of intraocular lens: Secondary | ICD-10-CM | POA: Diagnosis not present

## 2020-06-28 DIAGNOSIS — H35373 Puckering of macula, bilateral: Secondary | ICD-10-CM | POA: Diagnosis not present

## 2020-06-28 DIAGNOSIS — H52203 Unspecified astigmatism, bilateral: Secondary | ICD-10-CM | POA: Diagnosis not present

## 2020-07-18 DIAGNOSIS — I10 Essential (primary) hypertension: Secondary | ICD-10-CM | POA: Diagnosis not present

## 2020-07-18 DIAGNOSIS — E7800 Pure hypercholesterolemia, unspecified: Secondary | ICD-10-CM | POA: Diagnosis not present

## 2020-07-18 DIAGNOSIS — I480 Paroxysmal atrial fibrillation: Secondary | ICD-10-CM | POA: Diagnosis not present

## 2020-07-18 DIAGNOSIS — D6869 Other thrombophilia: Secondary | ICD-10-CM | POA: Diagnosis not present

## 2020-07-18 DIAGNOSIS — M810 Age-related osteoporosis without current pathological fracture: Secondary | ICD-10-CM | POA: Diagnosis not present

## 2020-07-18 DIAGNOSIS — I6523 Occlusion and stenosis of bilateral carotid arteries: Secondary | ICD-10-CM | POA: Diagnosis not present

## 2020-09-01 ENCOUNTER — Other Ambulatory Visit: Payer: Self-pay | Admitting: Cardiovascular Disease

## 2020-09-04 ENCOUNTER — Other Ambulatory Visit: Payer: Self-pay | Admitting: Cardiovascular Disease

## 2020-09-04 ENCOUNTER — Other Ambulatory Visit (HOSPITAL_BASED_OUTPATIENT_CLINIC_OR_DEPARTMENT_OTHER): Payer: Self-pay

## 2020-09-04 NOTE — Telephone Encounter (Signed)
Xarelto sent to [facility] for review and approval

## 2020-09-04 NOTE — Telephone Encounter (Signed)
RX sent to pharmacy  

## 2020-09-05 NOTE — Telephone Encounter (Signed)
Prescription refill request for Xarelto received.  Indication:afib Last office visit:Mary Knapp 03/30/20 Weight: 58.9kg Age:77fScr:1.080 mg/ 01/17/2020 CrCl:30.9

## 2020-09-13 DIAGNOSIS — H35453 Secondary pigmentary degeneration, bilateral: Secondary | ICD-10-CM | POA: Diagnosis not present

## 2020-09-13 DIAGNOSIS — Z961 Presence of intraocular lens: Secondary | ICD-10-CM | POA: Diagnosis not present

## 2020-09-13 DIAGNOSIS — H353132 Nonexudative age-related macular degeneration, bilateral, intermediate dry stage: Secondary | ICD-10-CM | POA: Diagnosis not present

## 2020-09-13 DIAGNOSIS — H35373 Puckering of macula, bilateral: Secondary | ICD-10-CM | POA: Diagnosis not present

## 2020-09-13 DIAGNOSIS — H35721 Serous detachment of retinal pigment epithelium, right eye: Secondary | ICD-10-CM | POA: Diagnosis not present

## 2020-09-13 DIAGNOSIS — H35363 Drusen (degenerative) of macula, bilateral: Secondary | ICD-10-CM | POA: Diagnosis not present

## 2020-09-14 DIAGNOSIS — M810 Age-related osteoporosis without current pathological fracture: Secondary | ICD-10-CM | POA: Diagnosis not present

## 2020-09-19 DIAGNOSIS — G4700 Insomnia, unspecified: Secondary | ICD-10-CM | POA: Diagnosis not present

## 2020-09-19 DIAGNOSIS — N1831 Chronic kidney disease, stage 3a: Secondary | ICD-10-CM | POA: Diagnosis not present

## 2020-09-19 DIAGNOSIS — I480 Paroxysmal atrial fibrillation: Secondary | ICD-10-CM | POA: Diagnosis not present

## 2020-09-19 DIAGNOSIS — I10 Essential (primary) hypertension: Secondary | ICD-10-CM | POA: Diagnosis not present

## 2020-09-19 DIAGNOSIS — M810 Age-related osteoporosis without current pathological fracture: Secondary | ICD-10-CM | POA: Diagnosis not present

## 2020-09-19 DIAGNOSIS — E7800 Pure hypercholesterolemia, unspecified: Secondary | ICD-10-CM | POA: Diagnosis not present

## 2020-09-19 DIAGNOSIS — K219 Gastro-esophageal reflux disease without esophagitis: Secondary | ICD-10-CM | POA: Diagnosis not present

## 2020-11-07 DIAGNOSIS — K219 Gastro-esophageal reflux disease without esophagitis: Secondary | ICD-10-CM | POA: Diagnosis not present

## 2020-11-07 DIAGNOSIS — G4700 Insomnia, unspecified: Secondary | ICD-10-CM | POA: Diagnosis not present

## 2020-11-07 DIAGNOSIS — I10 Essential (primary) hypertension: Secondary | ICD-10-CM | POA: Diagnosis not present

## 2020-11-07 DIAGNOSIS — M810 Age-related osteoporosis without current pathological fracture: Secondary | ICD-10-CM | POA: Diagnosis not present

## 2020-11-07 DIAGNOSIS — E7800 Pure hypercholesterolemia, unspecified: Secondary | ICD-10-CM | POA: Diagnosis not present

## 2020-11-07 DIAGNOSIS — N1831 Chronic kidney disease, stage 3a: Secondary | ICD-10-CM | POA: Diagnosis not present

## 2020-11-07 DIAGNOSIS — I480 Paroxysmal atrial fibrillation: Secondary | ICD-10-CM | POA: Diagnosis not present

## 2020-11-13 ENCOUNTER — Other Ambulatory Visit (HOSPITAL_BASED_OUTPATIENT_CLINIC_OR_DEPARTMENT_OTHER): Payer: Self-pay | Admitting: Cardiovascular Disease

## 2020-11-13 DIAGNOSIS — I6521 Occlusion and stenosis of right carotid artery: Secondary | ICD-10-CM

## 2020-12-25 DIAGNOSIS — L821 Other seborrheic keratosis: Secondary | ICD-10-CM | POA: Diagnosis not present

## 2020-12-25 DIAGNOSIS — Z23 Encounter for immunization: Secondary | ICD-10-CM | POA: Diagnosis not present

## 2020-12-25 DIAGNOSIS — D225 Melanocytic nevi of trunk: Secondary | ICD-10-CM | POA: Diagnosis not present

## 2020-12-25 DIAGNOSIS — L820 Inflamed seborrheic keratosis: Secondary | ICD-10-CM | POA: Diagnosis not present

## 2020-12-25 DIAGNOSIS — L578 Other skin changes due to chronic exposure to nonionizing radiation: Secondary | ICD-10-CM | POA: Diagnosis not present

## 2020-12-25 DIAGNOSIS — L814 Other melanin hyperpigmentation: Secondary | ICD-10-CM | POA: Diagnosis not present

## 2021-01-11 ENCOUNTER — Telehealth: Payer: Self-pay | Admitting: Cardiovascular Disease

## 2021-01-11 NOTE — Telephone Encounter (Signed)
Returned call to pt. She state her children are requesting she schedule an appointment with Dr. Oval Linsey because she's been falling asleep anywhere and don't believe it's normal. Pt denies felling light headed, dizziness, swelling, CP, or any other symptoms. She state she is just doing what are children told her to do but otherwise feel fine.   Appointment scheduled for 1/13

## 2021-01-11 NOTE — Telephone Encounter (Signed)
Pt is calling because her children think she needs to get a Echocardiogram  or medication change because pt is having problems sleeping  at night and she is falling asleep as soon as she sits down. I attempted to schedule pts' yearly f/u with Dr. Oval Linsey but she refused and said she would rather speak with a nurse to get her in earlier.  Please do not call pt BEFORE 10am in the morning.

## 2021-01-30 DIAGNOSIS — D6869 Other thrombophilia: Secondary | ICD-10-CM | POA: Diagnosis not present

## 2021-01-30 DIAGNOSIS — E7800 Pure hypercholesterolemia, unspecified: Secondary | ICD-10-CM | POA: Diagnosis not present

## 2021-01-30 DIAGNOSIS — I10 Essential (primary) hypertension: Secondary | ICD-10-CM | POA: Diagnosis not present

## 2021-01-30 DIAGNOSIS — I480 Paroxysmal atrial fibrillation: Secondary | ICD-10-CM | POA: Diagnosis not present

## 2021-01-30 DIAGNOSIS — I6523 Occlusion and stenosis of bilateral carotid arteries: Secondary | ICD-10-CM | POA: Diagnosis not present

## 2021-01-30 DIAGNOSIS — Z0000 Encounter for general adult medical examination without abnormal findings: Secondary | ICD-10-CM | POA: Diagnosis not present

## 2021-01-30 DIAGNOSIS — M810 Age-related osteoporosis without current pathological fracture: Secondary | ICD-10-CM | POA: Diagnosis not present

## 2021-01-30 DIAGNOSIS — Z1389 Encounter for screening for other disorder: Secondary | ICD-10-CM | POA: Diagnosis not present

## 2021-02-09 ENCOUNTER — Ambulatory Visit (HOSPITAL_BASED_OUTPATIENT_CLINIC_OR_DEPARTMENT_OTHER): Payer: Medicare Other | Admitting: Cardiovascular Disease

## 2021-02-09 DIAGNOSIS — M545 Low back pain, unspecified: Secondary | ICD-10-CM | POA: Diagnosis not present

## 2021-02-09 DIAGNOSIS — M5136 Other intervertebral disc degeneration, lumbar region: Secondary | ICD-10-CM | POA: Diagnosis not present

## 2021-02-17 ENCOUNTER — Other Ambulatory Visit: Payer: Self-pay | Admitting: Cardiovascular Disease

## 2021-02-19 NOTE — Telephone Encounter (Signed)
Xarelto refill please

## 2021-02-21 NOTE — Telephone Encounter (Signed)
Prescription refill request for Xarelto received.  Indication:Afib Last office visit:3/22 Weight:58.9 kg Age:88 Scr:0.9 CrCl:37.09 ml/min  Prescription refilled

## 2021-03-21 DIAGNOSIS — H353132 Nonexudative age-related macular degeneration, bilateral, intermediate dry stage: Secondary | ICD-10-CM | POA: Diagnosis not present

## 2021-03-21 DIAGNOSIS — H35721 Serous detachment of retinal pigment epithelium, right eye: Secondary | ICD-10-CM | POA: Diagnosis not present

## 2021-03-21 DIAGNOSIS — H35373 Puckering of macula, bilateral: Secondary | ICD-10-CM | POA: Diagnosis not present

## 2021-03-21 DIAGNOSIS — H35363 Drusen (degenerative) of macula, bilateral: Secondary | ICD-10-CM | POA: Diagnosis not present

## 2021-04-04 DIAGNOSIS — M810 Age-related osteoporosis without current pathological fracture: Secondary | ICD-10-CM | POA: Diagnosis not present

## 2021-05-15 NOTE — Progress Notes (Incomplete)
? ? ?Cardiology Office Note ? ? ?Date:  05/15/2021  ? ?ID:  Mary Knapp, DOB 02/04/1926, MRN [id number] ? ?PCP:  Lavone Orn, MD  ?Cardiologist:   Madelin Rear  ? ?No chief complaint on file. ? ? ?History of Present Illness: ?Mary Knapp is a 88 y.o. female with paroxysmal atrial fibrillation, hypertension, hyperlipidemia, carotid artery disease, who presents for follow-up.  Mary Knapp had a normal Myoview in 2007 with no evidence of ischemia. Her LVEF was 59%. She had an echo 12/01/12 that revealed LVEF 55-60% with moderate aortic regurgitation, moderate mitral regurgitation, and severe biatrial enlargement. There was also moderate to severe tricuspid regurgitation. She wore a 30 day event monitor that revealed paroxysmal atrial fibrillation.  She is asymptomatic.  She has complained of fatigue but afterr discussion of possibilities to restore sinus rhythm, she elected to continue with current rate control therapy. ? ?Rosuvastatin was reduced due to cramping.  This does seem to have helped.  Mary Knapp had a home screening and her right ABI was 0.42.  The left was 0.84.  She denies any pain or cramping in her legs.  She previously had ABIs in 2017 that revealed heterogeneous plaque but no stenosis and normal ABIs.  Therefore, given her lack of symptoms and age, no further testing was pursued.  She had repeat carotid Dopplers 03/2019 that revealed mild blockage on the L and moderate on the R unchanged from piror.  She continues to walk up to a mile.  She feels well but is afraid of falling.  She hasn't fallen.  She has been feeling generally well.  She is excited that she is able to drive her red convertible now that the weather is better.   ? ?Today, *** ? ?Past Medical History:  ?Diagnosis Date  ? Carotid artery disease ([place])   ? nonobstructive  ? Chronic anticoagulation   ? H/O atrial flutter   ? Hyperlipidemia   ? Hypertension   ? PAF (paroxysmal atrial fibrillation) ([place])   ? Valvular heart disease   ? ? ?Past  Surgical History:  ?Procedure Laterality Date  ? childbirth    ? x4  ? ? ? ?Current Outpatient Medications  ?Medication Sig Dispense Refill  ? aspirin-sod bicarb-citric acid (ALKA-SELTZER) 325 MG TBEF tablet Take 325 mg by mouth every 6 (six) hours as needed ('when I don't feel good').    ? B Complex Vitamins (VITAMIN B COMPLEX) TABS See admin instructions.    ? benazepril-hydrochlorthiazide (LOTENSIN HCT) 10-12.5 MG per tablet Take 0.5 tablets by mouth daily.     ? CRESTOR 10 MG tablet Take 10 mg by mouth daily.     ? metoprolol succinate (TOPROL-XL) 25 MG 24 hr tablet TAKE 1 TABLET EACH DAY. 90 tablet 1  ? Multiple Vitamins-Minerals (EYE VITAMINS) CAPS 2 tablets    ? XARELTO 15 MG TABS tablet TAKE 1 TABLET ONCE DAILY. 90 tablet 1  ? ?No current facility-administered medications for this visit.  ? ? ?Allergies:   Patient has no known allergies.  ? ? ?Social History:  The patient  reports that she has quit smoking. Her smoking use included cigarettes. She has never used smokeless tobacco. She reports that she does not drink alcohol and does not use drugs.  ? ?Family History:  The patient's family history includes Heart disease in her father.  ? ? ?ROS:  Please see the history of present illness.   Otherwise, review of systems are positive for none.   All  other systems are reviewed and negative.  ? ? ?PHYSICAL EXAM: ?VS:  There were no vitals taken for this visit. , BMI There is no height or weight on file to calculate BMI. ?GENERAL:  Well appearing ?HEENT: Pupils equal round and reactive, fundi not visualized, oral mucosa unremarkable ?NECK:  No jugular venous distention, waveform within normal limits, carotid upstroke brisk and symmetric, no bruits ?LUNGS:  Clear to auscultation bilaterally ?HEART:  ***Irregularly irregular.  PMI not displaced or sustained,S1 and S2 within normal limits, no S3, no S4, no clicks, no rubs, no murmurs ?ABD:  Flat, positive bowel sounds normal in frequency in pitch, no bruits, no  rebound, no guarding, no midline pulsatile mass, no hepatomegaly, no splenomegaly ?EXT:  2 plus pulses throughout, no edema, no cyanosis no clubbing ?SKIN:  No rashes no nodules ?NEURO:  Cranial nerves II through XII grossly intact, motor grossly intact throughout ?PSYCH:  Cognitively intact, oriented to person place and time ? ? ?EKG:  EKG is personally reviewed. ?05/17/2021: Sinus ***. Rate *** bpm. ?03/30/20: Atrial fibrillation.  Rate 90 bpm.  PVC.  Inferior TWI. ?03/29/19: Atrial fibrillation.  Rate 81 bpm.  Nonspecific T wave abnormalies.   ?03/27/18: Atrial fibrillation.  Rate 75 bpm.  Nonspecific T wave abnormalities.  ?08/13/16: Atrial fibrillation. Rate 81 bpm. Nonspecific T wave abnormalities. PVCs. ? ? ?Bilateral Carotid Dopplers 05/04/2020: ?Summary:  ?Right Carotid: Velocities in the right ICA are consistent with a stable  ?40-59% stenosis.  ? ?Left Carotid: The extracranial vessels were near-normal with only minimal  ?wall thickening or plaque.  ? ?Vertebrals:  Bilateral vertebral arteries demonstrate antegrade flow.  ?Subclavians: Normal flow hemodynamics were seen in bilateral subclavian arteries.  ? ?Carotid Doppler 12/27/14: ?60-79% right internal carotid artery stenosis. 1-39% left internal carotid stenosis. ? ?Echo 12/01/12: ?Study Conclusions ? ?- Left ventricle: The cavity size was normal. Wall thickness ?  was normal. Systolic function was normal. The estimated ?  ejection fraction was in the range of 55% to 60%. ?- Aortic valve: Mild regurgitation. ?- Mitral valve: Moderate regurgitation. ?- Left atrium: The atrium was moderately to severely ?  dilated. ?- Right atrium: The atrium was moderately to severely ?  dilated. ?- Atrial septum: No defect or patent foramen ovale was ?  identified. ?- Tricuspid valve: Moderate-severe regurgitation. ?- Pulmonary arteries: PA peak pressure: 35m Hg (S). ? ?Recent Labs: ?No results found for requested labs within last 8760 hours.  ? ? ?Lipid Panel ?   ?Component  Value Date/Time  ? CHOL 151 04/08/2019 1426  ? TRIG 90 04/08/2019 1426  ? HDL 60 04/08/2019 1426  ? CHOLHDL 2.5 04/08/2019 1426  ? LBlanco74 04/08/2019 1426  ? ?  ? ?Wt Readings from Last 3 Encounters:  ?03/30/20 129 lb 12.8 oz (58.9 kg)  ?05/02/19 131 lb 9.8 oz (59.7 kg)  ?03/29/19 134 lb (60.8 kg)  ?  ? ? ?ASSESSMENT AND PLAN: ? ?No problem-specific Assessment & Plan notes found for this encounter. ? ? ?# Persistent atrial fibrillation: Ms. TGreenbaumremains in atrial fibrillation.  She continues to do very well.  Rates are well-controlled and she is completely asymptomatic.  Continue metoprolol and anticoagulation with Xarelto.  Xarelto dose reduced due to age and weight.  This patients CHA2DS2-VASc Score and unadjusted Ischemic Stroke Rate (% per year) is equal to 4.8 % stroke rate/year from a score of 4 ? ?Above score calculated as 1 point each if present [CHF, HTN, DM, Vascular=MI/PAD/Aortic Plaque, Age if 660-74 or  Female] ?Above score calculated as 2 points each if present [Age > 75, or Stroke/TIA/TE] ? ?# Hypertension:  ?Blood pressure remains well-controlled. Continue benazepril, hydrochlorothiazide, and metoprolol.  Renal function stable on current regimen. ? ?# Carotid stenosis: ?# Hyperlipidemia:  ?She has mild ICA stenosis on the L and moderate on the R 02/2017.  Stable in 2021.  LDL at goal.  Continue aspirin and rosuvastatin.   ? ?Current medicines are reviewed at length with the patient today.  The patient does not have concerns regarding medicines. ? ?The following changes have been made:  no change ? ?Labs/ tests ordered today include:  ? ?No orders of the defined types were placed in this encounter. ? ? ? ?Disposition:   FU with Tiffany C. Oval Linsey, MD, [facility] in ***1 year ? ?I,Mathew Stumpf,acting as a Education administrator for [place], MD.,have documented all relevant documentation on the behalf of Skeet Latch, MD,as directed by  Skeet Latch, MD while in the presence of Skeet Latch,  MD. ? ?*** ? ?Signed, ?Tiffany C. Oval Linsey, MD, [facility]  ?05/15/2021 10:56 AM    ?[place] ?

## 2021-05-16 ENCOUNTER — Encounter (HOSPITAL_BASED_OUTPATIENT_CLINIC_OR_DEPARTMENT_OTHER): Payer: Self-pay

## 2021-05-17 ENCOUNTER — Encounter (HOSPITAL_BASED_OUTPATIENT_CLINIC_OR_DEPARTMENT_OTHER): Payer: Medicare Other

## 2021-05-17 ENCOUNTER — Ambulatory Visit (HOSPITAL_BASED_OUTPATIENT_CLINIC_OR_DEPARTMENT_OTHER): Payer: Medicare Other | Admitting: Cardiovascular Disease

## 2021-05-17 NOTE — Progress Notes (Signed)
?  ?Cardiology Office Note ? ? ?Date:  05/18/2021  ? ?ID:  Mary Knapp, DOB 10-10-1926, MRN [id number] ? ?PCP:  Lavone Orn, MD  ?Cardiologist:   Minus Breeding, MD ? ? ?Chief Complaint  ?Patient presents with  ? Atrial Fibrillation  ? ? ?  ?History of Present Illness: ?Mary Knapp is a 88 y.o. female who presents for follow up of paroxysmal atrial fibrillation, hypertension, hyperlipidemia, carotid artery disease.  She was seeing Dr. Oval Linsey.  She had a normal Myoview in 2007 with no evidence of ischemia. Her LVEF was 59%. She had an echo 12/01/12 that revealed LVEF 55-60% with moderate aortic regurgitation, moderate mitral regurgitation, and severe biatrial enlargement. There was also moderate to severe tricuspid regurgitation. She wore a 30 day event monitor that revealed paroxysmal atrial fibrillation.   She has had ABIs with 0.4 to on the right and 0.84 on the left. ? ?She is moving here because she lives right around the corner.  She has no acute complaints.  She gets around in her own house independently.  She is using a cane although she does not like it.  She is not having any new symptoms such as chest pressure, neck or arm discomfort.  She does not notice her atrial fibrillation.  She is not having any weight gain or edema.  She has not had any falls.  She tolerates a blood thinner and has had no bleeding issues.  She has some leg cramping but this is not changed from previous. ? ?Past Medical History:  ?Diagnosis Date  ? Carotid artery disease ([place])   ? nonobstructive  ? Chronic anticoagulation   ? H/O atrial flutter   ? Hyperlipidemia   ? Hypertension   ? PAF (paroxysmal atrial fibrillation) ([place])   ? Valvular heart disease   ? ? ?Past Surgical History:  ?Procedure Laterality Date  ? childbirth    ? x4  ? ? ? ?Current Outpatient Medications  ?Medication Sig Dispense Refill  ? aspirin-sod bicarb-citric acid (ALKA-SELTZER) 325 MG TBEF tablet Take 325 mg by mouth every 6 (six) hours as needed ('when I  don't feel good').    ? B Complex Vitamins (VITAMIN B COMPLEX) TABS See admin instructions.    ? benazepril-hydrochlorthiazide (LOTENSIN HCT) 10-12.5 MG per tablet Take 0.5 tablets by mouth daily.     ? metoprolol succinate (TOPROL-XL) 25 MG 24 hr tablet TAKE 1 TABLET EACH DAY. 90 tablet 1  ? XARELTO 15 MG TABS tablet TAKE 1 TABLET ONCE DAILY. 90 tablet 1  ? CRESTOR 10 MG tablet Take 10 mg by mouth daily.  (Patient not taking: Reported on 05/18/2021)    ? Multiple Vitamins-Minerals (EYE VITAMINS) CAPS 2 tablets (Patient not taking: Reported on 05/18/2021)    ? ?No current facility-administered medications for this visit.  ? ? ?Allergies:   Rosuvastatin  ? ? ?ROS:  Please see the history of present illness.   Otherwise, review of systems are positive for none.   All other systems are reviewed and negative.  ? ? ?PHYSICAL EXAM: ?VS:  BP 100/72   Pulse 84   Ht '5\' 2"'$  (1.575 m)   Wt 122 lb 9.6 oz (55.6 kg)   SpO2 98%   BMI 22.42 kg/m?  , BMI Body mass index is 22.42 kg/m?. ?GENERAL:  Well appearing ?NECK:  No jugular venous distention, waveform within normal limits, carotid upstroke brisk and symmetric, no bruits, no thyromegaly ?LUNGS:  Clear to auscultation bilaterally ?CHEST:  Unremarkable ?  HEART:  PMI not displaced or sustained,S1 and S2 within normal limits, no S3, clicks, no rubs, no murmurs, irregular  ?ABD:  Flat, positive bowel sounds normal in frequency in pitch, no bruits, no rebound, no guarding, no midline pulsatile mass, no hepatomegaly, no splenomegaly ?EXT:  2 plus pulses upper and diminished dorsalis pedis and posterior tibialis bilateral lower, no edema, no cyanosis no clubbing ? ? ? ?EKG:  EKG is ordered today. ?The ekg ordered today demonstrates atrial fibrillation, rate 84, axis within normal limits, intervals within normal limits, premature ventricular contractions.  No acute ST-T wave changes. ? ? ?Recent Labs: ?No results found for requested labs within last 8760 hours.  ? ? ?Lipid Panel ?    ?Component Value Date/Time  ? CHOL 151 04/08/2019 1426  ? TRIG 90 04/08/2019 1426  ? HDL 60 04/08/2019 1426  ? CHOLHDL 2.5 04/08/2019 1426  ? [place] 74 04/08/2019 1426  ? ?  ? ?Wt Readings from Last 3 Encounters:  ?05/18/21 122 lb 9.6 oz (55.6 kg)  ?03/30/20 129 lb 12.8 oz (58.9 kg)  ?05/02/19 131 lb 9.8 oz (59.7 kg)  ?  ? ? ?Other studies Reviewed: ?Additional studies/ records that were reviewed today include: Labs.  Extensive review of previous records as she is new to me ?Review of the above records demonstrates:  Please see elsewhere in the note.   ? ? ?ASSESSMENT AND PLAN: ? ?Persistent atrial fibrillation:  Mary Knapp has a CHA2DS2 - VASc score of 4.  She tolerates this rhythm and has good rate control.  She tolerates anticoagulation.  No change in therapy.  ? ?Hypertension: The blood pressure is at target.  No change in therapy.  It is actually been slightly low but at this point I am not can make any changes. ?  ?Carotid stenosis:  She has mild ICA stenosis on the L and moderate on the R 02/2017.  Stable in 2021.  She is having follow-up with [place] today. ? ?Hyperlipidemia:   LDL was 112 with an HDL of 56.  I think this is reasonable.  No change in therapy. ? ? ? ?Current medicines are reviewed at length with the patient today.  The patient does not have concerns regarding medicines. ? ?The following changes have been made:  no change ? ?Labs/ tests ordered today include: None ? ?Orders Placed This Encounter  ?Procedures  ? EKG 12-Lead  ? ? ? ?Disposition:   FU with me in 12 months.   ? ? ?Signed, ?Minus Breeding, MD  ?05/18/2021 3:06 PM    ?[place] ? ? ? ?

## 2021-05-18 ENCOUNTER — Ambulatory Visit (HOSPITAL_COMMUNITY)
Admission: RE | Admit: 2021-05-18 | Discharge: 2021-05-18 | Disposition: A | Discharge status: Home / Self Care | Payer: Medicare Other | Source: Ambulatory Visit | Attending: Internal Medicine | Admitting: Internal Medicine

## 2021-05-18 ENCOUNTER — Ambulatory Visit: Payer: Medicare Other | Admitting: Cardiology

## 2021-05-18 ENCOUNTER — Encounter: Payer: Self-pay | Admitting: Cardiology

## 2021-05-18 VITALS — BP 100/72 | HR 84 | Ht 62.0 in | Wt 122.6 lb

## 2021-05-18 DIAGNOSIS — I4819 Other persistent atrial fibrillation: Secondary | ICD-10-CM

## 2021-05-18 DIAGNOSIS — E785 Hyperlipidemia, unspecified: Secondary | ICD-10-CM

## 2021-05-18 DIAGNOSIS — I6523 Occlusion and stenosis of bilateral carotid arteries: Secondary | ICD-10-CM

## 2021-05-18 DIAGNOSIS — I6521 Occlusion and stenosis of right carotid artery: Secondary | ICD-10-CM | POA: Diagnosis not present

## 2021-05-18 NOTE — Patient Instructions (Signed)
Medication Instructions:  ?No changes ?*If you need a refill on your cardiac medications before your next appointment, please call your pharmacy* ? ? ?Lab Work: ?None ordered ?If you have labs (blood work) drawn today and your tests are completely normal, you will receive your results only by: ?MyChart Message (if you have MyChart) OR ?A paper copy in the mail ?If you have any lab test that is abnormal or we need to change your treatment, we will call you to review the results. ? ? ?Testing/Procedures: ?None ordered ? ? ?Follow-Up: ?At [facility], you and your health needs are our priority.  As part of our continuing mission to provide you with exceptional heart care, we have created designated Provider Care Teams.  These Care Teams include your primary Cardiologist (physician) and Advanced Practice Providers (APPs -  Physician Assistants and Nurse Practitioners) who all work together to provide you with the care you need, when you need it. ? ?We recommend signing up for the patient portal called "MyChart".  Sign up information is provided on this After Visit Summary.  MyChart is used to connect with patients for Virtual Visits (Telemedicine).  Patients are able to view lab/test results, encounter notes, upcoming appointments, etc.  Non-urgent messages can be sent to your provider as well.   ?To learn more about what you can do with MyChart, go to [email or web address].   ? ?Your next appointment:   ?6 month(s) ? ?The format for your next appointment:   ?In Person ? ?Provider:   ?Dr. Percival Spanish ? ?Important Information About Sugar ? ? ? ? ? ? ?

## 2021-05-21 ENCOUNTER — Encounter (HOSPITAL_COMMUNITY): Payer: Medicare Other

## 2021-05-22 DIAGNOSIS — G4700 Insomnia, unspecified: Secondary | ICD-10-CM | POA: Diagnosis not present

## 2021-05-22 DIAGNOSIS — E7800 Pure hypercholesterolemia, unspecified: Secondary | ICD-10-CM | POA: Diagnosis not present

## 2021-05-22 DIAGNOSIS — M810 Age-related osteoporosis without current pathological fracture: Secondary | ICD-10-CM | POA: Diagnosis not present

## 2021-05-22 DIAGNOSIS — I480 Paroxysmal atrial fibrillation: Secondary | ICD-10-CM | POA: Diagnosis not present

## 2021-05-22 DIAGNOSIS — N1831 Chronic kidney disease, stage 3a: Secondary | ICD-10-CM | POA: Diagnosis not present

## 2021-05-22 DIAGNOSIS — K219 Gastro-esophageal reflux disease without esophagitis: Secondary | ICD-10-CM | POA: Diagnosis not present

## 2021-05-22 DIAGNOSIS — I10 Essential (primary) hypertension: Secondary | ICD-10-CM | POA: Diagnosis not present

## 2021-06-18 ENCOUNTER — Other Ambulatory Visit: Payer: Self-pay | Admitting: Cardiovascular Disease

## 2021-06-18 NOTE — Telephone Encounter (Signed)
Prescription refill request for Xarelto received.  Indication:Afib Last office visit:4/23 Weight:55.6 Age:88 Scr:0.9 CrCl:34.28 ml/min  Prescription refilled

## 2021-06-27 DIAGNOSIS — I10 Essential (primary) hypertension: Secondary | ICD-10-CM | POA: Diagnosis not present

## 2021-06-27 DIAGNOSIS — M810 Age-related osteoporosis without current pathological fracture: Secondary | ICD-10-CM | POA: Diagnosis not present

## 2021-06-27 DIAGNOSIS — N1831 Chronic kidney disease, stage 3a: Secondary | ICD-10-CM | POA: Diagnosis not present

## 2021-06-27 DIAGNOSIS — E7800 Pure hypercholesterolemia, unspecified: Secondary | ICD-10-CM | POA: Diagnosis not present

## 2021-06-27 DIAGNOSIS — I480 Paroxysmal atrial fibrillation: Secondary | ICD-10-CM | POA: Diagnosis not present

## 2021-06-27 DIAGNOSIS — K219 Gastro-esophageal reflux disease without esophagitis: Secondary | ICD-10-CM | POA: Diagnosis not present

## 2021-07-18 DIAGNOSIS — H903 Sensorineural hearing loss, bilateral: Secondary | ICD-10-CM | POA: Diagnosis not present

## 2021-07-30 DIAGNOSIS — N1831 Chronic kidney disease, stage 3a: Secondary | ICD-10-CM | POA: Diagnosis not present

## 2021-07-30 DIAGNOSIS — E7800 Pure hypercholesterolemia, unspecified: Secondary | ICD-10-CM | POA: Diagnosis not present

## 2021-07-30 DIAGNOSIS — I10 Essential (primary) hypertension: Secondary | ICD-10-CM | POA: Diagnosis not present

## 2021-07-30 DIAGNOSIS — I480 Paroxysmal atrial fibrillation: Secondary | ICD-10-CM | POA: Diagnosis not present

## 2021-07-30 DIAGNOSIS — K219 Gastro-esophageal reflux disease without esophagitis: Secondary | ICD-10-CM | POA: Diagnosis not present

## 2021-08-20 ENCOUNTER — Other Ambulatory Visit: Payer: Self-pay | Admitting: Cardiovascular Disease

## 2021-08-21 NOTE — Telephone Encounter (Signed)
Rx(s) sent to pharmacy electronically.  

## 2021-09-19 DIAGNOSIS — H35721 Serous detachment of retinal pigment epithelium, right eye: Secondary | ICD-10-CM | POA: Diagnosis not present

## 2021-09-19 DIAGNOSIS — H35373 Puckering of macula, bilateral: Secondary | ICD-10-CM | POA: Diagnosis not present

## 2021-09-19 DIAGNOSIS — H35341 Macular cyst, hole, or pseudohole, right eye: Secondary | ICD-10-CM | POA: Diagnosis not present

## 2021-09-19 DIAGNOSIS — H35363 Drusen (degenerative) of macula, bilateral: Secondary | ICD-10-CM | POA: Diagnosis not present

## 2021-09-19 DIAGNOSIS — Z961 Presence of intraocular lens: Secondary | ICD-10-CM | POA: Diagnosis not present

## 2021-09-19 DIAGNOSIS — H35453 Secondary pigmentary degeneration, bilateral: Secondary | ICD-10-CM | POA: Diagnosis not present

## 2021-09-19 DIAGNOSIS — H353132 Nonexudative age-related macular degeneration, bilateral, intermediate dry stage: Secondary | ICD-10-CM | POA: Diagnosis not present

## 2021-10-15 DIAGNOSIS — K219 Gastro-esophageal reflux disease without esophagitis: Secondary | ICD-10-CM | POA: Diagnosis not present

## 2021-10-15 DIAGNOSIS — I10 Essential (primary) hypertension: Secondary | ICD-10-CM | POA: Diagnosis not present

## 2021-10-15 DIAGNOSIS — N1831 Chronic kidney disease, stage 3a: Secondary | ICD-10-CM | POA: Diagnosis not present

## 2021-10-15 DIAGNOSIS — E7800 Pure hypercholesterolemia, unspecified: Secondary | ICD-10-CM | POA: Diagnosis not present

## 2021-10-15 DIAGNOSIS — M810 Age-related osteoporosis without current pathological fracture: Secondary | ICD-10-CM | POA: Diagnosis not present

## 2021-10-15 DIAGNOSIS — I480 Paroxysmal atrial fibrillation: Secondary | ICD-10-CM | POA: Diagnosis not present

## 2021-11-08 DIAGNOSIS — M545 Low back pain, unspecified: Secondary | ICD-10-CM | POA: Diagnosis not present

## 2021-11-08 DIAGNOSIS — M5136 Other intervertebral disc degeneration, lumbar region: Secondary | ICD-10-CM | POA: Diagnosis not present

## 2021-11-12 DIAGNOSIS — R2689 Other abnormalities of gait and mobility: Secondary | ICD-10-CM | POA: Diagnosis not present

## 2021-11-16 DIAGNOSIS — R2689 Other abnormalities of gait and mobility: Secondary | ICD-10-CM | POA: Diagnosis not present

## 2021-11-26 DIAGNOSIS — R2689 Other abnormalities of gait and mobility: Secondary | ICD-10-CM | POA: Diagnosis not present

## 2021-12-14 DIAGNOSIS — R2689 Other abnormalities of gait and mobility: Secondary | ICD-10-CM | POA: Diagnosis not present

## 2021-12-27 DIAGNOSIS — R2689 Other abnormalities of gait and mobility: Secondary | ICD-10-CM | POA: Diagnosis not present

## 2021-12-28 ENCOUNTER — Ambulatory Visit: Payer: Medicare Other | Admitting: Cardiology

## 2021-12-30 DIAGNOSIS — I10 Essential (primary) hypertension: Secondary | ICD-10-CM | POA: Insufficient documentation

## 2021-12-30 DIAGNOSIS — E785 Hyperlipidemia, unspecified: Secondary | ICD-10-CM | POA: Insufficient documentation

## 2021-12-30 DIAGNOSIS — I6521 Occlusion and stenosis of right carotid artery: Secondary | ICD-10-CM | POA: Insufficient documentation

## 2021-12-30 DIAGNOSIS — I4819 Other persistent atrial fibrillation: Secondary | ICD-10-CM | POA: Insufficient documentation

## 2021-12-30 NOTE — Progress Notes (Deleted)
Cardiology Office Note   Date:  12/30/2021   ID:  Mary Knapp, DOB 1926-03-09, MRN [id number]  PCP:  Mary Orn, MD  Cardiologist:   Mary Breeding, MD   No chief complaint on file.     History of Present Illness: Mary Knapp is a 88 y.o. female who presents for follow up of paroxysmal atrial fibrillation, hypertension, hyperlipidemia, carotid artery disease.  She was seeing Mary Knapp.  She had a normal Myoview in 2007 with no evidence of ischemia. Her LVEF was 59%. She had an echo 12/01/12 that revealed LVEF 55-60% with moderate aortic regurgitation, moderate mitral regurgitation, and severe biatrial enlargement. There was also moderate to severe tricuspid regurgitation. She wore a 30 day event monitor that revealed paroxysmal atrial fibrillation.   She has had ABIs with 0.4 to on the right and 0.84 on the left.  ***  *** She is moving here because she lives right around the corner.  She has no acute complaints.  She gets around in her own house independently.  She is using a cane although she does not like it.  She is not having any new symptoms such as chest pressure, neck or arm discomfort.  She does not notice her atrial fibrillation.  She is not having any weight gain or edema.  She has not had any falls.  She tolerates a blood thinner and has had no bleeding issues.  She has some leg cramping but this is not changed from previous.  Past Medical History:  Diagnosis Date   Carotid artery disease (HCC)    nonobstructive   Chronic anticoagulation    H/O atrial flutter    Hyperlipidemia    Hypertension    PAF (paroxysmal atrial fibrillation) (HCC)    Valvular heart disease     Past Surgical History:  Procedure Laterality Date   childbirth     x4     Current Outpatient Medications  Medication Sig Dispense Refill   aspirin-sod bicarb-citric acid (ALKA-SELTZER) 325 MG TBEF tablet Take 325 mg by mouth every 6 (six) hours as needed ('when I don't feel good').      B Complex Vitamins (VITAMIN B COMPLEX) TABS See admin instructions.     benazepril-hydrochlorthiazide (LOTENSIN HCT) 10-12.5 MG per tablet Take 0.5 tablets by mouth daily.      CRESTOR 10 MG tablet Take 10 mg by mouth daily.  (Patient not taking: Reported on 05/18/2021)     metoprolol succinate (TOPROL-XL) 25 MG 24 hr tablet TAKE 1 TABLET BY MOUTH EACH DAY. 90 tablet 1   Multiple Vitamins-Minerals (EYE VITAMINS) CAPS 2 tablets (Patient not taking: Reported on 05/18/2021)     XARELTO 15 MG TABS tablet TAKE ONE TABLET BY MOUTH ONCE DAILY 90 tablet 1   No current facility-administered medications for this visit.    Allergies:   Rosuvastatin    ROS:  Please see the history of present illness.   Otherwise, review of systems are positive for ***.   All other systems are reviewed and negative.    PHYSICAL EXAM: VS:  There were no vitals taken for this visit. , BMI There is no height or weight on file to calculate BMI. GENERAL:  Well appearing NECK:  No jugular venous distention, waveform within normal limits, carotid upstroke brisk and symmetric, no bruits, no thyromegaly LUNGS:  Clear to auscultation bilaterally CHEST:  Unremarkable HEART:  PMI not displaced or sustained,S1 and S2 within normal limits, no S3, no clicks,  no rubs, *** murmurs, irregular ABD:  Flat, positive bowel sounds normal in frequency in pitch, no bruits, no rebound, no guarding, no midline pulsatile mass, no hepatomegaly, no splenomegaly EXT:  2 plus pulses throughout, no edema, no cyanosis no clubbing    ***GENERAL:  Well appearing NECK:  No jugular venous distention, waveform within normal limits, carotid upstroke brisk and symmetric, no bruits, no thyromegaly LUNGS:  Clear to auscultation bilaterally CHEST:  Unremarkable HEART:  PMI not displaced or sustained,S1 and S2 within normal limits, no S3, clicks, no rubs, no murmurs, irregular  ABD:  Flat, positive bowel sounds normal in frequency in pitch, no bruits, no  rebound, no guarding, no midline pulsatile mass, no hepatomegaly, no splenomegaly EXT:  2 plus pulses upper and diminished dorsalis pedis and posterior tibialis bilateral lower, no edema, no cyanosis no clubbing    EKG:  EKG is *** ordered today. The ekg ordered today demonstrates atrial fibrillation, rate ***, axis within normal limits, intervals within normal limits, premature ventricular contractions.  No acute ST-T wave changes.   Recent Labs: No results found for requested labs within last 365 days.    Lipid Panel    Component Value Date/Time   CHOL 151 04/08/2019 1426   TRIG 90 04/08/2019 1426   HDL 60 04/08/2019 1426   CHOLHDL 2.5 04/08/2019 1426   LDLCALC 74 04/08/2019 1426      Wt Readings from Last 3 Encounters:  05/18/21 122 lb 9.6 oz (55.6 kg)  03/30/20 129 lb 12.8 oz (58.9 kg)  05/02/19 131 lb 9.8 oz (59.7 kg)      Other studies Reviewed: Additional studies/ records that were reviewed today include: Labs.  *** Review of the above records demonstrates:  Please see elsewhere in the note.     ASSESSMENT AND PLAN:  Persistent atrial fibrillation:  Ms. Mary Knapp has a CHA2DS2 - VASc score of 4.   ***   She tolerates this rhythm and has good rate control.  She tolerates anticoagulation.  No change in therapy.   Hypertension: The blood pressure is *** at target.  No change in therapy.  It is actually been slightly low but at this point I am not can make any changes.   Carotid stenosis:     She had 40 - 59% right stenosis in April 2023.  We will follow this with Doppler in one year.  ***  She has mild ICA stenosis on the L and moderate on the R 02/2017.  Stable in 2021.  She is having follow-up with [place] today.  Hyperlipidemia:   LDL was *** 112 with an HDL of 56.  I think this is reasonable.  No change in therapy.    Current medicines are reviewed at length with the patient today.  The patient does not have concerns regarding medicines.  The following changes  have been made:  ***  Labs/ tests ordered today include:  ***  No orders of the defined types were placed in this encounter.    Disposition:   FU with me in *** months.     Signed, Mary Breeding, MD  12/30/2021 8:10 PM    [facility]

## 2022-01-01 ENCOUNTER — Ambulatory Visit: Payer: Medicare Other | Admitting: Cardiology

## 2022-01-01 DIAGNOSIS — I4819 Other persistent atrial fibrillation: Secondary | ICD-10-CM

## 2022-01-01 DIAGNOSIS — I10 Essential (primary) hypertension: Secondary | ICD-10-CM

## 2022-01-01 DIAGNOSIS — E785 Hyperlipidemia, unspecified: Secondary | ICD-10-CM

## 2022-01-01 DIAGNOSIS — I6521 Occlusion and stenosis of right carotid artery: Secondary | ICD-10-CM

## 2022-01-02 NOTE — Progress Notes (Unsigned)
Cardiology Office Note   Date:  01/03/2022   ID:  Mary Knapp, DOB [birth year], MRN [id number]  PCP:  Lavone Orn, MD  Cardiologist:   Minus Breeding, MD   Chief Complaint  Patient presents with   Extremity Weakness      History of Present Illness: Mary Knapp is a 88 y.o. female who presents for follow up of paroxysmal atrial fibrillation, hypertension, hyperlipidemia, carotid artery disease.  She was seeing Dr. Oval Linsey.  She had a normal Myoview in 2007 with no evidence of ischemia. Her LVEF was 59%. She had an echo 12/01/12 that revealed LVEF 55-60% with moderate aortic regurgitation, moderate mitral regurgitation, and severe biatrial enlargement. There was also moderate to severe tricuspid regurgitation. She wore a 30 day event monitor that revealed atrial fibrillation.     Since I last saw her biggest issue has been difficulty walking.  She just says her legs feel heavy.  It takes her a long time to walk from one place to the next.  She lives by herself in a ten room house.  Her 40 year old boyfriend recently died.  She denies any pain in her legs.  She has not had any chest pressure, neck or arm discomfort.  She has not had any palpitations, presyncope or syncope.   Past Medical History:  Diagnosis Date   Carotid artery disease (HCC)    nonobstructive   Chronic anticoagulation    H/O atrial flutter    Hyperlipidemia    Hypertension    PAF (paroxysmal atrial fibrillation) (HCC)    Valvular heart disease     Past Surgical History:  Procedure Laterality Date   childbirth     x4     Current Outpatient Medications  Medication Sig Dispense Refill   aspirin-sod bicarb-citric acid (ALKA-SELTZER) 325 MG TBEF tablet Take 325 mg by mouth every 6 (six) hours as needed ('when I don't feel good').     B Complex Vitamins (VITAMIN B COMPLEX) TABS See admin instructions.     benazepril-hydrochlorthiazide (LOTENSIN HCT) 10-12.5 MG per tablet Take 0.5 tablets by mouth  daily.      metoprolol succinate (TOPROL-XL) 25 MG 24 hr tablet TAKE 1 TABLET BY MOUTH EACH DAY. 90 tablet 1   XARELTO 15 MG TABS tablet TAKE ONE TABLET BY MOUTH ONCE DAILY 90 tablet 1   CRESTOR 10 MG tablet Take 10 mg by mouth daily.  (Patient not taking: Reported on 05/18/2021)     Multiple Vitamins-Minerals (EYE VITAMINS) CAPS 2 tablets (Patient not taking: Reported on 05/18/2021)     No current facility-administered medications for this visit.    Allergies:   Rosuvastatin    ROS:  Please see the history of present illness.   Otherwise, review of systems are positive for none.   All other systems are reviewed and negative.    PHYSICAL EXAM: VS:  BP 122/78   Pulse (!) 59   Ht '5\' 1"'$  (1.549 m)   Wt 121 lb 3.2 oz (55 kg)   SpO2 96%   BMI 22.90 kg/m  , BMI Body mass index is 22.9 kg/m. GEN:  No distress NECK:  No jugular venous distention at 90 degrees, waveform within normal limits, carotid upstroke brisk and symmetric, no bruits, no thyromegaly LUNGS:  Clear to auscultation bilaterally BACK:  No CVA tenderness HEART:  S1 and S2 within normal limits, no S3, no S4, no clicks, no rubs, no murmurs, irregular ABD:  Positive bowel sounds normal in  frequency in pitch, no bruits, no rebound, no guarding, unable to assess midline mass or bruit with the patient seated. EXT:  2 plus pulses throughout, no edema, no cyanosis no clubbing   EKG:  EKG is  ordered today. The ekg ordered today demonstrates atrial fibrillation, rate 60, axis within normal limits, intervals within normal limits, premature ventricular contractions.  No acute ST-T wave changes.   Recent Labs: No results found for requested labs within last 365 days.    Lipid Panel    Component Value Date/Time   CHOL 151 04/08/2019 1426   TRIG 90 04/08/2019 1426   HDL 60 04/08/2019 1426   CHOLHDL 2.5 04/08/2019 1426   LDLCALC 74 04/08/2019 1426      Wt Readings from Last 3 Encounters:  01/03/22 121 lb 3.2 oz (55 kg)   05/18/21 122 lb 9.6 oz (55.6 kg)  03/30/20 129 lb 12.8 oz (58.9 kg)      Other studies Reviewed: Additional studies/ records that were reviewed today include: None Review of the above records demonstrates:     ASSESSMENT AND PLAN:  Persistent atrial fibrillation:  Ms. Mary Knapp has a CHA2DS2 - VASc score of 4.   She tolerates anticoagulation and has had good rate control.  No change in therapy.  Hypertension: The blood pressure is at target.  No change in therapy.   Carotid stenosis:     She had 40 - 59% right stenosis in April 2023.  We will follow this with Doppler in one year.  This will be in April.  Hyperlipidemia:   LDL was 112 with an HDL 56.  I plan on not further adjusting her meds  Leg weakness: I will check ABIs and arterial Dopp no I suspect this is related to some back pain some arthritis that she has been told she has in her back.   Current medicines are reviewed at length with the patient today.  The patient does not have concerns regarding medicines.  The following changes have been made:  None  Labs/ tests ordered today include:  None  Orders Placed This Encounter  Procedures   VAS [place] ABI WITH/WO TBI   VAS [place] LOWER EXTREMITY ARTERIAL DUPLEX     Disposition:   FU with me in 12 months.     Signed, Minus Breeding, MD  01/03/2022 5:28 PM    [facility]

## 2022-01-03 ENCOUNTER — Ambulatory Visit: Payer: Medicare Other | Attending: Cardiology | Admitting: Cardiology

## 2022-01-03 ENCOUNTER — Encounter: Payer: Self-pay | Admitting: Cardiology

## 2022-01-03 VITALS — BP 122/78 | HR 59 | Ht 61.0 in | Wt 121.2 lb

## 2022-01-03 DIAGNOSIS — I739 Peripheral vascular disease, unspecified: Secondary | ICD-10-CM | POA: Diagnosis not present

## 2022-01-03 DIAGNOSIS — E785 Hyperlipidemia, unspecified: Secondary | ICD-10-CM | POA: Diagnosis not present

## 2022-01-03 DIAGNOSIS — I4819 Other persistent atrial fibrillation: Secondary | ICD-10-CM

## 2022-01-03 DIAGNOSIS — I6523 Occlusion and stenosis of bilateral carotid arteries: Secondary | ICD-10-CM

## 2022-01-03 DIAGNOSIS — I10 Essential (primary) hypertension: Secondary | ICD-10-CM

## 2022-01-03 NOTE — Patient Instructions (Signed)
Medication Instructions:  Your physician recommends that you continue on your current medications as directed. Please refer to the Current Medication list given to you today.  *If you need a refill on your cardiac medications before your next appointment, please call your pharmacy*   Lab Work: NONE If you have labs (blood work) drawn today and your tests are completely normal, you will receive your results only by: [place] (if you have MyChart) OR A paper copy in the mail If you have any lab test that is abnormal or we need to change your treatment, we will call you to review the results.   Testing/Procedures: Your physician has requested that you have an ankle brachial index (ABI). During this test an ultrasound and blood pressure cuff are used to evaluate the arteries that supply the legs with blood. Allow thirty minutes for this exam. There are no restrictions or special instructions.  Your physician has requested that you have a lower  extremity arterial duplex. This test is an ultrasound of the arteries in the legs. It looks at arterial blood flow in the legs. Allow one hour for Lower and Arterial scans. There are no restrictions or special instructions     Follow-Up: At [facility], you and your health needs are our priority.  As part of our continuing mission to provide you with exceptional heart care, we have created designated Provider Care Teams.  These Care Teams include your primary Cardiologist (physician) and Advanced Practice Providers (APPs -  Physician Assistants and Nurse Practitioners) who all work together to provide you with the care you need, when you need it.  We recommend signing up for the patient portal called "MyChart".  Sign up information is provided on this After Visit Summary.  MyChart is used to connect with patients for Virtual Visits (Telemedicine).  Patients are able to view lab/test results, encounter notes, upcoming appointments, etc.   Non-urgent messages can be sent to your provider as well.   To learn more about what you can do with MyChart, go to [email or web address].    Your next appointment:   1 year(s)  The format for your next appointment:   In Person  Provider:   Minus Breeding, MD

## 2022-01-04 NOTE — Addendum Note (Signed)
Addended by: Deanna Artis A on: 01/04/2022 04:40 PM   Modules accepted: Orders

## 2022-01-14 ENCOUNTER — Ambulatory Visit (HOSPITAL_COMMUNITY)
Admission: RE | Admit: 2022-01-14 | Discharge: 2022-01-14 | Disposition: A | Discharge status: Home / Self Care | Payer: Medicare Other | Source: Ambulatory Visit | Attending: Cardiology | Admitting: Cardiology

## 2022-01-14 DIAGNOSIS — I739 Peripheral vascular disease, unspecified: Secondary | ICD-10-CM | POA: Diagnosis not present

## 2022-01-16 ENCOUNTER — Telehealth: Payer: Self-pay | Admitting: Cardiology

## 2022-01-16 ENCOUNTER — Encounter: Payer: Self-pay | Admitting: *Deleted

## 2022-01-16 NOTE — Telephone Encounter (Signed)
Follow Up:    Daughter is calling for the ultrasound results from 01-14-22 please.

## 2022-01-16 NOTE — Telephone Encounter (Signed)
Results pending provider review

## 2022-01-18 NOTE — Telephone Encounter (Addendum)
Spoke with patient and gave her the ABI results and of referral to Dr. Fletcher Anon. Patient gave permission to speak to her daughter [name] at [phone]. ABI report faxed to Dr. Lavone Orn.

## 2022-01-18 NOTE — Telephone Encounter (Signed)
Per dr hochrein      Abnormal ABIs.  I would suggest referral to Dr. Fletcher Anon.   Unable to reach pt or leave a message

## 2022-01-18 NOTE — Telephone Encounter (Signed)
Pt is returning call.  

## 2022-01-22 NOTE — Telephone Encounter (Signed)
Left a message for the patient to call back to set up consult appointment with Dr. Fletcher Anon, per Dr Percival Spanish.

## 2022-01-22 NOTE — Telephone Encounter (Signed)
Appointment made for 1/9 with Dr. Fletcher Anon

## 2022-02-05 ENCOUNTER — Encounter: Payer: Self-pay | Admitting: Cardiovascular Disease

## 2022-02-05 ENCOUNTER — Ambulatory Visit: Payer: Medicare Other | Attending: Cardiovascular Disease | Admitting: Cardiovascular Disease

## 2022-02-05 VITALS — BP 136/84 | HR 63 | Ht 61.0 in | Wt 120.0 lb

## 2022-02-05 DIAGNOSIS — I779 Disorder of arteries and arterioles, unspecified: Secondary | ICD-10-CM | POA: Diagnosis not present

## 2022-02-05 DIAGNOSIS — I739 Peripheral vascular disease, unspecified: Secondary | ICD-10-CM

## 2022-02-05 DIAGNOSIS — I10 Essential (primary) hypertension: Secondary | ICD-10-CM

## 2022-02-05 DIAGNOSIS — I482 Chronic atrial fibrillation, unspecified: Secondary | ICD-10-CM | POA: Diagnosis not present

## 2022-02-05 NOTE — Patient Instructions (Signed)
Medication Instructions:  No changes *If you need a refill on your cardiac medications before your next appointment, please call your pharmacy*   Lab Work: None ordered If you have labs (blood work) drawn today and your tests are completely normal, you will receive your results only by: MyChart Message (if you have MyChart) OR A paper copy in the mail If you have any lab test that is abnormal or we need to change your treatment, we will call you to review the results.   Testing/Procedures: None ordered   Follow-Up: At [facility], you and your health needs are our priority.  As part of our continuing mission to provide you with exceptional heart care, we have created designated Provider Care Teams.  These Care Teams include your primary Cardiologist (physician) and Advanced Practice Providers (APPs -  Physician Assistants and Nurse Practitioners) who all work together to provide you with the care you need, when you need it.  We recommend signing up for the patient portal called "MyChart".  Sign up information is provided on this After Visit Summary.  MyChart is used to connect with patients for Virtual Visits (Telemedicine).  Patients are able to view lab/test results, encounter notes, upcoming appointments, etc.  Non-urgent messages can be sent to your provider as well.   To learn more about what you can do with MyChart, go to [email or web address].    Your next appointment:   Follow up as needed with Dr. Arida 

## 2022-02-05 NOTE — Progress Notes (Signed)
Cardiology Office Note   Date:  02/05/2022   ID:  Mary Knapp, DOB 1926/04/23, MRN [id number]  PCP:  Lavone Orn, MD  Cardiologist:  Dr. Percival Spanish  No chief complaint on file.     History of Present Illness: Mary Knapp is a 89 y.o. female who was referred by Dr. Percival Spanish for evaluation and management of peripheral arterial disease.  She has known history of paroxysmal atrial fibrillation, essential hypertension, hyperlipidemia and carotid disease.  She reports that she has been active throughout her life but she has been having significant difficulty with walking over the last 6 months.  She reports weakness in both legs with heaviness but no frank pain.  No calf discomfort.  No lower extremity ulceration.  Her mobility has declined significantly and she has been using a walker.  However, even with that, she has significant difficulties especially in her home environment where she lives.  She lives by herself in a multilevel house. She underwent noninvasive vascular evaluation last month which showed an ABI of 0.92 bilaterally.  Toe pressure was worse on the left side.  Duplex showed mainly below the knee disease with one-vessel runoff on the right side and two-vessel runoff on the left side with significant stenosis in the mid anterior tibial artery.    She was seen by [facility] but she does have significant lumbar spondylosis with degenerative scoliosis as well as element of spinal stenosis.  She was referred to physical therapy but it seems that she was not able to do much.  Past Medical History:  Diagnosis Date   Carotid artery disease (HCC)    nonobstructive   Chronic anticoagulation    H/O atrial flutter    Hyperlipidemia    Hypertension    PAF (paroxysmal atrial fibrillation) (HCC)    Valvular heart disease     Past Surgical History:  Procedure Laterality Date   childbirth     x4     Current Outpatient Medications  Medication Sig Dispense Refill    aspirin-sod bicarb-citric acid (ALKA-SELTZER) 325 MG TBEF tablet Take 325 mg by mouth every 6 (six) hours as needed ('when I don't feel good').     B Complex Vitamins (VITAMIN B COMPLEX) TABS See admin instructions.     benazepril-hydrochlorthiazide (LOTENSIN HCT) 10-12.5 MG per tablet Take 0.5 tablets by mouth daily.      CRESTOR 10 MG tablet Take 10 mg by mouth daily.     metoprolol succinate (TOPROL-XL) 25 MG 24 hr tablet TAKE 1 TABLET BY MOUTH EACH DAY. 90 tablet 1   Multiple Vitamins-Minerals (EYE VITAMINS) CAPS      XARELTO 15 MG TABS tablet TAKE ONE TABLET BY MOUTH ONCE DAILY 90 tablet 1   No current facility-administered medications for this visit.    Allergies:   Rosuvastatin    Social History:  The patient  reports that she has quit smoking. Her smoking use included cigarettes. She has never used smokeless tobacco. She reports that she does not drink alcohol and does not use drugs.   Family History:  The patient's family history includes Heart disease in her father.    ROS:  Please see the history of present illness.   Otherwise, review of systems are positive for none.   All other systems are reviewed and negative.    PHYSICAL EXAM: VS:  BP 136/84   Pulse 63   Ht '5\' 1"'$  (1.549 m)   Wt 120 lb (54.4 kg)   SpO2 98%  BMI 22.67 kg/m  , BMI Body mass index is 22.67 kg/m. GEN: Well nourished, well developed, in no acute distress  HEENT: normal  Neck: no JVD, carotid bruits, or masses Cardiac: Irregularly irregular; no  rubs, or gallops,no edema .  2 out of 6 systolic murmur in the aortic area Respiratory:  clear to auscultation bilaterally, normal work of breathing GI: soft, nontender, nondistended, + BS MS: no deformity or atrophy  Skin: warm and dry, no rash Neuro:  Strength and sensation are intact Psych: euthymic mood, full affect Vascular: Femoral pulses are slightly diminished.  Distal pulses are not palpable.   EKG:  EKG is not ordered today.    Recent  Labs: No results found for requested labs within last 365 days.    Lipid Panel    Component Value Date/Time   CHOL 151 04/08/2019 1426   TRIG 90 04/08/2019 1426   HDL 60 04/08/2019 1426   CHOLHDL 2.5 04/08/2019 1426   LDLCALC 74 04/08/2019 1426      Wt Readings from Last 3 Encounters:  02/05/22 120 lb (54.4 kg)  01/03/22 121 lb 3.2 oz (55 kg)  05/18/21 122 lb 9.6 oz (55.6 kg)           No data to display            ASSESSMENT AND PLAN:  1.  Peripheral arterial disease: The patient does have evidence of peripheral arterial disease but mainly affecting tibial/peroneal arteries.  Currently, she has no significant calf or foot claudication.  Most of her symptoms are manifested by leg weakness, gait instability and some heaviness feeling.  The symptoms are clearly out of proportion to her peripheral arterial disease and likely caused by another etiology.  Most likely related to her lumbar spine disease.  I do not think she benefits from revascularization.  Recommend continuing medical therapy.  2.  Atrial fibrillation: Ventricular rate is controlled on Toprol and she is on long-term anticoagulation with Xarelto.  3.  Essential hypertension: Blood pressure is controlled on current medications.  4.  Carotid disease: Most recent carotid Doppler in April 2023 showed moderate right carotid stenosis.  No need to repeat carotid Doppler considering her age and the lack of benefit from revascularization unless she is symptomatic.  5.  Hyperlipidemia currently on rosuvastatin 10 mg once daily.  6.  Lumbar spine disease: She has significant difficulty with mobility even with a walker.  Her home living situation is very concerning as she lives by herself in a multi level house.  I am concerned about home safety with her abnormal gait and she clearly needs assistance.  I also think she benefits from a wheelchair to help with mobility outside the house.  This was communicated with her  daughter Santiago Glad who is a Marine scientist.    Disposition:   FU with me as needed.  Signed,  Kathlyn Sacramento, MD  02/05/2022 5:48 PM    [place]

## 2022-02-08 ENCOUNTER — Telehealth: Payer: Self-pay | Admitting: Cardiology

## 2022-02-08 NOTE — Telephone Encounter (Signed)
Patient states she is having trouble walking due to PAD and she would like to speak with Dr. Rosezella [place] nurse to discuss. She is requesting a call back after 11:00 AM.

## 2022-02-11 NOTE — Telephone Encounter (Signed)
Attempted to call patient, left message for patient to call back to office.   

## 2022-02-11 NOTE — Telephone Encounter (Signed)
Patient returning call.

## 2022-02-11 NOTE — Telephone Encounter (Signed)
Returned call to patient who states that she had some questions for Dr. Percival Spanish but states that she does not remember them and will write them down and call back. Call back number given to patient.

## 2022-02-12 ENCOUNTER — Telehealth: Payer: Self-pay | Admitting: Cardiovascular Disease

## 2022-02-12 NOTE — Telephone Encounter (Signed)
New Message:      Patient's son called.He says patient needs a prescription from Dr Fletcher Anon for a wheelchair please. He said the wheelchair was discussed at her last office visit.

## 2022-02-12 NOTE — Telephone Encounter (Signed)
Son stated that at recent [place], Dr. Fletcher Anon suggested a W/C to keep mobility outside the house. Advised son that PCP needs to order w/c. He thanked me for the information.

## 2022-02-18 ENCOUNTER — Other Ambulatory Visit: Payer: Self-pay | Admitting: Cardiology

## 2022-02-18 ENCOUNTER — Other Ambulatory Visit: Payer: Self-pay | Admitting: Cardiovascular Disease

## 2022-02-18 DIAGNOSIS — I480 Paroxysmal atrial fibrillation: Secondary | ICD-10-CM

## 2022-02-18 NOTE — Telephone Encounter (Addendum)
Xarelto '15mg'$  refill request received. Pt is 89 years old, weight-54.4kg, Crea-0.90 on 02/17/22 via [facility] from PCP, last seen by Dr. Fletcher Anon on 02/05/22, Diagnosis-Afib, CrCl-33.48m/min; Dose is appropriate based on dosing criteria.

## 2022-05-31 DIAGNOSIS — M48061 Spinal stenosis, lumbar region without neurogenic claudication: Secondary | ICD-10-CM | POA: Diagnosis not present

## 2022-05-31 DIAGNOSIS — Z8673 Personal history of transient ischemic attack (TIA), and cerebral infarction without residual deficits: Secondary | ICD-10-CM | POA: Diagnosis not present

## 2022-05-31 DIAGNOSIS — N1831 Chronic kidney disease, stage 3a: Secondary | ICD-10-CM | POA: Diagnosis not present

## 2022-05-31 DIAGNOSIS — G4700 Insomnia, unspecified: Secondary | ICD-10-CM | POA: Diagnosis not present

## 2022-05-31 DIAGNOSIS — K219 Gastro-esophageal reflux disease without esophagitis: Secondary | ICD-10-CM | POA: Diagnosis not present

## 2022-05-31 DIAGNOSIS — H3530 Unspecified macular degeneration: Secondary | ICD-10-CM | POA: Diagnosis not present

## 2022-05-31 DIAGNOSIS — D631 Anemia in chronic kidney disease: Secondary | ICD-10-CM | POA: Diagnosis not present

## 2022-05-31 DIAGNOSIS — I129 Hypertensive chronic kidney disease with stage 1 through stage 4 chronic kidney disease, or unspecified chronic kidney disease: Secondary | ICD-10-CM | POA: Diagnosis not present

## 2022-05-31 DIAGNOSIS — I480 Paroxysmal atrial fibrillation: Secondary | ICD-10-CM | POA: Diagnosis not present

## 2022-05-31 DIAGNOSIS — H9190 Unspecified hearing loss, unspecified ear: Secondary | ICD-10-CM | POA: Diagnosis not present

## 2022-05-31 DIAGNOSIS — Z7901 Long term (current) use of anticoagulants: Secondary | ICD-10-CM | POA: Diagnosis not present

## 2022-05-31 DIAGNOSIS — I6523 Occlusion and stenosis of bilateral carotid arteries: Secondary | ICD-10-CM | POA: Diagnosis not present

## 2022-05-31 DIAGNOSIS — E785 Hyperlipidemia, unspecified: Secondary | ICD-10-CM | POA: Diagnosis not present

## 2022-05-31 DIAGNOSIS — M810 Age-related osteoporosis without current pathological fracture: Secondary | ICD-10-CM | POA: Diagnosis not present

## 2022-08-14 ENCOUNTER — Other Ambulatory Visit: Payer: Self-pay | Admitting: Cardiology

## 2022-08-14 DIAGNOSIS — I480 Paroxysmal atrial fibrillation: Secondary | ICD-10-CM

## 2022-08-14 NOTE — Telephone Encounter (Signed)
Prescription refill request for Xarelto received.  Indication: Afib  Last office visit: 02/05/22 Kirke Corin)  Weight: 54.4kg Age: 89 Scr: 0.90 (02/15/22 via [facility])  CrCl: 32.30ml/min  Appropriate dose. Refill sent.

## 2022-08-15 DIAGNOSIS — M48061 Spinal stenosis, lumbar region without neurogenic claudication: Secondary | ICD-10-CM | POA: Diagnosis not present

## 2022-08-15 DIAGNOSIS — I10 Essential (primary) hypertension: Secondary | ICD-10-CM | POA: Diagnosis not present

## 2022-08-15 DIAGNOSIS — R600 Localized edema: Secondary | ICD-10-CM | POA: Diagnosis not present

## 2022-08-15 DIAGNOSIS — R269 Unspecified abnormalities of gait and mobility: Secondary | ICD-10-CM | POA: Diagnosis not present

## 2022-08-15 DIAGNOSIS — I739 Peripheral vascular disease, unspecified: Secondary | ICD-10-CM | POA: Diagnosis not present

## 2022-09-03 DIAGNOSIS — M810 Age-related osteoporosis without current pathological fracture: Secondary | ICD-10-CM | POA: Diagnosis not present

## 2022-10-21 DIAGNOSIS — Z7189 Other specified counseling: Secondary | ICD-10-CM | POA: Diagnosis not present

## 2022-10-21 DIAGNOSIS — Z7185 Encounter for immunization safety counseling: Secondary | ICD-10-CM | POA: Diagnosis not present

## 2022-11-14 DIAGNOSIS — I10 Essential (primary) hypertension: Secondary | ICD-10-CM | POA: Diagnosis not present

## 2022-11-14 DIAGNOSIS — Z23 Encounter for immunization: Secondary | ICD-10-CM | POA: Diagnosis not present

## 2022-11-14 DIAGNOSIS — L219 Seborrheic dermatitis, unspecified: Secondary | ICD-10-CM | POA: Diagnosis not present

## 2022-11-14 DIAGNOSIS — R600 Localized edema: Secondary | ICD-10-CM | POA: Diagnosis not present

## 2022-11-14 DIAGNOSIS — R269 Unspecified abnormalities of gait and mobility: Secondary | ICD-10-CM | POA: Diagnosis not present

## 2022-11-14 DIAGNOSIS — M48061 Spinal stenosis, lumbar region without neurogenic claudication: Secondary | ICD-10-CM | POA: Diagnosis not present

## 2023-01-04 ENCOUNTER — Other Ambulatory Visit: Payer: Self-pay | Admitting: Cardiology

## 2023-02-03 ENCOUNTER — Other Ambulatory Visit: Payer: Self-pay | Admitting: Cardiovascular Disease

## 2023-02-03 DIAGNOSIS — I480 Paroxysmal atrial fibrillation: Secondary | ICD-10-CM

## 2023-02-03 NOTE — Telephone Encounter (Signed)
 Prescription refill request for Xarelto received.  Indication:afib Last office visit:needs appt Weight:54.4  kg Age:89 KGM:WNUUV labs CrCl:needs labs  Prescription refilled

## 2023-02-05 ENCOUNTER — Inpatient Hospital Stay (HOSPITAL_COMMUNITY)
Admission: EM | Admit: 2023-02-05 | Discharge: 2023-03-01 | DRG: 690 | Disposition: E | Payer: Medicare Other | Attending: Student in an Organized Health Care Education/Training Program | Admitting: Student in an Organized Health Care Education/Training Program

## 2023-02-05 ENCOUNTER — Emergency Department (HOSPITAL_COMMUNITY): Payer: Medicare Other

## 2023-02-05 ENCOUNTER — Encounter (HOSPITAL_COMMUNITY): Payer: Self-pay

## 2023-02-05 DIAGNOSIS — R0902 Hypoxemia: Secondary | ICD-10-CM | POA: Diagnosis present

## 2023-02-05 DIAGNOSIS — D649 Anemia, unspecified: Secondary | ICD-10-CM | POA: Diagnosis present

## 2023-02-05 DIAGNOSIS — R296 Repeated falls: Secondary | ICD-10-CM | POA: Diagnosis not present

## 2023-02-05 DIAGNOSIS — M47816 Spondylosis without myelopathy or radiculopathy, lumbar region: Secondary | ICD-10-CM | POA: Diagnosis not present

## 2023-02-05 DIAGNOSIS — I480 Paroxysmal atrial fibrillation: Secondary | ICD-10-CM | POA: Diagnosis not present

## 2023-02-05 DIAGNOSIS — N3000 Acute cystitis without hematuria: Secondary | ICD-10-CM | POA: Diagnosis not present

## 2023-02-05 DIAGNOSIS — I110 Hypertensive heart disease with heart failure: Secondary | ICD-10-CM | POA: Diagnosis not present

## 2023-02-05 DIAGNOSIS — I071 Rheumatic tricuspid insufficiency: Secondary | ICD-10-CM

## 2023-02-05 DIAGNOSIS — K761 Chronic passive congestion of liver: Secondary | ICD-10-CM | POA: Diagnosis present

## 2023-02-05 DIAGNOSIS — I081 Rheumatic disorders of both mitral and tricuspid valves: Secondary | ICD-10-CM | POA: Diagnosis present

## 2023-02-05 DIAGNOSIS — I739 Peripheral vascular disease, unspecified: Secondary | ICD-10-CM | POA: Diagnosis present

## 2023-02-05 DIAGNOSIS — Z7189 Other specified counseling: Secondary | ICD-10-CM

## 2023-02-05 DIAGNOSIS — M4316 Spondylolisthesis, lumbar region: Secondary | ICD-10-CM | POA: Diagnosis not present

## 2023-02-05 DIAGNOSIS — I482 Chronic atrial fibrillation, unspecified: Secondary | ICD-10-CM | POA: Diagnosis not present

## 2023-02-05 DIAGNOSIS — R52 Pain, unspecified: Secondary | ICD-10-CM

## 2023-02-05 DIAGNOSIS — E860 Dehydration: Secondary | ICD-10-CM | POA: Diagnosis not present

## 2023-02-05 DIAGNOSIS — Z87891 Personal history of nicotine dependence: Secondary | ICD-10-CM | POA: Diagnosis not present

## 2023-02-05 DIAGNOSIS — Z888 Allergy status to other drugs, medicaments and biological substances status: Secondary | ICD-10-CM

## 2023-02-05 DIAGNOSIS — E785 Hyperlipidemia, unspecified: Secondary | ICD-10-CM | POA: Diagnosis present

## 2023-02-05 DIAGNOSIS — R2689 Other abnormalities of gait and mobility: Secondary | ICD-10-CM | POA: Diagnosis not present

## 2023-02-05 DIAGNOSIS — Z79899 Other long term (current) drug therapy: Secondary | ICD-10-CM

## 2023-02-05 DIAGNOSIS — R531 Weakness: Secondary | ICD-10-CM | POA: Diagnosis not present

## 2023-02-05 DIAGNOSIS — J984 Other disorders of lung: Secondary | ICD-10-CM | POA: Diagnosis not present

## 2023-02-05 DIAGNOSIS — R269 Unspecified abnormalities of gait and mobility: Secondary | ICD-10-CM | POA: Diagnosis not present

## 2023-02-05 DIAGNOSIS — N390 Urinary tract infection, site not specified: Secondary | ICD-10-CM | POA: Diagnosis not present

## 2023-02-05 DIAGNOSIS — M8700 Idiopathic aseptic necrosis of unspecified bone: Secondary | ICD-10-CM

## 2023-02-05 DIAGNOSIS — Z515 Encounter for palliative care: Secondary | ICD-10-CM | POA: Diagnosis not present

## 2023-02-05 DIAGNOSIS — M48061 Spinal stenosis, lumbar region without neurogenic claudication: Secondary | ICD-10-CM | POA: Diagnosis not present

## 2023-02-05 DIAGNOSIS — M1612 Unilateral primary osteoarthritis, left hip: Secondary | ICD-10-CM | POA: Diagnosis not present

## 2023-02-05 DIAGNOSIS — I6782 Cerebral ischemia: Secondary | ICD-10-CM | POA: Diagnosis not present

## 2023-02-05 DIAGNOSIS — Y92009 Unspecified place in unspecified non-institutional (private) residence as the place of occurrence of the external cause: Secondary | ICD-10-CM | POA: Diagnosis not present

## 2023-02-05 DIAGNOSIS — B9620 Unspecified Escherichia coli [E. coli] as the cause of diseases classified elsewhere: Secondary | ICD-10-CM | POA: Diagnosis present

## 2023-02-05 DIAGNOSIS — W19XXXA Unspecified fall, initial encounter: Secondary | ICD-10-CM | POA: Diagnosis not present

## 2023-02-05 DIAGNOSIS — I700 Atherosclerosis of aorta: Secondary | ICD-10-CM | POA: Diagnosis not present

## 2023-02-05 DIAGNOSIS — E871 Hypo-osmolality and hyponatremia: Secondary | ICD-10-CM | POA: Diagnosis present

## 2023-02-05 DIAGNOSIS — W07XXXA Fall from chair, initial encounter: Secondary | ICD-10-CM | POA: Diagnosis present

## 2023-02-05 DIAGNOSIS — H9190 Unspecified hearing loss, unspecified ear: Secondary | ICD-10-CM | POA: Diagnosis not present

## 2023-02-05 DIAGNOSIS — Z66 Do not resuscitate: Secondary | ICD-10-CM | POA: Diagnosis present

## 2023-02-05 DIAGNOSIS — M879 Osteonecrosis, unspecified: Secondary | ICD-10-CM | POA: Diagnosis not present

## 2023-02-05 DIAGNOSIS — M4150 Other secondary scoliosis, site unspecified: Secondary | ICD-10-CM | POA: Diagnosis present

## 2023-02-05 DIAGNOSIS — Z043 Encounter for examination and observation following other accident: Secondary | ICD-10-CM | POA: Diagnosis not present

## 2023-02-05 DIAGNOSIS — I2510 Atherosclerotic heart disease of native coronary artery without angina pectoris: Secondary | ICD-10-CM | POA: Diagnosis not present

## 2023-02-05 DIAGNOSIS — I779 Disorder of arteries and arterioles, unspecified: Secondary | ICD-10-CM | POA: Diagnosis not present

## 2023-02-05 DIAGNOSIS — R600 Localized edema: Secondary | ICD-10-CM | POA: Diagnosis not present

## 2023-02-05 DIAGNOSIS — Z8249 Family history of ischemic heart disease and other diseases of the circulatory system: Secondary | ICD-10-CM | POA: Diagnosis not present

## 2023-02-05 DIAGNOSIS — Z751 Person awaiting admission to adequate facility elsewhere: Secondary | ICD-10-CM

## 2023-02-05 DIAGNOSIS — I10 Essential (primary) hypertension: Secondary | ICD-10-CM | POA: Diagnosis not present

## 2023-02-05 DIAGNOSIS — Z7901 Long term (current) use of anticoagulants: Secondary | ICD-10-CM | POA: Diagnosis not present

## 2023-02-05 DIAGNOSIS — I509 Heart failure, unspecified: Secondary | ICD-10-CM | POA: Diagnosis present

## 2023-02-05 DIAGNOSIS — M1610 Unilateral primary osteoarthritis, unspecified hip: Secondary | ICD-10-CM | POA: Diagnosis not present

## 2023-02-05 DIAGNOSIS — M87052 Idiopathic aseptic necrosis of left femur: Secondary | ICD-10-CM | POA: Insufficient documentation

## 2023-02-05 DIAGNOSIS — G629 Polyneuropathy, unspecified: Secondary | ICD-10-CM | POA: Diagnosis present

## 2023-02-05 DIAGNOSIS — I4891 Unspecified atrial fibrillation: Secondary | ICD-10-CM | POA: Diagnosis not present

## 2023-02-05 DIAGNOSIS — I517 Cardiomegaly: Secondary | ICD-10-CM | POA: Diagnosis not present

## 2023-02-05 DIAGNOSIS — S0990XA Unspecified injury of head, initial encounter: Secondary | ICD-10-CM | POA: Diagnosis not present

## 2023-02-05 DIAGNOSIS — G8929 Other chronic pain: Secondary | ICD-10-CM | POA: Diagnosis present

## 2023-02-05 DIAGNOSIS — Z743 Need for continuous supervision: Secondary | ICD-10-CM | POA: Diagnosis not present

## 2023-02-05 DIAGNOSIS — R918 Other nonspecific abnormal finding of lung field: Secondary | ICD-10-CM | POA: Diagnosis not present

## 2023-02-05 LAB — BASIC METABOLIC PANEL
Anion gap: 10 (ref 5–15)
BUN: 27 mg/dL — ABNORMAL HIGH (ref 8–23)
CO2: 26 mmol/L (ref 22–32)
Calcium: 9.7 mg/dL (ref 8.9–10.3)
Chloride: 96 mmol/L — ABNORMAL LOW (ref 98–111)
Creatinine, Ser: 0.68 mg/dL (ref 0.44–1.00)
GFR, Estimated: >60 mL/min (ref >60)
Glucose, Bld: 113 mg/dL — ABNORMAL HIGH (ref 70–99)
Potassium: 4.2 mmol/L (ref 3.5–5.1)
Sodium: 132 mmol/L — ABNORMAL LOW (ref 135–145)

## 2023-02-05 LAB — URINALYSIS, ROUTINE W REFLEX MICROSCOPIC
Bilirubin Urine: NEGATIVE
Glucose, UA: NEGATIVE mg/dL
Hgb urine dipstick: NEGATIVE
Ketones, ur: NEGATIVE mg/dL
Nitrite: POSITIVE — AB
Protein, ur: 30 mg/dL — AB
Specific Gravity, Urine: 1.02 (ref 1.005–1.030)
WBC, UA: >50 WBC/hpf (ref 0–5)
pH: 6 (ref 5.0–8.0)

## 2023-02-05 LAB — CBC
HCT: 38.7 % (ref 36.0–46.0)
Hemoglobin: 12.8 g/dL (ref 12.0–15.0)
MCH: 32.5 pg (ref 26.0–34.0)
MCHC: 33.1 g/dL (ref 30.0–36.0)
MCV: 98.2 fL (ref 80.0–100.0)
Platelets: 173 10*3/uL (ref 150–400)
RBC: 3.94 MIL/uL (ref 3.87–5.11)
RDW: 13.6 % (ref 11.5–15.5)
WBC: 9.9 10*3/uL (ref 4.0–10.5)
nRBC: 0 % (ref 0.0–0.2)

## 2023-02-05 LAB — BRAIN NATRIURETIC PEPTIDE: B Natriuretic Peptide: 203.6 pg/mL — ABNORMAL HIGH (ref 0.0–100.0)

## 2023-02-05 LAB — HEPATIC FUNCTION PANEL
ALT: 26 U/L (ref 0–44)
AST: 46 U/L — ABNORMAL HIGH (ref 15–41)
Albumin: 3.4 g/dL — ABNORMAL LOW (ref 3.5–5.0)
Alkaline Phosphatase: 67 U/L (ref 38–126)
Bilirubin, Direct: 0.3 mg/dL — ABNORMAL HIGH (ref 0.0–0.2)
Indirect Bilirubin: 1 mg/dL — ABNORMAL HIGH (ref 0.3–0.9)
Total Bilirubin: 1.3 mg/dL — ABNORMAL HIGH (ref 0.0–1.2)
Total Protein: 7 g/dL (ref 6.5–8.1)

## 2023-02-05 LAB — CK: Total CK: 515 U/L — ABNORMAL HIGH (ref 38–234)

## 2023-02-05 LAB — CBG MONITORING, ED: Glucose-Capillary: 96 mg/dL (ref 70–99)

## 2023-02-05 MED ORDER — LACTATED RINGERS IV BOLUS
1000.0000 mL | Freq: Once | INTRAVENOUS | Status: AC
  Administered 2023-02-06: 1000 mL via INTRAVENOUS

## 2023-02-05 MED ORDER — ACETAMINOPHEN 500 MG PO TABS
1000.0000 mg | ORAL_TABLET | Freq: Once | ORAL | Status: AC
  Administered 2023-02-05: 1000 mg via ORAL
  Filled 2023-02-05: qty 2

## 2023-02-05 MED ORDER — SODIUM CHLORIDE 0.9 % IV SOLN
1.0000 g | Freq: Once | INTRAVENOUS | Status: AC
  Administered 2023-02-05: 1 g via INTRAVENOUS
  Filled 2023-02-05: qty 10

## 2023-02-05 NOTE — ED Triage Notes (Signed)
 Pt arrives EMS from home. Pt accidentally slid out her chair at home today, was in the floor for about 6 hours. Same thing happened yesterday as well and was on the ground 8 hours after that. PT reports ongoing generalized weakness. Pt denies hitting her head, no loc, and she is AxOx4. Denies any injuries. Pt is on xarelto .

## 2023-02-05 NOTE — ED Notes (Signed)
 Patient transported to CT

## 2023-02-05 NOTE — ED Provider Notes (Signed)
 [place] EMERGENCY DEPARTMENT AT [facility] Provider Note   CSN: [id number] Arrival date & time: 02/05/23  1809     History  Chief Complaint  Patient presents with   Mary Knapp is a 89 y.o. female.  HPI Lives independently.  She has been having increasing weakness over the past few days with generalized weakness.  Patient does take Xarelto  for A-fib.  She reports that she just kind of slid out of her chair today.  She states she could not get back up and she ended up spending about 6 to 8 hours on the floor.  Family members were concerned because this also happened a day ago.  She reports that she is having pain in her hips legs and back.  She reports this has been an ongoing problem due to the severe arthritis.  She denies she think she sustained any injury.  She does not have a headache, neck pain or chest pain.    Home Medications Prior to Admission medications   Medication Sig Start Date End Date Taking? Authorizing Provider  ketoconazole  (NIZORAL ) 2 % shampoo Apply 1 Application topically 2 (two) times a week. 11/14/22  Yes [provider]  aspirin-sod bicarb-citric acid (ALKA-SELTZER) 325 MG TBEF tablet Take 325 mg by mouth every 6 (six) hours as needed ('when I don't feel good').    [provider]  B Complex Vitamins (VITAMIN B COMPLEX) TABS See admin instructions.    [provider]  benazepril -hydrochlorthiazide (LOTENSIN  HCT) 10-12.5 MG per tablet Take 0.5 tablets by mouth daily.  03/24/11   [provider]  CRESTOR  10 MG tablet Take 10 mg by mouth daily. 03/24/11   [provider]  metoprolol  succinate (TOPROL -XL) 25 MG 24 hr tablet TAKE ONE TABLET BY MOUTH EVERY DAY 01/07/23   Lavona Agent, MD  Multiple Vitamins-Minerals (EYE VITAMINS) CAPS     [provider]  Rivaroxaban  (XARELTO ) 15 MG TABS tablet Take 1 tablet (15 mg total) by mouth daily. Needs cardiology appt for Xarelto  Refills, call  office 02/03/23   Darron Deatrice LABOR, MD      Allergies    Rosuvastatin     Review of Systems   Review of Systems  Physical Exam Updated Vital Signs BP 130/84   Pulse 76   Temp 98.3 F (36.8 C) (Oral)   Resp 18   Ht 5' 1 (1.549 m)   Wt 56.7 kg   SpO2 98%   BMI 23.62 kg/m  Physical Exam Constitutional:      Comments: Patient is alert with clear mental status.  No confusion.  No respiratory distress.  Good physical condition for age.  HENT:     Head: Normocephalic and atraumatic.     Mouth/Throat:     Pharynx: Oropharynx is clear.  Eyes:     Extraocular Movements: Extraocular movements intact.  Cardiovascular:     Rate and Rhythm: Normal rate. Rhythm irregular.  Pulmonary:     Effort: Pulmonary effort is normal.     Breath sounds: Normal breath sounds.  Chest:     Chest wall: No tenderness.  Abdominal:     General: There is no distension.     Palpations: Abdomen is soft.     Tenderness: There is no abdominal tenderness. There is no guarding.  Musculoskeletal:     Comments: Patient has some edema bilateral lower extremities about 1-2+ symmetric.  No deformities.  Skin:    General: Skin is warm and  dry.  Neurological:     General: No focal deficit present.     Mental Status: She is oriented to person, place, and time.     Comments: Focal motor weakness.  Patient can use both upper extremities and can move both lower extremities but has more pain with movement of lower extremities that is limiting.  Psychiatric:        Mood and Affect: Mood normal.     ED Results / Procedures / Treatments   Labs (all labs ordered are listed, but only abnormal results are displayed) Labs Reviewed  BASIC METABOLIC PANEL - Abnormal; Notable for the following components:      Result Value   Sodium 132 (*)    Chloride 96 (*)    Glucose, Bld 113 (*)    BUN 27 (*)    All other components within normal limits  URINALYSIS, ROUTINE W REFLEX MICROSCOPIC - Abnormal; Notable for the  following components:   APPearance HAZY (*)    Protein, ur 30 (*)    Nitrite POSITIVE (*)    Leukocytes,Ua LARGE (*)    Bacteria, UA MANY (*)    All other components within normal limits  CK - Abnormal; Notable for the following components:   Total CK 515 (*)    All other components within normal limits  BRAIN NATRIURETIC PEPTIDE - Abnormal; Notable for the following components:   B Natriuretic Peptide 203.6 (*)    All other components within normal limits  HEPATIC FUNCTION PANEL - Abnormal; Notable for the following components:   Albumin 3.4 (*)    AST 46 (*)    Total Bilirubin 1.3 (*)    Bilirubin, Direct 0.3 (*)    Indirect Bilirubin 1.0 (*)    All other components within normal limits  URINE CULTURE  CBC  BASIC METABOLIC PANEL  CBC  CBG MONITORING, ED    EKG EKG Interpretation Date/Time:  Wednesday February 05 2023 18:37:09 EST Ventricular Rate:  76 PR Interval:    QRS Duration:  88 QT Interval:  390 QTC Calculation: 439 R Axis:   119  Text Interpretation: Atrial fibrillation Lateral infarct, old Probable anteroseptal infarct, old Artifact in lead(s) I II III aVR aVL aVF V1 V2 V3 and baseline wander in lead(s) V6 no old comparison Confirmed by Armenta Canning [phone]) on 02/06/2023 1:09:57 AM  Radiology CT Head Wo Contrast Result Date: 02/05/2023 CLINICAL DATA:  Head trauma fell out of chair EXAM: CT HEAD WITHOUT CONTRAST TECHNIQUE: Contiguous axial images were obtained from the base of the skull through the vertex without intravenous contrast. RADIATION DOSE REDUCTION: This exam was performed according to the departmental dose-optimization program which includes automated exposure control, adjustment of the mA and/or kV according to patient size and/or use of iterative reconstruction technique. COMPARISON:  None Available. FINDINGS: Brain: No acute territorial infarction, hemorrhage or intracranial mass. Moderate atrophy and chronic small vessel ischemic changes of the white  matter. Prominent ventricles felt secondary to atrophy. Vascular: No hyperdense vessels.  Carotid vascular calcification Skull: Normal. Negative for fracture or focal lesion. Sinuses/Orbits: No acute finding. Other: None IMPRESSION: 1. No CT evidence for acute intracranial abnormality. 2. Moderate atrophy and chronic small vessel ischemic changes of the white matter. Electronically Signed   By: Luke Bun M.D.   On: 02/05/2023 22:27   DG Chest Port 1 View Result Date: 02/05/2023 CLINICAL DATA:  Fall EXAM: PORTABLE CHEST 1 VIEW COMPARISON:  None Available. FINDINGS: Mild diffuse reticular interstitial opacity suspicious for  underlying chronic lung disease. Possible ground-glass infiltrate at left base. Upper normal cardiac silhouette with aortic atherosclerosis. No pneumothorax IMPRESSION: Mild diffuse reticular interstitial opacity suspicious for underlying chronic lung disease. Possible ground-glass infiltrate at left base. Electronically Signed   By: Luke Bun M.D.   On: 02/05/2023 21:21   DG Pelvis 1-2 Views Result Date: 02/05/2023 CLINICAL DATA:  Fall EXAM: PELVIS - 1-2 VIEW COMPARISON:  CT 05/02/2019 FINDINGS: SI joints are non widened. Pubic symphysis and rami appear intact. Interval severe arthritis of the left hip with bone on bone appearance and subarticular sclerosis. Sclerosis and lucency in the left femoral head with deformity, suspicious for AVN and partial collapse. IMPRESSION: 1. No acute osseous abnormality. 2. Interval severe arthritis of the left hip with findings suspicious for AVN and partial collapse of the left femoral head. Electronically Signed   By: Luke Bun M.D.   On: 02/05/2023 21:20   DG Lumbar Spine 2-3 Views Result Date: 02/05/2023 CLINICAL DATA:  Fall EXAM: LUMBAR SPINE - 2-3 VIEW COMPARISON:  None Available. FINDINGS: Trace anterolisthesis L2 on L3, L3 on L4 and L4 on L5. Vertebral body heights are maintained. Moderate severe diffuse degenerative changes with  multilevel disc space narrowing and osteophyte. Multiple level facet degenerative change. Aortic atherosclerosis IMPRESSION: Moderate to severe diffuse degenerative changes. Electronically Signed   By: Luke Bun M.D.   On: 02/05/2023 21:13    Procedures Procedures    Medications Ordered in ED Medications  acetaminophen  (TYLENOL ) tablet 500 mg (has no administration in time range)    Or  acetaminophen  (TYLENOL ) suppository 650 mg (has no administration in time range)  ondansetron  (ZOFRAN ) tablet 4 mg (has no administration in time range)    Or  ondansetron  (ZOFRAN ) injection 4 mg (has no administration in time range)  bisacodyl  (DULCOLAX) EC tablet 5 mg (has no administration in time range)  melatonin tablet 3 mg (has no administration in time range)  cefTRIAXone  (ROCEPHIN ) 1 g in sodium chloride  0.9 % 100 mL IVPB (has no administration in time range)  Rivaroxaban  (XARELTO ) tablet 15 mg (has no administration in time range)  ketoconazole  (NIZORAL ) 2 % shampoo 1 Application (has no administration in time range)  acetaminophen  (TYLENOL ) tablet 1,000 mg (1,000 mg Oral Given 02/05/23 2017)  lactated ringers  bolus 1,000 mL (1,000 mLs Intravenous Bolus 02/06/23 0016)  cefTRIAXone  (ROCEPHIN ) 1 g in sodium chloride  0.9 % 100 mL IVPB (0 g Intravenous Stopped 02/06/23 0017)    ED Course/ Medical Decision Making/ A&P                                 Medical Decision Making Amount and/or Complexity of Data Reviewed Labs: ordered. Radiology: ordered.  Risk OTC drugs. Decision regarding hospitalization.   Presents as outlined.  She is been functioning at home independently.  However she has taken several falls over the past couple of days.  Patient is chronically anticoagulated.  Will proceed with diagnostic evaluation to include lab work and imaging.  Currently mental status is excellent and no focal motor deficits to suggest CVA or cephalopathy.  Urinalysis positive nitrite large leuk  esterase greater than 50 WBC many bacteria white count 9.9 H&H 12 and 38 CK 515  CT head no acute findings structure I radiology portable chest pelvis and lumbar spine no acute abnormalities.  At this time with significant UTI and general weakness and 2 falls with prolonged stay on the  floor over the past 2 days, plan for admission.  At this time patient has very good mental status I suspect UTI is positive general weakness and increased falls at home.  Patient is anticoagulated CT shows no evidence of intracranial bleeding.  Consult: Dr. Claiborne for admission [facility] hospitalist        Final Clinical Impression(s) / ED Diagnoses Final diagnoses:  Acute cystitis without hematuria  Fall, initial encounter  Dehydration    Rx / DC Orders ED Discharge Orders     None         Armenta Canning, MD 02/06/23 [phone]

## 2023-02-06 ENCOUNTER — Other Ambulatory Visit: Payer: Self-pay

## 2023-02-06 ENCOUNTER — Inpatient Hospital Stay (HOSPITAL_COMMUNITY): Payer: Medicare Other

## 2023-02-06 DIAGNOSIS — R600 Localized edema: Secondary | ICD-10-CM

## 2023-02-06 DIAGNOSIS — I4891 Unspecified atrial fibrillation: Secondary | ICD-10-CM

## 2023-02-06 DIAGNOSIS — N3000 Acute cystitis without hematuria: Secondary | ICD-10-CM

## 2023-02-06 DIAGNOSIS — R053 Chronic cough: Secondary | ICD-10-CM | POA: Insufficient documentation

## 2023-02-06 DIAGNOSIS — Z7901 Long term (current) use of anticoagulants: Secondary | ICD-10-CM

## 2023-02-06 DIAGNOSIS — I480 Paroxysmal atrial fibrillation: Secondary | ICD-10-CM

## 2023-02-06 DIAGNOSIS — R269 Unspecified abnormalities of gait and mobility: Secondary | ICD-10-CM

## 2023-02-06 DIAGNOSIS — R296 Repeated falls: Secondary | ICD-10-CM | POA: Diagnosis present

## 2023-02-06 DIAGNOSIS — G4700 Insomnia, unspecified: Secondary | ICD-10-CM | POA: Insufficient documentation

## 2023-02-06 DIAGNOSIS — H9190 Unspecified hearing loss, unspecified ear: Secondary | ICD-10-CM

## 2023-02-06 LAB — ECHOCARDIOGRAM COMPLETE
Area-P 1/2: 3.48 cm2
Calc EF: 71 %
Height: 61 in
MV M vel: 4.91 m/s
MV Peak grad: 96.4 mm[Hg]
P 1/2 time: 484 ms
S' Lateral: 2.2 cm
Single Plane A2C EF: 71.3 %
Single Plane A4C EF: 72.1 %
Weight: 2000 [oz_av]

## 2023-02-06 LAB — CBC
HCT: 32.5 % — ABNORMAL LOW (ref 36.0–46.0)
Hemoglobin: 10.6 g/dL — ABNORMAL LOW (ref 12.0–15.0)
MCH: 32.3 pg (ref 26.0–34.0)
MCHC: 32.6 g/dL (ref 30.0–36.0)
MCV: 99.1 fL (ref 80.0–100.0)
Platelets: 155 10*3/uL (ref 150–400)
RBC: 3.28 MIL/uL — ABNORMAL LOW (ref 3.87–5.11)
RDW: 13.5 % (ref 11.5–15.5)
WBC: 8.7 10*3/uL (ref 4.0–10.5)
nRBC: 0 % (ref 0.0–0.2)

## 2023-02-06 LAB — BASIC METABOLIC PANEL
Anion gap: 11 (ref 5–15)
BUN: 26 mg/dL — ABNORMAL HIGH (ref 8–23)
CO2: 24 mmol/L (ref 22–32)
Calcium: 9.1 mg/dL (ref 8.9–10.3)
Chloride: 97 mmol/L — ABNORMAL LOW (ref 98–111)
Creatinine, Ser: 0.73 mg/dL (ref 0.44–1.00)
GFR, Estimated: >60 mL/min (ref >60)
Glucose, Bld: 116 mg/dL — ABNORMAL HIGH (ref 70–99)
Potassium: 3.8 mmol/L (ref 3.5–5.1)
Sodium: 132 mmol/L — ABNORMAL LOW (ref 135–145)

## 2023-02-06 MED ORDER — MELATONIN 3 MG PO TABS
3.0000 mg | ORAL_TABLET | Freq: Every evening | ORAL | Status: DC | PRN
  Administered 2023-02-07 – 2023-02-12 (×6): 3 mg via ORAL
  Filled 2023-02-06 (×6): qty 1

## 2023-02-06 MED ORDER — ACETAMINOPHEN 500 MG PO TABS
500.0000 mg | ORAL_TABLET | Freq: Four times a day (QID) | ORAL | Status: DC | PRN
  Administered 2023-02-06 – 2023-02-11 (×3): 500 mg via ORAL
  Filled 2023-02-06 (×2): qty 1

## 2023-02-06 MED ORDER — ACETAMINOPHEN 650 MG RE SUPP: 650.0000 mg | Freq: Four times a day (QID) | RECTAL | Status: DC | PRN

## 2023-02-06 MED ORDER — KETOCONAZOLE 2 % EX SHAM
1.0000 | MEDICATED_SHAMPOO | CUTANEOUS | Status: DC
  Administered 2023-02-13: 1 via TOPICAL
  Filled 2023-02-06 (×2): qty 120

## 2023-02-06 MED ORDER — SODIUM CHLORIDE 0.9 % IV SOLN
1.0000 g | INTRAVENOUS | Status: DC
  Administered 2023-02-06 – 2023-02-08 (×3): 1 g via INTRAVENOUS
  Filled 2023-02-06 (×3): qty 10

## 2023-02-06 MED ORDER — ONDANSETRON HCL 4 MG PO TABS: 4.0000 mg | ORAL_TABLET | Freq: Four times a day (QID) | ORAL | Status: DC | PRN

## 2023-02-06 MED ORDER — ENSURE ENLIVE PO LIQD
237.0000 mL | Freq: Two times a day (BID) | ORAL | Status: DC
  Administered 2023-02-06 – 2023-02-14 (×11): 237 mL via ORAL

## 2023-02-06 MED ORDER — TRAMADOL HCL 50 MG PO TABS
50.0000 mg | ORAL_TABLET | Freq: Once | ORAL | Status: AC
  Administered 2023-02-06: 50 mg via ORAL
  Filled 2023-02-06: qty 1

## 2023-02-06 MED ORDER — RIVAROXABAN 15 MG PO TABS
15.0000 mg | ORAL_TABLET | Freq: Every day | ORAL | Status: DC
  Administered 2023-02-06 – 2023-02-14 (×9): 15 mg via ORAL
  Filled 2023-02-06 (×9): qty 1

## 2023-02-06 MED ORDER — ONDANSETRON HCL 4 MG/2ML IJ SOLN: 4.0000 mg | Freq: Four times a day (QID) | INTRAMUSCULAR | Status: DC | PRN

## 2023-02-06 MED ORDER — BISACODYL 5 MG PO TBEC: 5.0000 mg | DELAYED_RELEASE_TABLET | Freq: Every day | ORAL | Status: DC | PRN

## 2023-02-06 NOTE — Progress Notes (Signed)
 Call to patient's son (kent) by this RN to check on Nydia's hearing aid. Makaley has her right hearing aid, but does not have her left one. Right hearing aid is in her ear. Left hearing aid was not in ED 16 after transfer to 1604 or with patient belongings on arrival to room. Pt's son thanked this RN for the call and will check at Ahtziry's home. Tonique stated,  they rushed me out so fast, I didn't have time to get it off the table This RN unable to establish w/ patient if this table is at home or in the ED.

## 2023-02-06 NOTE — ED Notes (Signed)
 Assisted pt to bedside commode.

## 2023-02-06 NOTE — H&P (Addendum)
 History and Physical    Patient: Mary Knapp FMW:[id number] DOB: 1926/02/27 DOA: 02/05/2023 DOS: the patient was seen and examined on 02/06/2023 PCP: Signa Rush, MD (Inactive)  Patient coming from: Home  Chief Complaint:  Chief Complaint  Patient presents with   Fall   HPI: Mary Knapp is a 89 y.o. female with medical history significant for atrial fibrillation on Xarelto , hypertension, hyperlipidemia, carotid artery disease, degenerative scoliosis, and peripheral arterial disease presents she says because of very painful lower extremities swelling.  She says the pain is keeping her up at night and it has been bad for about a week.  She has also had several falls recently.  She does take Xarelto  but she did not hit her head.  She says both times her legs just seem to give way.  She did not lose consciousness.  And her son was with her.  She has been having some difficulty walking but now with the swelling in her legs she says they feel like they weigh her 100 pounds each.  This is making things more difficult.  The patient lives alone at the age of 75.  She tells me today was her first time ever riding in an ambulance. Her evaluation in the emergency department included multiple x-rays a CT scan of her head.  The head CT did not reveal any significant trauma and there were no fractures noted on any of her x-rays.  X-ray of her pelvis however did show possible AVN. Incidentally a UTI was also discovered with positive nitrates and leukocyte esterase. The patient will be admitted to the hospitalist service for symptomatic UTI with multiple falls as well as workup and treatment  of this painful new lower extremity edema.   Review of Systems: As mentioned in the history of present illness. All other systems reviewed and are negative. Past Medical History:  Diagnosis Date   Carotid artery disease (HCC)    nonobstructive   Chronic anticoagulation    H/O atrial flutter    Hyperlipidemia     Hypertension    PAF (paroxysmal atrial fibrillation) (HCC)    Valvular heart disease    Past Surgical History:  Procedure Laterality Date   childbirth     x4   Social History:  reports that she has quit smoking. Her smoking use included cigarettes. She has never used smokeless tobacco. She reports that she does not drink alcohol and does not use drugs.  Allergies  Allergen Reactions   Rosuvastatin      Other reaction(s): myalgias    Family History  Problem Relation Age of Onset   Heart disease Father     Prior to Admission medications   Medication Sig Start Date End Date Taking? Authorizing Provider  ketoconazole  (NIZORAL ) 2 % shampoo Apply 1 Application topically 2 (two) times a week. 11/14/22  Yes [provider]  aspirin-sod bicarb-citric acid (ALKA-SELTZER) 325 MG TBEF tablet Take 325 mg by mouth every 6 (six) hours as needed ('when I don't feel good').    [provider]  B Complex Vitamins (VITAMIN B COMPLEX) TABS See admin instructions.    [provider]  benazepril -hydrochlorthiazide (LOTENSIN  HCT) 10-12.5 MG per tablet Take 0.5 tablets by mouth daily.  03/24/11   [provider]  CRESTOR  10 MG tablet Take 10 mg by mouth daily. 03/24/11   [provider]  metoprolol  succinate (TOPROL -XL) 25 MG 24 hr tablet TAKE ONE TABLET BY MOUTH EVERY DAY 01/07/23   Lavona Agent, MD  Multiple Vitamins-Minerals (EYE VITAMINS) CAPS     [provider]  Rivaroxaban  (XARELTO ) 15 MG TABS tablet Take 1 tablet (15 mg total) by mouth daily. Needs cardiology appt for Xarelto  Refills, call office 02/03/23   Darron Deatrice LABOR, MD    Physical Exam: Vitals:   02/05/23 1824 02/05/23 1945 02/05/23 2230 02/05/23 2315  BP: (!) 151/77 (!) 146/70 (!) 140/128 130/84  Pulse: 79 72 73 76  Resp: 17 19 18 18   Temp: (!) 97.5 F (36.4 C) 98.6 F (37 C)  98.3 F (36.8 C)  TempSrc:  Axillary  Oral  SpO2: 100% 100% 98% 98%  Weight: 56.7 kg     Height:  5' 1 (1.549 m)      Physical Exam:  General: Elderly woman in no acute distress HEENT: Normocephalic, atraumatic, PERRL Cardiovascular: Normal rate and rhythm. Distal pulses difficult to palpate because the patient is very tender when her feet or legs are touched. Pulmonary: Normal pulmonary effort, normal breath sounds. Gastrointestinal: Nondistended abdomen, soft, non-tender, normoactive bowel sounds Musculoskeletal: Kyphosis.  2- 3+ lower extremity edema Skin: Skin is warm and dry. Neuro: No focal deficits noted, AAOx3, hard of hearing while the patient is appropriate and oriented she did have some difficulties with short-term memory.  She was not able to remember why she was on Xarelto .  She was not able to remember who her healthcare power of attorney was though she was able to tell me that she had completed all of her directives. PSYCH: Attentive and cooperative  Data Reviewed:  Results for orders placed or performed during the hospital encounter of 02/05/23 (from the past 24 hours)  Basic metabolic panel     Status: Abnormal   Collection Time: 02/05/23  6:26 PM  Result Value Ref Range   Sodium 132 (L) 135 - 145 mmol/L   Potassium 4.2 3.5 - 5.1 mmol/L   Chloride 96 (L) 98 - 111 mmol/L   CO2 26 22 - 32 mmol/L   Glucose, Bld 113 (H) 70 - 99 mg/dL   BUN 27 (H) 8 - 23 mg/dL   Creatinine, Ser 9.31 0.44 - 1.00 mg/dL   Calcium  9.7 8.9 - 10.3 mg/dL   GFR, Estimated >39 >39 mL/min   Anion gap 10 5 - 15  CBC     Status: None   Collection Time: 02/05/23  6:26 PM  Result Value Ref Range   WBC 9.9 4.0 - 10.5 K/uL   RBC 3.94 3.87 - 5.11 MIL/uL   Hemoglobin 12.8 12.0 - 15.0 g/dL   HCT 61.2 63.9 - 53.9 %   MCV 98.2 80.0 - 100.0 fL   MCH 32.5 26.0 - 34.0 pg   MCHC 33.1 30.0 - 36.0 g/dL   RDW 86.3 88.4 - 84.4 %   Platelets 173 150 - 400 K/uL   nRBC 0.0 0.0 - 0.2 %  CBG monitoring, ED     Status: None   Collection Time: 02/05/23  6:26 PM  Result Value Ref Range   Glucose-Capillary  96 70 - 99 mg/dL  CK     Status: Abnormal   Collection Time: 02/05/23  6:26 PM  Result Value Ref Range   Total CK 515 (H) 38 - 234 U/L  Brain natriuretic peptide     Status: Abnormal   Collection Time: 02/05/23  6:26 PM  Result Value Ref Range   B Natriuretic Peptide 203.6 (H) 0.0 - 100.0 pg/mL  Hepatic function panel     Status: Abnormal  Collection Time: 02/05/23  6:26 PM  Result Value Ref Range   Total Protein 7.0 6.5 - 8.1 g/dL   Albumin 3.4 (L) 3.5 - 5.0 g/dL   AST 46 (H) 15 - 41 U/L   ALT 26 0 - 44 U/L   Alkaline Phosphatase 67 38 - 126 U/L   Total Bilirubin 1.3 (H) 0.0 - 1.2 mg/dL   Bilirubin, Direct 0.3 (H) 0.0 - 0.2 mg/dL   Indirect Bilirubin 1.0 (H) 0.3 - 0.9 mg/dL  Urinalysis, Routine w reflex microscopic -Urine, Clean Catch     Status: Abnormal   Collection Time: 02/05/23  9:00 PM  Result Value Ref Range   Color, Urine YELLOW YELLOW   APPearance HAZY (A) CLEAR   Specific Gravity, Urine 1.020 1.005 - 1.030   pH 6.0 5.0 - 8.0   Glucose, UA NEGATIVE NEGATIVE mg/dL   Hgb urine dipstick NEGATIVE NEGATIVE   Bilirubin Urine NEGATIVE NEGATIVE   Ketones, ur NEGATIVE NEGATIVE mg/dL   Protein, ur 30 (A) NEGATIVE mg/dL   Nitrite POSITIVE (A) NEGATIVE   Leukocytes,Ua LARGE (A) NEGATIVE   RBC / HPF 0-5 0 - 5 RBC/hpf   WBC, UA >50 0 - 5 WBC/hpf   Bacteria, UA MANY (A) NONE SEEN   Squamous Epithelial / HPF 0-5 0 - 5 /HPF   Mucus PRESENT      Assessment and Plan: UTI -IV Rocephin  Painful bilateral lower extremity edema -the patient says this is the reason she came to the hospital.  She does not take Lasix .  She says this swelling is fairly new for her but she is quite symptomatic.  Her BNP is not significantly elevated.  Will check an echocardiogram.  Right now her legs are too tender to put on compression stockings though she says these have been recommended for her.  I am hesitant to give Lasix  to a 89 year old with a UTI, but she may benefit from 1 dose. Gait instability  -this has been an ongoing problem.  I see cardiology recommended a wheelchair for her a year ago,  But her pelvic x-ray this evening revealed findings suspicious for AVN and partial collapse of the left femoral head.  Consider orthopedic consult for recommendations. 4.  Atrial fibrillation- Continue Xarelto  and beta-blocker. 5.  Given her kyphosis she should probably have an osteoporosis assessment if she has not already.   Advance Care Planning:   Code Status: Limited: Do not attempt resuscitation (DNR) -DNR-LIMITED -Do Not Intubate/DNI the patient was not able to tell me who her healthcare power of attorney is though she says she has done all the paperwork because her son-in-law is a doctor.  She eventually said I should call her oldest son first as her surrogate decision-maker if she was not able to make decisions.  She also says that she has a DNR paper on the front of her refrigerator at home.  Consults: None  Family Communication: None  Severity of Illness: The appropriate patient status for this patient is INPATIENT. Inpatient status is judged to be reasonable and necessary in order to provide the required intensity of service to ensure the patient's safety. The patient's presenting symptoms, physical exam findings, and initial radiographic and laboratory data in the context of their chronic comorbidities is felt to place them at high risk for further clinical deterioration. Furthermore, it is not anticipated that the patient will be medically stable for discharge from the hospital within 2 midnights of admission.   * I certify that at the  point of admission it is my clinical judgment that the patient will require inpatient hospital care spanning beyond 2 midnights from the point of admission due to high intensity of service, high risk for further deterioration and high frequency of surveillance required.*  Author: ARTHEA CHILD, MD 02/06/2023 12:33 AM  For on call review  [email or web address].

## 2023-02-06 NOTE — ED Notes (Signed)
 ED TO INPATIENT HANDOFF REPORT  ED Nurse Name and Phone #: Olen, RN  [phone]  S Name/Age/Gender Mary Knapp 89 y.o. female Room/Bed: WA16/WA16  Code Status   Code Status: Limited: Do not attempt resuscitation (DNR) -DNR-LIMITED -Do Not Intubate/DNI   Home/SNF/Other Home Patient oriented to: self, place, time, and situation Is this baseline? Yes   Triage Complete: Triage complete  Chief Complaint UTI (urinary tract infection) [N39.0]  Triage Note Pt arrives EMS from home. Pt accidentally slid out her chair at home today, was in the floor for about 6 hours. Same thing happened yesterday as well and was on the ground 8 hours after that. PT reports ongoing generalized weakness. Pt denies hitting her head, no loc, and she is AxOx4. Denies any injuries. Pt is on xarelto .    Allergies Allergies  Allergen Reactions   Rosuvastatin      Other reaction(s): myalgias    Level of Care/Admitting Diagnosis ED Disposition     ED Disposition  Admit   Condition  --   Comment  Hospital Area: [facility] [[id number]]  Level of Care: Med-Surg [16]  May admit patient to [facility] or [facility] if equivalent level of care is available:: Yes  Covid Evaluation: Asymptomatic - no recent exposure (last 10 days) testing not required  Diagnosis: UTI (urinary tract infection) [781136]  Admitting Physician: ARTHEA CHILD [[id number]]  Attending Physician: ARTHEA CHILD [[id number]]  Certification:: I certify this patient will need inpatient services for at least 2 midnights  Expected Medical Readiness: 02/07/2023          B Medical/Surgery History Past Medical History:  Diagnosis Date   Carotid artery disease (HCC)    nonobstructive   Chronic anticoagulation    H/O atrial flutter    Hyperlipidemia    Hypertension    PAF (paroxysmal atrial fibrillation) (HCC)    Valvular heart disease    Past Surgical History:  Procedure Laterality Date   childbirth     x4      A IV Location/Drains/Wounds Patient Lines/Drains/Airways Status     Active Line/Drains/Airways     Name Placement date Placement time Site Days   Peripheral IV 02/06/23 20 G 1 Anterior;Left;Upper Arm 02/06/23  0550  Arm  less than 1            Intake/Output Last 24 hours  Intake/Output Summary (Last 24 hours) at 02/06/2023 1158 Last data filed at 02/06/2023 0017 Gross per 24 hour  Intake 93.33 ml  Output --  Net 93.33 ml    Labs/Imaging Results for orders placed or performed during the hospital encounter of 02/05/23 (from the past 48 hours)  Basic metabolic panel     Status: Abnormal   Collection Time: 02/05/23  6:26 PM  Result Value Ref Range   Sodium 132 (L) 135 - 145 mmol/L   Potassium 4.2 3.5 - 5.1 mmol/L   Chloride 96 (L) 98 - 111 mmol/L   CO2 26 22 - 32 mmol/L   Glucose, Bld 113 (H) 70 - 99 mg/dL    Comment: Glucose reference range applies only to samples taken after fasting for at least 8 hours.   BUN 27 (H) 8 - 23 mg/dL   Creatinine, Ser 9.31 0.44 - 1.00 mg/dL   Calcium  9.7 8.9 - 10.3 mg/dL   GFR, Estimated >39 >39 mL/min    Comment: (NOTE) Calculated using the CKD-EPI Creatinine Equation (2021)    Anion gap 10 5 - 15    Comment:  Performed at [facility], [address] [place], [place] [address]  CBC     Status: None   Collection Time: 02/05/23  6:26 PM  Result Value Ref Range   WBC 9.9 4.0 - 10.5 K/uL   RBC 3.94 3.87 - 5.11 MIL/uL   Hemoglobin 12.8 12.0 - 15.0 g/dL   HCT 61.2 63.9 - 53.9 %   MCV 98.2 80.0 - 100.0 fL   MCH 32.5 26.0 - 34.0 pg   MCHC 33.1 30.0 - 36.0 g/dL   RDW 86.3 88.4 - 84.4 %   Platelets 173 150 - 400 K/uL   nRBC 0.0 0.0 - 0.2 %    Comment: Performed at [facility], [address], [place], [place] [address]  CBG monitoring, ED     Status: None   Collection Time: 02/05/23  6:26 PM  Result Value Ref Range   Glucose-Capillary 96 70 - 99 mg/dL    Comment: Glucose reference range applies  only to samples taken after fasting for at least 8 hours.  CK     Status: Abnormal   Collection Time: 02/05/23  6:26 PM  Result Value Ref Range   Total CK 515 (H) 38 - 234 U/L    Comment: Performed at [facility], [address] [place], [place] [address]  Brain natriuretic peptide     Status: Abnormal   Collection Time: 02/05/23  6:26 PM  Result Value Ref Range   B Natriuretic Peptide 203.6 (H) 0.0 - 100.0 pg/mL    Comment: Performed at [facility], [address] [place], [place] [address]  Hepatic function panel     Status: Abnormal   Collection Time: 02/05/23  6:26 PM  Result Value Ref Range   Total Protein 7.0 6.5 - 8.1 g/dL   Albumin 3.4 (L) 3.5 - 5.0 g/dL   AST 46 (H) 15 - 41 U/L   ALT 26 0 - 44 U/L   Alkaline Phosphatase 67 38 - 126 U/L   Total Bilirubin 1.3 (H) 0.0 - 1.2 mg/dL   Bilirubin, Direct 0.3 (H) 0.0 - 0.2 mg/dL   Indirect Bilirubin 1.0 (H) 0.3 - 0.9 mg/dL    Comment: Performed at [facility], [address], [place], [place] [address]  Urinalysis, Routine w reflex microscopic -Urine, Clean Catch     Status: Abnormal   Collection Time: 02/05/23  9:00 PM  Result Value Ref Range   Color, Urine YELLOW YELLOW   APPearance HAZY (A) CLEAR   Specific Gravity, Urine 1.020 1.005 - 1.030   pH 6.0 5.0 - 8.0   Glucose, UA NEGATIVE NEGATIVE mg/dL   Hgb urine dipstick NEGATIVE NEGATIVE   Bilirubin Urine NEGATIVE NEGATIVE   Ketones, ur NEGATIVE NEGATIVE mg/dL   Protein, ur 30 (A) NEGATIVE mg/dL   Nitrite POSITIVE (A) NEGATIVE   Leukocytes,Ua LARGE (A) NEGATIVE   RBC / HPF 0-5 0 - 5 RBC/hpf   WBC, UA >50 0 - 5 WBC/hpf   Bacteria, UA MANY (A) NONE SEEN   Squamous Epithelial / HPF 0-5 0 - 5 /HPF   Mucus PRESENT     Comment: Performed at [facility], [address], [place], [place] [address]  Basic metabolic panel     Status: Abnormal   Collection Time: 02/06/23  5:44 AM  Result Value Ref  Range   Sodium 132 (L) 135 - 145 mmol/L   Potassium 3.8 3.5 - 5.1 mmol/L  Chloride 97 (L) 98 - 111 mmol/L   CO2 24 22 - 32 mmol/L   Glucose, Bld 116 (H) 70 - 99 mg/dL    Comment: Glucose reference range applies only to samples taken after fasting for at least 8 hours.   BUN 26 (H) 8 - 23 mg/dL   Creatinine, Ser 9.26 0.44 - 1.00 mg/dL   Calcium  9.1 8.9 - 10.3 mg/dL   GFR, Estimated >39 >39 mL/min    Comment: (NOTE) Calculated using the CKD-EPI Creatinine Equation (2021)    Anion gap 11 5 - 15    Comment: Performed at [facility], [address] [place], [place] [address]  CBC     Status: Abnormal   Collection Time: 02/06/23  5:44 AM  Result Value Ref Range   WBC 8.7 4.0 - 10.5 K/uL   RBC 3.28 (L) 3.87 - 5.11 MIL/uL   Hemoglobin 10.6 (L) 12.0 - 15.0 g/dL   HCT 67.4 (L) 63.9 - 53.9 %   MCV 99.1 80.0 - 100.0 fL   MCH 32.3 26.0 - 34.0 pg   MCHC 32.6 30.0 - 36.0 g/dL   RDW 86.4 88.4 - 84.4 %   Platelets 155 150 - 400 K/uL   nRBC 0.0 0.0 - 0.2 %    Comment: Performed at [facility], [address] [place], [place] [address]   ECHOCARDIOGRAM COMPLETE Result Date: 02/06/2023    ECHOCARDIOGRAM REPORT   Patient Name:   Mary Knapp Date of Exam: 02/06/2023 Medical Rec #:  [id number]     Height:       61.0 in Accession #:    [id number]    Weight:       125.0 lb Date of Birth:  06/24/1926     BSA:          1.547 m Patient Age:    89 years      BP:           130/59 mmHg Patient Gender: F             HR:           68 bpm. Exam Location:  Inpatient Procedure: 2D Echo, Color Doppler and Cardiac Doppler Indications:    I48.91* Unspeicified atrial fibrillation  History:        Patient has no prior history of Echocardiogram examinations.                 CAD, Arrythmias:Atrial Fibrillation and Atrial Flutter; Risk                 Factors:Hypertension.  Sonographer:    Lanell Maduro Referring Phys: [id number] CLAUDIA CLAIBORNE IMPRESSIONS  1. Left ventricular ejection  fraction, by estimation, is 70 to 75%. Left ventricular ejection fraction by 2D MOD biplane is 71.0 %. The left ventricle has hyperdynamic function. The left ventricle has no regional wall motion abnormalities. There is moderate asymmetric left ventricular hypertrophy of the basal-septal segment. Left ventricular diastolic function could not be evaluated.  2. Right ventricular systolic function is low normal. The right ventricular size is normal. There is moderately elevated pulmonary artery systolic pressure. The estimated right ventricular systolic pressure is 59.4 mmHg.  3. Left atrial size was severely dilated.  4. Right atrial size was severely dilated.  5. The mitral valve is abnormal. Mild to moderate mitral valve regurgitation.  6. Leaflet thickening is noted. The tricuspid valve is abnormal. Tricuspid valve regurgitation is moderate to severe.  7.  The aortic valve is tricuspid. Aortic valve regurgitation is trivial. Aortic valve sclerosis/calcification is present, without any evidence of aortic stenosis. Aortic regurgitation PHT measures 484 msec.  8. The inferior vena cava is dilated in size with <50% respiratory variability, suggesting right atrial pressure of 15 mmHg. Comparison(s): No prior Echocardiogram. FINDINGS  Left Ventricle: Left ventricular ejection fraction, by estimation, is 70 to 75%. Left ventricular ejection fraction by 2D MOD biplane is 71.0 %. The left ventricle has hyperdynamic function. The left ventricle has no regional wall motion abnormalities. The left ventricular internal cavity size was normal in size. There is moderate asymmetric left ventricular hypertrophy of the basal-septal segment. Left ventricular diastolic function could not be evaluated due to atrial fibrillation. Left ventricular diastolic function could not be evaluated. Right Ventricle: The right ventricular size is normal. No increase in right ventricular wall thickness. Right ventricular systolic function is low  normal. There is moderately elevated pulmonary artery systolic pressure. The tricuspid regurgitant velocity  is 3.33 m/s, and with an assumed right atrial pressure of 15 mmHg, the estimated right ventricular systolic pressure is 59.4 mmHg. Left Atrium: Left atrial size was severely dilated. Right Atrium: Right atrial size was severely dilated. Pericardium: There is no evidence of pericardial effusion. Mitral Valve: The mitral valve is abnormal. There is mild thickening of the anterior and posterior mitral valve leaflet(s). There is mild calcification of the anterior and posterior mitral valve leaflet(s). Mild to moderate mitral valve regurgitation, with centrally-directed jet. Tricuspid Valve: Leaflet thickening is noted. The tricuspid valve is abnormal. Tricuspid valve regurgitation is moderate to severe. Aortic Valve: The aortic valve is tricuspid. Aortic valve regurgitation is trivial. Aortic regurgitation PHT measures 484 msec. Aortic valve sclerosis/calcification is present, without any evidence of aortic stenosis. Pulmonic Valve: The pulmonic valve was grossly normal. Pulmonic valve regurgitation is mild. Aorta: The aortic root and ascending aorta are structurally normal, with no evidence of dilitation. Venous: The inferior vena cava is dilated in size with less than 50% respiratory variability, suggesting right atrial pressure of 15 mmHg. IAS/Shunts: No atrial level shunt detected by color flow Doppler.  LEFT VENTRICLE PLAX 2D                        Biplane EF (MOD) LVIDd:         4.10 cm         LV Biplane EF:   Left LVIDs:         2.20 cm                          ventricular LV PW:         0.90 cm                          ejection LV IVS:        1.20 cm                          fraction by LVOT diam:     1.80 cm                          2D MOD LV SV:         65  biplane is LV SV Index:   42                               71.0 %. LVOT Area:     2.54 cm                                 Diastology                                LV e' medial:    7.07 cm/s LV Volumes (MOD)               LV E/e' medial:  21.4 LV vol d, MOD    34.4 ml       LV e' lateral:   12.30 cm/s A2C:                           LV E/e' lateral: 12.3 LV vol d, MOD    44.1 ml A4C: LV vol s, MOD    9.9 ml A2C: LV vol s, MOD    12.3 ml A4C: LV SV MOD A2C:   24.5 ml LV SV MOD A4C:   44.1 ml LV SV MOD BP:    28.3 ml RIGHT VENTRICLE             IVC RV Basal diam:  4.40 cm     IVC diam: 2.20 cm RV Mid diam:    2.50 cm RV S prime:     10.60 cm/s TAPSE (M-mode): 1.9 cm LEFT ATRIUM             Index        RIGHT ATRIUM           Index LA diam:        5.10 cm 3.30 cm/m   RA Area:     27.00 cm LA Vol (A2C):   75.4 ml 48.75 ml/m  RA Volume:   85.20 ml  55.08 ml/m LA Vol (A4C):   79.6 ml 51.46 ml/m LA Biplane Vol: 77.1 ml 49.85 ml/m  AORTIC VALVE             PULMONIC VALVE LVOT Vmax:   112.00 cm/s PR End Diast Vel: 1.40 msec LVOT Vmean:  79.600 cm/s LVOT VTI:    0.257 m AI PHT:      484 msec  AORTA Ao Root diam: 2.60 cm Ao Asc diam:  3.50 cm MITRAL VALVE                TRICUSPID VALVE MV Area (PHT): 3.48 cm     TR Peak grad:   44.4 mmHg MV Decel Time: 218 msec     TR Vmax:        333.00 cm/s MR Peak grad: 96.4 mmHg MR Mean grad: 66.0 mmHg     SHUNTS MR Vmax:      491.00 cm/s   Systemic VTI:  0.26 m MR Vmean:     381.0 cm/s    Systemic Diam: 1.80 cm MV E velocity: 151.00 cm/s Vinie Maxcy MD Electronically signed by Vinie Maxcy MD Signature Date/Time: 02/06/2023/11:09:26 AM    Final    CT Head Wo Contrast Result Date: 02/05/2023 CLINICAL DATA:  Head trauma fell out of chair EXAM: CT  HEAD WITHOUT CONTRAST TECHNIQUE: Contiguous axial images were obtained from the base of the skull through the vertex without intravenous contrast. RADIATION DOSE REDUCTION: This exam was performed according to the departmental dose-optimization program which includes automated exposure control, adjustment of the mA and/or kV according to patient size  and/or use of iterative reconstruction technique. COMPARISON:  None Available. FINDINGS: Brain: No acute territorial infarction, hemorrhage or intracranial mass. Moderate atrophy and chronic small vessel ischemic changes of the white matter. Prominent ventricles felt secondary to atrophy. Vascular: No hyperdense vessels.  Carotid vascular calcification Skull: Normal. Negative for fracture or focal lesion. Sinuses/Orbits: No acute finding. Other: None IMPRESSION: 1. No CT evidence for acute intracranial abnormality. 2. Moderate atrophy and chronic small vessel ischemic changes of the white matter. Electronically Signed   By: Luke Bun M.D.   On: 02/05/2023 22:27   DG Chest Port 1 View Result Date: 02/05/2023 CLINICAL DATA:  Fall EXAM: PORTABLE CHEST 1 VIEW COMPARISON:  None Available. FINDINGS: Mild diffuse reticular interstitial opacity suspicious for underlying chronic lung disease. Possible ground-glass infiltrate at left base. Upper normal cardiac silhouette with aortic atherosclerosis. No pneumothorax IMPRESSION: Mild diffuse reticular interstitial opacity suspicious for underlying chronic lung disease. Possible ground-glass infiltrate at left base. Electronically Signed   By: Luke Bun M.D.   On: 02/05/2023 21:21   DG Pelvis 1-2 Views Result Date: 02/05/2023 CLINICAL DATA:  Fall EXAM: PELVIS - 1-2 VIEW COMPARISON:  CT 05/02/2019 FINDINGS: SI joints are non widened. Pubic symphysis and rami appear intact. Interval severe arthritis of the left hip with bone on bone appearance and subarticular sclerosis. Sclerosis and lucency in the left femoral head with deformity, suspicious for AVN and partial collapse. IMPRESSION: 1. No acute osseous abnormality. 2. Interval severe arthritis of the left hip with findings suspicious for AVN and partial collapse of the left femoral head. Electronically Signed   By: Luke Bun M.D.   On: 02/05/2023 21:20   DG Lumbar Spine 2-3 Views Result Date:  02/05/2023 CLINICAL DATA:  Fall EXAM: LUMBAR SPINE - 2-3 VIEW COMPARISON:  None Available. FINDINGS: Trace anterolisthesis L2 on L3, L3 on L4 and L4 on L5. Vertebral body heights are maintained. Moderate severe diffuse degenerative changes with multilevel disc space narrowing and osteophyte. Multiple level facet degenerative change. Aortic atherosclerosis IMPRESSION: Moderate to severe diffuse degenerative changes. Electronically Signed   By: Luke Bun M.D.   On: 02/05/2023 21:13    Pending Labs Unresulted Labs (From admission, onward)     Start     Ordered   02/05/23 2334  Urine Culture  Once,   URGENT       Question:  Indication  Answer:  Urgency/frequency   02/05/23 2334            Vitals/Pain Today's Vitals   02/06/23 0720 02/06/23 0722 02/06/23 1039 02/06/23 1040  BP:  135/79 (!) 144/58 (!) 144/58  Pulse:  69 (!) 53 64  Resp:  (!) 29 17 19   Temp:  97.7 F (36.5 C) 97.6 F (36.4 C)   TempSrc:      SpO2:  93% 100% 99%  Weight:      Height:      PainSc: 3        Isolation Precautions No active isolations  Medications Medications  acetaminophen  (TYLENOL ) tablet 500 mg (has no administration in time range)    Or  acetaminophen  (TYLENOL ) suppository 650 mg (has no administration in time range)  ondansetron  (ZOFRAN ) tablet 4 mg (  has no administration in time range)    Or  ondansetron  (ZOFRAN ) injection 4 mg (has no administration in time range)  bisacodyl  (DULCOLAX) EC tablet 5 mg (has no administration in time range)  melatonin tablet 3 mg (has no administration in time range)  cefTRIAXone  (ROCEPHIN ) 1 g in sodium chloride  0.9 % 100 mL IVPB (1 g Intravenous New Bag/Given 02/06/23 1131)  Rivaroxaban  (XARELTO ) tablet 15 mg (15 mg Oral Given 02/06/23 1125)  ketoconazole  (NIZORAL ) 2 % shampoo 1 Application (1 Application Topical Not Given 02/06/23 1057)  acetaminophen  (TYLENOL ) tablet 1,000 mg (1,000 mg Oral Given 02/05/23 2017)  lactated ringers  bolus 1,000 mL (1,000 mLs  Intravenous Bolus 02/06/23 0016)  cefTRIAXone  (ROCEPHIN ) 1 g in sodium chloride  0.9 % 100 mL IVPB (0 g Intravenous Stopped 02/06/23 0017)  traMADol  (ULTRAM ) tablet 50 mg (50 mg Oral Given 02/06/23 0241)    Mobility walks with person assist     Focused Assessments Neuro Assessment Handoff:  Swallow screen pass?  N/A         Neuro Assessment: Within Defined Limits Neuro Checks:      Has TPA been given? No If patient is a Neuro Trauma and patient is going to OR before floor call report to 4N Charge nurse: [phone] or [phone]   R Recommendations: See Admitting Provider Note  Report given to:   Additional Notes: HOH

## 2023-02-06 NOTE — ED Notes (Signed)
 Patient son called for f/u, RN spoke with Alvino Chapel

## 2023-02-07 DIAGNOSIS — M8700 Idiopathic aseptic necrosis of unspecified bone: Secondary | ICD-10-CM | POA: Diagnosis not present

## 2023-02-07 DIAGNOSIS — M87052 Idiopathic aseptic necrosis of left femur: Secondary | ICD-10-CM | POA: Insufficient documentation

## 2023-02-07 DIAGNOSIS — N390 Urinary tract infection, site not specified: Secondary | ICD-10-CM | POA: Diagnosis not present

## 2023-02-07 LAB — COMPREHENSIVE METABOLIC PANEL
ALT: 22 U/L (ref 0–44)
AST: 34 U/L (ref 15–41)
Albumin: 2.8 g/dL — ABNORMAL LOW (ref 3.5–5.0)
Alkaline Phosphatase: 57 U/L (ref 38–126)
Anion gap: 7 (ref 5–15)
BUN: 21 mg/dL (ref 8–23)
CO2: 23 mmol/L (ref 22–32)
Calcium: 9 mg/dL (ref 8.9–10.3)
Chloride: 101 mmol/L (ref 98–111)
Creatinine, Ser: 0.55 mg/dL (ref 0.44–1.00)
GFR, Estimated: >60 mL/min (ref >60)
Glucose, Bld: 109 mg/dL — ABNORMAL HIGH (ref 70–99)
Potassium: 3.9 mmol/L (ref 3.5–5.1)
Sodium: 131 mmol/L — ABNORMAL LOW (ref 135–145)
Total Bilirubin: 0.9 mg/dL (ref 0.0–1.2)
Total Protein: 6 g/dL — ABNORMAL LOW (ref 6.5–8.1)

## 2023-02-07 LAB — BRAIN NATRIURETIC PEPTIDE: B Natriuretic Peptide: 415.5 pg/mL — ABNORMAL HIGH (ref 0.0–100.0)

## 2023-02-07 LAB — CBC
HCT: 34.2 % — ABNORMAL LOW (ref 36.0–46.0)
Hemoglobin: 11.1 g/dL — ABNORMAL LOW (ref 12.0–15.0)
MCH: 32 pg (ref 26.0–34.0)
MCHC: 32.5 g/dL (ref 30.0–36.0)
MCV: 98.6 fL (ref 80.0–100.0)
Platelets: 173 10*3/uL (ref 150–400)
RBC: 3.47 MIL/uL — ABNORMAL LOW (ref 3.87–5.11)
RDW: 13.5 % (ref 11.5–15.5)
WBC: 8.1 10*3/uL (ref 4.0–10.5)
nRBC: 0 % (ref 0.0–0.2)

## 2023-02-07 MED ORDER — TRAMADOL HCL 50 MG PO TABS
50.0000 mg | ORAL_TABLET | Freq: Two times a day (BID) | ORAL | Status: DC | PRN
  Administered 2023-02-07: 50 mg via ORAL
  Filled 2023-02-07: qty 1

## 2023-02-07 NOTE — Progress Notes (Signed)
 Pt's hearing aid was located by OT  today - pt is now in possession of both hearing aids. Pt's son, Rudell Cobb,  updated via phone by this RN. Voice mail left

## 2023-02-07 NOTE — Plan of Care (Signed)

## 2023-02-07 NOTE — Evaluation (Signed)
 Occupational Therapy Evaluation Patient Details Name: Mary Knapp MRN: [id number] DOB: [birth year] Today's Date: 02/07/2023   History of Present Illness Mary Knapp is a 89 y.o. female with medical history significant for atrial fibrillation on Xarelto , hypertension, hyperlipidemia, carotid artery disease, degenerative scoliosis, and peripheral arterial disease presents because of very painful lower extremities swelling. She has also had several falls recently   Clinical Impression   Pt presents with decline in function and safety with ADLs and ADL mobility with impaired strength, balance and endurance. PTA pt lived a home alone in a 2 story home with 2 STE, pt reports that she was Ind with ADLs and used a RW for mobility. Pt currently requires CGA to sit EB, max A + 2 sit - stand, max A SPT to BSC, ma x- total A with LB ADLs. Pt is very anxious and fearful of falling. Pt would benefit from acute OT services to address impairments to maximize level of function and safety, Recommending post acute rehab after acute stay d/c      If plan is discharge home, recommend the following: A lot of help with bathing/dressing/bathroom;Two people to help with walking and/or transfers;Assistance with cooking/housework;Assist for transportation    Functional Status Assessment  Patient has had a recent decline in their functional status and demonstrates the ability to make significant improvements in function in a reasonable and predictable amount of time.  Equipment Recommendations  Other (comment) (TBD at nex venue of care)    Recommendations for Other Services       Precautions / Restrictions Precautions Precautions: Fall Restrictions Weight Bearing Restrictions Per Provider Order: No      Mobility Bed Mobility Overal bed mobility: Needs Assistance Bed Mobility: Supine to Sit     Supine to sit: Contact guard, Used rails     General bed mobility comments: increased time and effort,  fixated on her LEs not being able to move although no physical assist was required    Transfers Overall transfer level: Needs assistance Equipment used: Rolling walker (2 wheels), 2 person hand held assist Transfers: Sit to/from Stand, Bed to chair/wheelchair/BSC Sit to Stand: Max assist           General transfer comment: CNA assisted with sit - stand from EOB, SPT to [facility]      Balance Overall balance assessment: Needs assistance Sitting-balance support: Feet supported Sitting balance-Leahy Scale: Fair     Standing balance support: Bilateral upper extremity supported, During functional activity Standing balance-Leahy Scale: Poor                             ADL either performed or assessed with clinical judgement   ADL Overall ADL's : Needs assistance/impaired Eating/Feeding: Independent   Grooming: Wash/dry hands;Wash/dry face;Contact guard assist;Sitting   Upper Body Bathing: Contact guard assist;Sitting   Lower Body Bathing: Maximal assistance   Upper Body Dressing : Contact guard assist;Sitting   Lower Body Dressing: Maximal assistance   Toilet Transfer: Maximal assistance;Cueing for safety;Cueing for sequencing;Stand-pivot;BSC/3in1   Toileting- Clothing Manipulation and Hygiene: Total assistance       Functional mobility during ADLs: Maximal assistance General ADL Comments: pt very fearful of falling and unable to fully extend knees     Vision Baseline Vision/History: 1 Wears glasses Ability to See in Adequate Light: 0 Adequate Patient Visual Report: No change from baseline       Perception  Praxis         Pertinent Vitals/Pain Pain Assessment Pain Assessment: No/denies pain     Extremity/Trunk Assessment Upper Extremity Assessment Upper Extremity Assessment: Generalized weakness   Lower Extremity Assessment Lower Extremity Assessment: Defer to PT evaluation       Communication Communication Communication: No  apparent difficulties Cueing Techniques: Verbal cues;Gestural cues   Cognition Arousal: Alert Behavior During Therapy: WFL for tasks assessed/performed Overall Cognitive Status: Within Functional Limits for tasks assessed                                 General Comments: very HOH, has B hearing aids, very fixated on not being able to walk and her age     General Comments       Exercises     Shoulder Instructions      Home Living Family/patient expects to be discharged to:: Private residence Living Arrangements: Alone Available Help at Discharge: Family Type of Home: House Home Access: Stairs to enter Secretary/administrator of Steps: 2   Home Layout: Two level;Bed/bath upstairs   Alternate Level Stairs-Rails: Right;Left Bathroom Shower/Tub: Chief Strategy Officer: Standard     Home Equipment: Agricultural Consultant (2 wheels)          Prior Functioning/Environment               Mobility Comments: Pt states that she uses a RW ADLs Comments: Pt states that she was Ind with ADLs/selfcare, does not drive        OT Problem List: Decreased strength;Impaired balance (sitting and/or standing);Decreased safety awareness;Decreased activity tolerance;Decreased coordination;Decreased knowledge of use of DME or AE      OT Treatment/Interventions: Self-care/ADL training;DME and/or AE instruction;Therapeutic activities;Balance training;Therapeutic exercise;Patient/family education    OT Goals(Current goals can be found in the care plan section) Acute Rehab OT Goals Patient Stated Goal: get better OT Goal Formulation: With patient Time For Goal Achievement: 02/21/23 Potential to Achieve Goals: Good ADL Goals Pt Will Perform Grooming: with supervision;sitting Pt Will Perform Upper Body Bathing: with supervision;sitting Pt Will Perform Lower Body Bathing: with mod assist;sitting/lateral leans Pt Will Perform Upper Body Dressing: with  supervision;sitting Pt Will Transfer to Toilet: with mod assist;with min assist;stand pivot transfer;bedside commode  OT Frequency: Min 1X/week    Co-evaluation              AM-PAC OT 6 Clicks Daily Activity     Outcome Measure Help from another person eating meals?: None Help from another person taking care of personal grooming?: A Little Help from another person toileting, which includes using toliet, bedpan, or urinal?: Total Help from another person bathing (including washing, rinsing, drying)?: A Lot Help from another person to put on and taking off regular upper body clothing?: A Little Help from another person to put on and taking off regular lower body clothing?: Total 6 Click Score: 14   End of Session Equipment Utilized During Treatment: Gait belt;Rolling walker (2 wheels) Nurse Communication: Mobility status  Activity Tolerance: Patient tolerated treatment well;Other (comment) (anxious, fearful) Patient left: in chair;with call bell/phone within reach;with chair alarm set  OT Visit Diagnosis: Unsteadiness on feet (R26.81);Other abnormalities of gait and mobility (R26.89);Muscle weakness (generalized) (M62.81);History of falling (Z91.81)                Time: 8843-8772 OT Time Calculation (min): 31 min Charges:  OT General Charges $OT Visit: 1 Visit  OT Evaluation $OT Eval Moderate Complexity: 1 Mod OT Treatments $Self Care/Home Management : 8-22 mins $Therapeutic Activity: 8-22 mins   Jacques Karna Loose 02/07/2023, 12:47 PM

## 2023-02-07 NOTE — Progress Notes (Signed)
 Progress Note   Patient: Mary Knapp FMW:[id number] DOB: 26-Mar-1926 DOA: 02/05/2023     2 DOS: the patient was seen and examined on 02/07/2023   Brief hospital course: Mary Knapp is a 89 y.o. evidently fairly functional female at baseline with medical history significant for atrial fibrillation on Xarelto , hypertension, hyperlipidemia, carotid artery disease, degenerative scoliosis, and peripheral arterial disease presents who presented with BL leg pain and increasing falls at home, though denied hitting her head.  In ED, CT head negative for bleed.  Found to have concern for AVN of femoral head.  Has never been hospitalized previously.  Incidentally a UTI was also discovered with positive nitrates and leukocyte esterase.  The patient will be admitted to the hospitalist service for symptomatic UTI with multiple falls as well as workup and treatment  of this painful new lower extremity edema  Assessment and Plan: UTI  -IV Rocephin  - denies any current symptoms.  - afebrile, WBC good  Painful bilateral lower extremity edema:  - reason for presentation.  - no real cardiac history.  Not on diuretics.  -Echocardiogram with EF of 70 to 75% and hyperdynamic function.  Bilateral atrial dilation. - will give 1 dose of lasix  tonight.   - family asking for cardiology consultation to see how much her heart may be contributing to her LE edema - checking B12  Gait instability  -Acute on chronic issue -X-ray with concern for AVN.  CT of hip ordered.  Will likely consult Ortho pending results.  4.  Atrial fibrillation - Continue Xarelto  and beta-blocker. -No hypotension  5.  Hyponatremia: -Mild and asymptomatic. -Will continue to monitor.  6. Normocytic anemia: -Age-related? -No history of renal disease. -Denies any bleeding.  - CBC in AM   Subjective: Overall feels pretty well this morning.  Is complaining of some bilateral leg pain.  Eating breakfast my exam.  Physical Exam: Vitals:    02/06/23 1744 02/06/23 2230 02/07/23 0645 02/07/23 1441  BP: 134/68 (!) 157/69 (!) 146/69 (!) 151/69  Pulse: 69 72 73 79  Resp: 15 16 16 18   Temp: 98.4 F (36.9 C) 98.1 F (36.7 C) 97.7 F (36.5 C) 98.2 F (36.8 C)  TempSrc:  Oral Oral Oral  SpO2: 97% 97% 92% 97%  Weight:      Height:       Physical Exam: General:  Pleasantly resting in bed, No acute distress. HEENT:  Normocephalic atraumatic.  Sclerae nonicteric, noninjected.  Extraocular movements intact bilaterally. Neck:  Without mass or deformity.  Trachea is midline. Lungs:  Clear to auscultate bilaterally without rhonchi, wheeze, or rales. Heart:  Regular rate and rhythm.  Without murmurs, rubs, or gallops. Abdomen:  Soft, nontender, nondistended.  Without guarding or rebound. Extremities: Without cyanosis, clubbing, edema.  Some tenderness with internal and external rotation of BL LE's Skin:  Warm and dry, no erythema.  Family Communication: Long phone discussion this afternoon with daughter, son-in-law who is family physician and hospitalist, granddaughter who is also a family physician and hospitalist.  They expressed some concern over her heart and recommended cardiology consultation.  Did discuss results of x-ray and that we are ordering CT scan.  They would prefer orthopedic consultation pending results of CT scan.  Very helpful conversation in regards to patient's baseline functional status which is very high.  Lives alone at home.  Drives.  They would like for her to be able to return back to home independently if at all possible.  Disposition: Status is:  Inpatient Remains inpatient appropriate because: concern for hip fracture  Planned Discharge Destination: Home    Time spent: 55 minutes  Author: Reyes VEAR Gaw, MD 02/07/2023 5:19 PM  For on call review [email or web address].

## 2023-02-08 ENCOUNTER — Inpatient Hospital Stay (HOSPITAL_COMMUNITY): Payer: Medicare Other

## 2023-02-08 DIAGNOSIS — M87052 Idiopathic aseptic necrosis of left femur: Secondary | ICD-10-CM | POA: Diagnosis not present

## 2023-02-08 DIAGNOSIS — N3000 Acute cystitis without hematuria: Secondary | ICD-10-CM | POA: Diagnosis not present

## 2023-02-08 LAB — FOLATE: Folate: 20.7 ng/mL (ref >5.9)

## 2023-02-08 LAB — CBC
HCT: 33.6 % — ABNORMAL LOW (ref 36.0–46.0)
Hemoglobin: 11.2 g/dL — ABNORMAL LOW (ref 12.0–15.0)
MCH: 32.7 pg (ref 26.0–34.0)
MCHC: 33.3 g/dL (ref 30.0–36.0)
MCV: 98.2 fL (ref 80.0–100.0)
Platelets: 188 10*3/uL (ref 150–400)
RBC: 3.42 MIL/uL — ABNORMAL LOW (ref 3.87–5.11)
RDW: 13.5 % (ref 11.5–15.5)
WBC: 9.2 10*3/uL (ref 4.0–10.5)
nRBC: 0 % (ref 0.0–0.2)

## 2023-02-08 LAB — BASIC METABOLIC PANEL
Anion gap: 10 (ref 5–15)
BUN: 18 mg/dL (ref 8–23)
CO2: 25 mmol/L (ref 22–32)
Calcium: 9.2 mg/dL (ref 8.9–10.3)
Chloride: 98 mmol/L (ref 98–111)
Creatinine, Ser: 0.64 mg/dL (ref 0.44–1.00)
GFR, Estimated: >60 mL/min (ref >60)
Glucose, Bld: 110 mg/dL — ABNORMAL HIGH (ref 70–99)
Potassium: 4.3 mmol/L (ref 3.5–5.1)
Sodium: 133 mmol/L — ABNORMAL LOW (ref 135–145)

## 2023-02-08 LAB — URINE CULTURE: Culture: >=100000 — AB

## 2023-02-08 LAB — VITAMIN B12: Vitamin B-12: 680 pg/mL (ref 180–914)

## 2023-02-08 MED ORDER — METOPROLOL SUCCINATE ER 25 MG PO TB24
25.0000 mg | ORAL_TABLET | Freq: Every day | ORAL | Status: DC
  Administered 2023-02-08 – 2023-02-14 (×7): 25 mg via ORAL
  Filled 2023-02-08 (×7): qty 1

## 2023-02-08 MED ORDER — ACETAMINOPHEN 500 MG PO TABS
500.0000 mg | ORAL_TABLET | Freq: Four times a day (QID) | ORAL | Status: DC
  Administered 2023-02-08 – 2023-02-12 (×13): 500 mg via ORAL
  Filled 2023-02-08 (×14): qty 1

## 2023-02-08 MED ORDER — FUROSEMIDE 10 MG/ML IJ SOLN
20.0000 mg | Freq: Once | INTRAMUSCULAR | Status: AC
  Administered 2023-02-08: 20 mg via INTRAVENOUS
  Filled 2023-02-08: qty 2

## 2023-02-08 NOTE — Plan of Care (Signed)

## 2023-02-08 NOTE — Progress Notes (Signed)
 When turning the pt the hearing aid that was supposedly lost in the ED was found. The hearing aid was placed with the other  in a pink denture cup with a patient label on it. The cup is now on the bedside table beside the pt. Charge nurse made aware.

## 2023-02-08 NOTE — Consult Note (Addendum)
 Patient ID: Mary Knapp MRN: [id number] DOB/AGE: 88-May-1928 88 y.o.  Admit date: 02/05/2023  Admission Diagnoses:  Principal Problem:   UTI (urinary tract infection) Active Problems:   PAF (paroxysmal atrial fibrillation) (HCC)   Gait abnormality   Hearing loss   Falls   Chronic anticoagulation   AVN (avascular necrosis of bone) (HCC)   HPI: Ortho consult for left hip AVN with femoral head collapse. She has been having frequent falls and imaging taken during admission demonstrated the above findings.  PMH notable for atrial fibrillation on Xarelto , hypertension, hyperlipidemia, carotid artery disease, degenerative scoliosis, and peripheral arterial disease.  Denies numbness/tingling.  Past Medical History: Past Medical History:  Diagnosis Date   Carotid artery disease (HCC)    nonobstructive   Chronic anticoagulation    H/O atrial flutter    Hyperlipidemia    Hypertension    PAF (paroxysmal atrial fibrillation) (HCC)    Valvular heart disease     Surgical History: Past Surgical History:  Procedure Laterality Date   childbirth     x4    Family History: Family History  Problem Relation Age of Onset   Heart disease Father     Social History: Social History   Socioeconomic History   Marital status: Widowed    Spouse name: Not on file   Number of children: Not on file   Years of education: Not on file   Highest education level: Not on file  Occupational History   Not on file  Tobacco Use   Smoking status: Former    Types: Cigarettes   Smokeless tobacco: Never  Substance and Sexual Activity   Alcohol use: No   Drug use: No   Sexual activity: Not on file  Other Topics Concern   Not on file  Social History Narrative   Not on file   Social Drivers of Health   Financial Resource Strain: Not on file  Food Insecurity: No Food Insecurity (02/06/2023)   Hunger Vital Sign    Worried About Running Out of Food in the Last Year: Never true    Ran Out  of Food in the Last Year: Never true  Transportation Needs: No Transportation Needs (02/06/2023)   PRAPARE - Administrator, Civil Service (Medical): No    Lack of Transportation (Non-Medical): No  Physical Activity: Not on file  Stress: Not on file  Social Connections: Moderately Integrated (02/06/2023)   Social Connection and Isolation Panel [NHANES]    Frequency of Communication with Friends and Family: More than three times a week    Frequency of Social Gatherings with Friends and Family: Twice a week    Attends Religious Services: More than 4 times per year    Active Member of [employer] or Organizations: Yes    Attends Banker Meetings: 1 to 4 times per year    Marital Status: Widowed  Intimate Partner Violence: Not At Risk (02/06/2023)   Humiliation, Afraid, Rape, and Kick questionnaire    Fear of Current or Ex-Partner: No    Emotionally Abused: No    Physically Abused: No    Sexually Abused: No    Allergies: Rosuvastatin   Medications: I have reviewed the patient's current medications.  Vital Signs: Patient Vitals for the past 24 hrs:  BP Temp Temp src Pulse Resp SpO2  02/08/23 0730 (!) 162/72 98.9 F (37.2 C) Oral 83 16 92 %  02/07/23 2129 (!) 154/66 98.3 F (36.8 C) Oral 74 14 97 %  02/07/23 1441 (!) 151/69 98.2 F (36.8 C) Oral 79 18 97 %    Radiology: ECHOCARDIOGRAM COMPLETE Result Date: 02/06/2023    ECHOCARDIOGRAM REPORT   Patient Name:   Mary Knapp Date of Exam: 02/06/2023 Medical Rec #:  [id number]     Height:       61.0 in Accession #:    [id number]    Weight:       125.0 lb Date of Birth:  09/04/1926     BSA:          1.547 m Patient Age:    89 years      BP:           130/59 mmHg Patient Gender: F             HR:           68 bpm. Exam Location:  Inpatient Procedure: 2D Echo, Color Doppler and Cardiac Doppler Indications:    I48.91* Unspeicified atrial fibrillation  History:        Patient has no prior history of Echocardiogram examinations.                  CAD, Arrythmias:Atrial Fibrillation and Atrial Flutter; Risk                 Factors:Hypertension.  Sonographer:    Lanell Maduro Referring Phys: [id number] CLAUDIA CLAIBORNE IMPRESSIONS  1. Left ventricular ejection fraction, by estimation, is 70 to 75%. Left ventricular ejection fraction by 2D MOD biplane is 71.0 %. The left ventricle has hyperdynamic function. The left ventricle has no regional wall motion abnormalities. There is moderate asymmetric left ventricular hypertrophy of the basal-septal segment. Left ventricular diastolic function could not be evaluated.  2. Right ventricular systolic function is low normal. The right ventricular size is normal. There is moderately elevated pulmonary artery systolic pressure. The estimated right ventricular systolic pressure is 59.4 mmHg.  3. Left atrial size was severely dilated.  4. Right atrial size was severely dilated.  5. The mitral valve is abnormal. Mild to moderate mitral valve regurgitation.  6. Leaflet thickening is noted. The tricuspid valve is abnormal. Tricuspid valve regurgitation is moderate to severe.  7. The aortic valve is tricuspid. Aortic valve regurgitation is trivial. Aortic valve sclerosis/calcification is present, without any evidence of aortic stenosis. Aortic regurgitation PHT measures 484 msec.  8. The inferior vena cava is dilated in size with <50% respiratory variability, suggesting right atrial pressure of 15 mmHg. Comparison(s): No prior Echocardiogram. FINDINGS  Left Ventricle: Left ventricular ejection fraction, by estimation, is 70 to 75%. Left ventricular ejection fraction by 2D MOD biplane is 71.0 %. The left ventricle has hyperdynamic function. The left ventricle has no regional wall motion abnormalities. The left ventricular internal cavity size was normal in size. There is moderate asymmetric left ventricular hypertrophy of the basal-septal segment. Left ventricular diastolic function could not be evaluated due to atrial  fibrillation. Left ventricular diastolic function could not be evaluated. Right Ventricle: The right ventricular size is normal. No increase in right ventricular wall thickness. Right ventricular systolic function is low normal. There is moderately elevated pulmonary artery systolic pressure. The tricuspid regurgitant velocity  is 3.33 m/s, and with an assumed right atrial pressure of 15 mmHg, the estimated right ventricular systolic pressure is 59.4 mmHg. Left Atrium: Left atrial size was severely dilated. Right Atrium: Right atrial size was severely dilated. Pericardium: There is no evidence of pericardial effusion. Mitral Valve: The mitral valve is  abnormal. There is mild thickening of the anterior and posterior mitral valve leaflet(s). There is mild calcification of the anterior and posterior mitral valve leaflet(s). Mild to moderate mitral valve regurgitation, with centrally-directed jet. Tricuspid Valve: Leaflet thickening is noted. The tricuspid valve is abnormal. Tricuspid valve regurgitation is moderate to severe. Aortic Valve: The aortic valve is tricuspid. Aortic valve regurgitation is trivial. Aortic regurgitation PHT measures 484 msec. Aortic valve sclerosis/calcification is present, without any evidence of aortic stenosis. Pulmonic Valve: The pulmonic valve was grossly normal. Pulmonic valve regurgitation is mild. Aorta: The aortic root and ascending aorta are structurally normal, with no evidence of dilitation. Venous: The inferior vena cava is dilated in size with less than 50% respiratory variability, suggesting right atrial pressure of 15 mmHg. IAS/Shunts: No atrial level shunt detected by color flow Doppler.  LEFT VENTRICLE PLAX 2D                        Biplane EF (MOD) LVIDd:         4.10 cm         LV Biplane EF:   Left LVIDs:         2.20 cm                          ventricular LV PW:         0.90 cm                          ejection LV IVS:        1.20 cm                          fraction by  LVOT diam:     1.80 cm                          2D MOD LV SV:         65                               biplane is LV SV Index:   42                               71.0 %. LVOT Area:     2.54 cm                                Diastology                                LV e' medial:    7.07 cm/s LV Volumes (MOD)               LV E/e' medial:  21.4 LV vol d, MOD    34.4 ml       LV e' lateral:   12.30 cm/s A2C:                           LV E/e' lateral: 12.3 LV vol d, MOD    44.1 ml A4C: LV vol s, MOD    9.9 ml  A2C: LV vol s, MOD    12.3 ml A4C: LV SV MOD A2C:   24.5 ml LV SV MOD A4C:   44.1 ml LV SV MOD BP:    28.3 ml RIGHT VENTRICLE             IVC RV Basal diam:  4.40 cm     IVC diam: 2.20 cm RV Mid diam:    2.50 cm RV S prime:     10.60 cm/s TAPSE (M-mode): 1.9 cm LEFT ATRIUM             Index        RIGHT ATRIUM           Index LA diam:        5.10 cm 3.30 cm/m   RA Area:     27.00 cm LA Vol (A2C):   75.4 ml 48.75 ml/m  RA Volume:   85.20 ml  55.08 ml/m LA Vol (A4C):   79.6 ml 51.46 ml/m LA Biplane Vol: 77.1 ml 49.85 ml/m  AORTIC VALVE             PULMONIC VALVE LVOT Vmax:   112.00 cm/s PR End Diast Vel: 1.40 msec LVOT Vmean:  79.600 cm/s LVOT VTI:    0.257 m AI PHT:      484 msec  AORTA Ao Root diam: 2.60 cm Ao Asc diam:  3.50 cm MITRAL VALVE                TRICUSPID VALVE MV Area (PHT): 3.48 cm     TR Peak grad:   44.4 mmHg MV Decel Time: 218 msec     TR Vmax:        333.00 cm/s MR Peak grad: 96.4 mmHg MR Mean grad: 66.0 mmHg     SHUNTS MR Vmax:      491.00 cm/s   Systemic VTI:  0.26 m MR Vmean:     381.0 cm/s    Systemic Diam: 1.80 cm MV E velocity: 151.00 cm/s Vinie Maxcy MD Electronically signed by Vinie Maxcy MD Signature Date/Time: 02/06/2023/11:09:26 AM    Final    CT Head Wo Contrast Result Date: 02/05/2023 CLINICAL DATA:  Head trauma fell out of chair EXAM: CT HEAD WITHOUT CONTRAST TECHNIQUE: Contiguous axial images were obtained from the base of the skull through the vertex without intravenous  contrast. RADIATION DOSE REDUCTION: This exam was performed according to the departmental dose-optimization program which includes automated exposure control, adjustment of the mA and/or kV according to patient size and/or use of iterative reconstruction technique. COMPARISON:  None Available. FINDINGS: Brain: No acute territorial infarction, hemorrhage or intracranial mass. Moderate atrophy and chronic small vessel ischemic changes of the white matter. Prominent ventricles felt secondary to atrophy. Vascular: No hyperdense vessels.  Carotid vascular calcification Skull: Normal. Negative for fracture or focal lesion. Sinuses/Orbits: No acute finding. Other: None IMPRESSION: 1. No CT evidence for acute intracranial abnormality. 2. Moderate atrophy and chronic small vessel ischemic changes of the white matter. Electronically Signed   By: Luke Bun M.D.   On: 02/05/2023 22:27   DG Chest Port 1 View Result Date: 02/05/2023 CLINICAL DATA:  Fall EXAM: PORTABLE CHEST 1 VIEW COMPARISON:  None Available. FINDINGS: Mild diffuse reticular interstitial opacity suspicious for underlying chronic lung disease. Possible ground-glass infiltrate at left base. Upper normal cardiac silhouette with aortic atherosclerosis. No pneumothorax IMPRESSION: Mild diffuse reticular interstitial opacity suspicious for underlying chronic lung disease. Possible ground-glass infiltrate at left base.  Electronically Signed   By: Luke Bun M.D.   On: 02/05/2023 21:21   DG Pelvis 1-2 Views Result Date: 02/05/2023 CLINICAL DATA:  Fall EXAM: PELVIS - 1-2 VIEW COMPARISON:  CT 05/02/2019 FINDINGS: SI joints are non widened. Pubic symphysis and rami appear intact. Interval severe arthritis of the left hip with bone on bone appearance and subarticular sclerosis. Sclerosis and lucency in the left femoral head with deformity, suspicious for AVN and partial collapse. IMPRESSION: 1. No acute osseous abnormality. 2. Interval severe arthritis of the left  hip with findings suspicious for AVN and partial collapse of the left femoral head. Electronically Signed   By: Luke Bun M.D.   On: 02/05/2023 21:20   DG Lumbar Spine 2-3 Views Result Date: 02/05/2023 CLINICAL DATA:  Fall EXAM: LUMBAR SPINE - 2-3 VIEW COMPARISON:  None Available. FINDINGS: Trace anterolisthesis L2 on L3, L3 on L4 and L4 on L5. Vertebral body heights are maintained. Moderate severe diffuse degenerative changes with multilevel disc space narrowing and osteophyte. Multiple level facet degenerative change. Aortic atherosclerosis IMPRESSION: Moderate to severe diffuse degenerative changes. Electronically Signed   By: Luke Bun M.D.   On: 02/05/2023 21:13    Labs: Recent Labs    02/07/23 0602 02/08/23 0917  WBC 8.1 9.2  RBC 3.47* 3.42*  HCT 34.2* 33.6*  PLT 173 188   Recent Labs    02/07/23 0602 02/08/23 0917  NA 131* 133*  K 3.9 4.3  CL 101 98  CO2 23 25  BUN 21 18  CREATININE 0.55 0.64  GLUCOSE 109* 110*  CALCIUM  9.0 9.2   No results for input(s): LABPT, INR in the last 72 hours.  Review of Systems: ROS as detailed in HPI  Physical Exam: Body mass index is 23.62 kg/m.  Physical Exam   Gen: AAOx3, NAD Comfortable at rest  Left Lower Extremity: Skin intact Mild-moderate pain with ROM of left hip ADF/APF/EHL 5/5 SILT throughout DP, PT 2+ to palp CR < 2s   Assessment and Plan: Ortho consult for left hip chronic AVN with femoral head collapse  -history, exam and imaging reviewed at length with patient -she will benefit from left total hip arthroplasty but this may done in outpatient setting -she will follow up outpatient with Arthroplasty colleagues for discussion of possible complex total hip replacement (please call below number to coordinate scheduling) -WBAT LLE with walker and 2 person assist at all times -PT/OT for help with mobilization  Lillia Mountain, MD Orthopaedic Surgeon [facility] [phone]  The risks and  benefits were presented and reviewed. The risks include hardware failure/irritation, dislocation, new/persistent/recurrent infection, stiffness, nerve/vessel/tendon injury, nonunion/malunion of any fracture, wound healing issues, allograft usage, failure of this surgery, possibility of external fixation in certain situations, possibility of delayed definitive surgery, need for further surgery, prolonged wound care including further soft tissue coverage procedures, thromboembolic events, anesthesia/medical complications/events perioperatively and beyond, amputation, death among others.

## 2023-02-08 NOTE — Progress Notes (Signed)
 PROGRESS NOTE  VEGAS COFFIN  FMW:[id number] DOB: 1926/06/22 DOA: 02/05/2023 PCP: Signa Rush, MD (Inactive)  Consultants  Brief Narrative: Mary Knapp is a 89 y.o. evidently fairly functional female at baseline with medical history significant for atrial fibrillation on Xarelto , hypertension, hyperlipidemia, carotid artery disease, degenerative scoliosis, and peripheral arterial disease presents who presented with BL leg pain and increasing falls at home, though denied hitting her head.  In ED, CT head negative for bleed.  Found to have concern for AVN of femoral head.  Has never been hospitalized previously.  Incidentally a UTI was also discovered with positive nitrates and leukocyte esterase.     Assessment and Plan: AVN of hip: -Left sided with femoral head collapse. -Appreciate orthopedic consultation.  Recommends complex total hip replacement. -Family and patient unsure if she would like to proceed with this. -Weightbearing as tolerated left lower extremity with walker and two-person assist. -PT and OT to help with mobilization. -Scheduled Tylenol  for pain relief.  Family asks for nothing stronger to ensure she does not become delirious with narcotic pain medication.  She is evidently opioid nave. -That said we will want to ensure we provide adequate pain control for her.  2.  Cards: -With known A-fib.  She is on Xarelto  and rate controlled without being on any oral rate control medications -Echo findings with concern for new right sided/diastolic heart failure.  She had lower extremity edema on admission which is slowly improved with 1 dose of Lasix . -Cardiology consulted for further evaluation recommendations.  Family appreciative of this. -Would also like cardiology to weigh in on patient's cardiac risk factors if family proceeds with total hip replacement.  3.  Painful bilateral lower extremity edema:  - reason for presentation.  -Almost sounds to be some degree of peripheral  neuropathy.  On review of her records she has had this going back to at least 2023 and she had negative workup by vascular surgery for concern for PAD. -There is combination of lower extremity foot pain when trying to stand as well as this new or hip pain when she tries to stand but they seem to be 2 completely separate problems. - checking B12   4.  Atrial fibrillation - Continue Xarelto .  Her beta-blocker had been held on admission but she has remained hypertensive while in house.  Will restart her home beta-blocker and await cardiology input. -No hypotension   5.  Hyponatremia: -Mild and asymptomatic. -Will continue to monitor.   6. Normocytic anemia: -Age-related? -No history of renal disease. -Denies any bleeding.  - CBC in AM       DVT prophylaxis:   Rivaroxaban  (XARELTO ) tablet 15 mg  Code Status:   Code Status: Limited: Do not attempt resuscitation (DNR) -DNR-LIMITED -Do Not Intubate/DNI  Family Communication: Spoke with patient's daughter, both sons, granddaughter, son-in-law.  Son-in-law and granddaughter are hospitalist. Level of care: Med-Surg Status is: Inpatient Remains inpatient appropriate because: Requiring ongoing therapies.   Consults called: Orthopedics and cardiology  Subjective: Mary Knapp feels a little bit better today.  She found her hearing aids and feels much better from that standpoint.  She is eating and drinking well although complaining about the food.  Still with some pain.  She has not really tried to get up out of bed yet.  Objective: Vitals:   02/07/23 2129 02/08/23 0730 02/08/23 1529 02/08/23 1531  BP: (!) 154/66 (!) 162/72  (!) 144/70  Pulse: 74 83 83 75  Resp: 14 16  18  Temp: 98.3 F (36.8 C) 98.9 F (37.2 C)  98.6 F (37 C)  TempSrc: Oral Oral    SpO2: 97% 92% 100% 93%  Weight:      Height:        Intake/Output Summary (Last 24 hours) at 02/08/2023 1702 Last data filed at 02/08/2023 1512 Gross per 24 hour  Intake 494.57 ml   Output --  Net 494.57 ml   Filed Weights   02/05/23 1824  Weight: 56.7 kg   Body mass index is 23.62 kg/m.  Gen: 89 y.o. female in no apparent distress.  Nontoxic Pulm: Non-labored breathing.  Clear to auscultation bilaterally.  CV: Regular rate and rhythm. No murmur, rub, or gallop. No JVD GI: Abdomen soft, non-tender, non-distended, with normoactive bowel sounds. No organomegaly or masses felt. Ext: Warm, no deformities, resolving BL LE edema.  Still trace present MSK: Has notable bruise buttock/right gluteal region.  Tender to palpation here. Skin: No rashes, lesionsNeuro: Alert and oriented. No focal neurological deficits. Psych: Calm  Judgement and insight appear normal. Mood & affect appropriate.     I have personally reviewed the following labs and images: CBC: Recent Labs  Lab 02/05/23 1826 02/06/23 0544 02/07/23 0602 02/08/23 0917  WBC 9.9 8.7 8.1 9.2  HGB 12.8 10.6* 11.1* 11.2*  HCT 38.7 32.5* 34.2* 33.6*  MCV 98.2 99.1 98.6 98.2  PLT 173 155 173 188   BMP &GFR Recent Labs  Lab 02/05/23 1826 02/06/23 0544 02/07/23 0602 02/08/23 0917  NA 132* 132* 131* 133*  K 4.2 3.8 3.9 4.3  CL 96* 97* 101 98  CO2 26 24 23 25   GLUCOSE 113* 116* 109* 110*  BUN 27* 26* 21 18  CREATININE 0.68 0.73 0.55 0.64  CALCIUM  9.7 9.1 9.0 9.2   Estimated Creatinine Clearance: 32.4 mL/min (by C-G formula based on SCr of 0.64 mg/dL). Liver & Pancreas: Recent Labs  Lab 02/05/23 1826 02/07/23 0602  AST 46* 34  ALT 26 22  ALKPHOS 67 57  BILITOT 1.3* 0.9  PROT 7.0 6.0*  ALBUMIN 3.4* 2.8*   No results for input(s): LIPASE, AMYLASE in the last 168 hours. No results for input(s): AMMONIA in the last 168 hours. Diabetic: No results for input(s): HGBA1C in the last 72 hours. Recent Labs  Lab 02/05/23 1826  GLUCAP 96   Cardiac Enzymes: Recent Labs  Lab 02/05/23 1826  CKTOTAL 515*   No results for input(s): PROBNP in the last 8760 hours. Coagulation  Profile: No results for input(s): INR, PROTIME in the last 168 hours. Thyroid Function Tests: No results for input(s): TSH, T4TOTAL, FREET4, T3FREE, THYROIDAB in the last 72 hours. Lipid Profile: No results for input(s): CHOL, HDL, LDLCALC, TRIG, CHOLHDL, LDLDIRECT in the last 72 hours. Anemia Panel: Recent Labs    02/08/23 0917  VITAMINB12 680  FOLATE 20.7   Urine analysis:    Component Value Date/Time   COLORURINE YELLOW 02/05/2023 2100   APPEARANCEUR HAZY (A) 02/05/2023 2100   LABSPEC 1.020 02/05/2023 2100   PHURINE 6.0 02/05/2023 2100   GLUCOSEU NEGATIVE 02/05/2023 2100   HGBUR NEGATIVE 02/05/2023 2100   BILIRUBINUR NEGATIVE 02/05/2023 2100   KETONESUR NEGATIVE 02/05/2023 2100   PROTEINUR 30 (A) 02/05/2023 2100   NITRITE POSITIVE (A) 02/05/2023 2100   LEUKOCYTESUR LARGE (A) 02/05/2023 2100   Sepsis Labs: Invalid input(s): PROCALCITONIN, LACTICIDVEN  Microbiology: Recent Results (from the past 240 hours)  Urine Culture     Status: Abnormal   Collection Time: 02/05/23  9:00 PM  Specimen: Urine, Clean Catch  Result Value Ref Range Status   Specimen Description   Final    URINE, CLEAN CATCH Performed at [facility], [address], [place], [place] [address]    Special Requests   Final    NONE Performed at [facility], [address] [place], [place] [address]    Culture >=100,000 COLONIES/mL ESCHERICHIA COLI (A)  Final   Report Status 02/08/2023 FINAL  Final   Organism ID, Bacteria ESCHERICHIA COLI (A)  Final      Susceptibility   Escherichia coli - MIC*    AMPICILLIN <=2 SENSITIVE Sensitive     CEFAZOLIN  <=4 SENSITIVE Sensitive     CEFEPIME <=0.12 SENSITIVE Sensitive     CEFTRIAXONE  <=0.25 SENSITIVE Sensitive     CIPROFLOXACIN <=0.25 SENSITIVE Sensitive     GENTAMICIN <=1 SENSITIVE Sensitive     IMIPENEM <=0.25 SENSITIVE Sensitive     NITROFURANTOIN <=16 SENSITIVE Sensitive      TRIMETH/SULFA <=20 SENSITIVE Sensitive     AMPICILLIN/SULBACTAM <=2 SENSITIVE Sensitive     PIP/TAZO <=4 SENSITIVE Sensitive ug/mL    * >=100,000 COLONIES/mL ESCHERICHIA COLI    Radiology Studies: No results found.  Scheduled Meds:  acetaminophen   500 mg Oral Q6H   feeding supplement  237 mL Oral BID BM   ketoconazole   1 Application Topical Once per day on Monday Thursday   Rivaroxaban   15 mg Oral Daily   Continuous Infusions:  cefTRIAXone  (ROCEPHIN )  IV 1 g (02/08/23 1326)     LOS: 3 days   55 minutes with more than 50% spent in reviewing records, counseling patient/family and coordinating care.  Reyes VEAR Gaw, MD [facility] [email or web address] 02/08/2023, 5:02 PM

## 2023-02-09 DIAGNOSIS — M87052 Idiopathic aseptic necrosis of left femur: Secondary | ICD-10-CM | POA: Diagnosis not present

## 2023-02-09 DIAGNOSIS — I071 Rheumatic tricuspid insufficiency: Secondary | ICD-10-CM

## 2023-02-09 DIAGNOSIS — N3000 Acute cystitis without hematuria: Secondary | ICD-10-CM | POA: Diagnosis not present

## 2023-02-09 MED ORDER — FUROSEMIDE 10 MG/ML IJ SOLN
40.0000 mg | Freq: Every day | INTRAMUSCULAR | Status: DC
  Administered 2023-02-10 – 2023-02-11 (×2): 40 mg via INTRAVENOUS
  Filled 2023-02-09 (×2): qty 4

## 2023-02-09 MED ORDER — CEFAZOLIN SODIUM-DEXTROSE 2-4 GM/100ML-% IV SOLN
2.0000 g | Freq: Two times a day (BID) | INTRAVENOUS | Status: DC
  Administered 2023-02-09 (×2): 2 g via INTRAVENOUS
  Filled 2023-02-09 (×2): qty 100

## 2023-02-09 NOTE — Evaluation (Signed)
 Physical Therapy Evaluation Patient Details Name: Mary Knapp MRN: [id number] DOB: 1926/11/17 Today's Date: 02/09/2023  History of Present Illness  Mary Knapp is a 89 y.o. female admitted from home with painful/edematous LEs, UTI and recent falls. On imaging noted AVN with partial collapse of L femoral head - orthopedic consult recommending THR - pt currently opposed to surgery but family will be discussing further 02/10/23 to come to decision. Pt with medical history significant for atrial fibrillation on Xarelto , hypertension, hyperlipidemia, carotid artery disease, degenerative scoliosis, and peripheral arterial disease.  Clinical Impression  Pt admitted as above and presenting with functional mobility limitations 2* generalized weakness, L knee pain, decreased ROM bil LEs; limited activity tolerance, and poor balance.  Pt currently requires assist of 2 for basic OOB mobility tasks and Patient will benefit from continued inpatient follow up therapy, <3 hours/day to maximize IND and safety.         If plan is discharge home, recommend the following: Two people to help with walking and/or transfers;A lot of help with bathing/dressing/bathroom;Assistance with cooking/housework;Assist for transportation;Help with stairs or ramp for entrance   Can travel by private vehicle   No    Equipment Recommendations None recommended by PT  Recommendations for Other Services       Functional Status Assessment Patient has had a recent decline in their functional status and demonstrates the ability to make significant improvements in function in a reasonable and predictable amount of time.     Precautions / Restrictions Precautions Precautions: Fall Restrictions Weight Bearing Restrictions Per Provider Order: Yes Other Position/Activity Restrictions: WBAT with RW and 2 person assist per ortho-consult      Mobility  Bed Mobility Overal bed mobility: Needs Assistance Bed Mobility: Supine to  Sit, Sit to Supine     Supine to sit: Min assist, Used rails Sit to supine: Min assist, Mod assist   General bed mobility comments: increaed time with steady assist and encouragement to EOB sitting but increased assist to bring LEs onto bed with return to supine    Transfers Overall transfer level: Needs assistance Equipment used: Rolling walker (2 wheels), 2 person hand held assist Transfers: Sit to/from Stand Sit to Stand: Mod assist, From elevated surface, +2 physical assistance, +2 safety/equipment           General transfer comment: cues for LE management, use of UEs to self assist. PHysical assist to bring wt up and fwd and to balance in standing with RW.  Pt unable to fully extend at knees and hips in standing.    Ambulation/Gait Ambulation/Gait assistance: Mod assist, +2 physical assistance, +2 safety/equipment Gait Distance (Feet): 3 Feet Assistive device: Rolling walker (2 wheels) Gait Pattern/deviations: Step-to pattern, Decreased step length - right, Decreased step length - left, Shuffle, Trunk flexed Gait velocity: decr     General Gait Details: flexed posture, cues for sequence and WB on RW - side-stepped up side of bed only  Stairs            Wheelchair Mobility     Tilt Bed    Modified Rankin (Stroke Patients Only)       Balance Overall balance assessment: Needs assistance Sitting-balance support: Feet supported Sitting balance-Leahy Scale: Fair     Standing balance support: Bilateral upper extremity supported, During functional activity Standing balance-Leahy Scale: Poor  Pertinent Vitals/Pain Pain Assessment Pain Assessment: Faces Faces Pain Scale: Hurts even more Pain Location: L LE focussed at knee with attempts to bring into extension Pain Descriptors / Indicators: Grimacing, Guarding Pain Intervention(s): Limited activity within patient's tolerance, Monitored during session    Home  Living Family/patient expects to be discharged to:: Private residence Living Arrangements: Alone Available Help at Discharge: Family Type of Home: House Home Access: Stairs to enter   Secretary/administrator of Steps: 2   Home Layout: Two level;Bed/bath upstairs Home Equipment: Agricultural Consultant (2 wheels)      Prior Function               Mobility Comments: Pt states that she uses a RW but hasn't walked in weeks.  Son is present and reports limited ambulation this past week ADLs Comments: Pt states that she was Ind with ADLs/selfcare, does not drive     Extremity/Trunk Assessment   Upper Extremity Assessment Upper Extremity Assessment: Generalized weakness    Lower Extremity Assessment Lower Extremity Assessment: Generalized weakness;RLE deficits/detail;LLE deficits/detail RLE Deficits / Details: ROM at knee ltd to my 10 ext LLE Deficits / Details: Pt in crook lying with knee at ~ 90 degrees but with slow gentle repeated ROM able to extend to ~-15 at knee. LLE: Unable to fully assess due to pain    Cervical / Trunk Assessment Cervical / Trunk Assessment: Kyphotic  Communication   Communication Communication: Hearing impairment Cueing Techniques: Verbal cues;Gestural cues  Cognition Arousal: Alert Behavior During Therapy: WFL for tasks assessed/performed Overall Cognitive Status: Within Functional Limits for tasks assessed                                 General Comments: very HOH, has B hearing aids, very fixated on not being able to walk and her age        General Comments      Exercises     Assessment/Plan    PT Assessment Patient needs continued PT services  PT Problem List Decreased strength;Decreased range of motion;Decreased activity tolerance;Decreased balance;Decreased mobility;Decreased knowledge of use of DME;Pain       PT Treatment Interventions DME instruction;Gait training;Functional mobility training;Therapeutic  activities;Therapeutic exercise;Balance training;Patient/family education    PT Goals (Current goals can be found in the Care Plan section)  Acute Rehab PT Goals Patient Stated Goal: Less pain; able to walk PT Goal Formulation: With patient Time For Goal Achievement: 02/23/23 Potential to Achieve Goals: Fair    Frequency Min 1X/week     Co-evaluation               AM-PAC PT 6 Clicks Mobility  Outcome Measure Help needed turning from your back to your side while in a flat bed without using bedrails?: A Little Help needed moving from lying on your back to sitting on the side of a flat bed without using bedrails?: A Little Help needed moving to and from a bed to a chair (including a wheelchair)?: A Lot Help needed standing up from a chair using your arms (e.g., wheelchair or bedside chair)?: A Lot Help needed to walk in hospital room?: Total Help needed climbing 3-5 steps with a railing? : Total 6 Click Score: 12    End of Session Equipment Utilized During Treatment: Gait belt;Oxygen Activity Tolerance: Patient limited by fatigue;Patient tolerated treatment well Patient left: in bed;with call bell/phone within reach;with bed alarm set;with family/visitor present Nurse Communication: Mobility  status PT Visit Diagnosis: Unsteadiness on feet (R26.81);Muscle weakness (generalized) (M62.81);History of falling (Z91.81);Difficulty in walking, not elsewhere classified (R26.2);Pain Pain - Right/Left: Left Pain - part of body: Knee    Time: 8642-8567 PT Time Calculation (min) (ACUTE ONLY): 35 min   Charges:   PT Evaluation $PT Eval Low Complexity: 1 Low PT Treatments $Therapeutic Activity: 8-22 mins PT General Charges $$ ACUTE PT VISIT: 1 Visit         Katrinka Acton PT Acute Rehabilitation Services Pager [phone] Office [phone]   Edahi Kroening 02/09/2023, 5:08 PM

## 2023-02-09 NOTE — Consult Note (Signed)
 Cardiology Consultation   Patient ID: MAYU RONK MRN: [id number]; DOB: 12/29/1926  Admit date: 02/05/2023 Date of Consult: 02/09/2023  PCP:  Signa Rush, MD (Inactive)   [facility] Providers Cardiologist:  Lynwood Schilling, MD  Patient Profile:   Mary Knapp is a 89 y.o. female with a hx of HTN who is being seen 02/09/2023 for the evaluation of LE  edema, atrial fibrillation and preop risk assessment   at the request of internal medicine   History of Present Illness:   [name] is a 89yo with hx of atrial fibrillation(on anticoagluation and rate control), HTN, PAD, HL   She is followedin in cardiolog by [facility].   She was last seen by CHRISTELLA Cage on 02/05/22  At that visit complained of weakness in legs, instability.  Not felt to be vascular The pt was admitted on 1/8 with frequent falls and UTI   She came to ER for painful LE edema    Reported fairly new    REported 2 to 3+ on admit Pt given 20 mg lasix  x 1   Last echo in 2014 LVEF 55 to 60% with mod MR, mod AI, mod/severe TR  THe pt is hard of hearing   Difficult historian   She denies CP   Breathing is fair   No palpitations  No dizziness    Has had some edema at home    The pt has  had frequent falls and has not been found to have L hip AVN with femoral head collapse  Past Medical History:  Diagnosis Date   Carotid artery disease (HCC)    nonobstructive   Chronic anticoagulation    H/O atrial flutter    Hyperlipidemia    Hypertension    PAF (paroxysmal atrial fibrillation) (HCC)    Valvular heart disease     Past Surgical History:  Procedure Laterality Date   childbirth     x4     Home Medications:  Prior to Admission medications   Medication Sig Start Date End Date Taking? Authorizing Provider  aspirin-sod bicarb-citric acid (ALKA-SELTZER) 325 MG TBEF tablet Take 325 mg by mouth every 6 (six) hours as needed (for heartburn, upset stomach, or indigestion).   Yes [provider]   benazepril -hydrochlorthiazide (LOTENSIN  HCT) 10-12.5 MG per tablet Take 0.5 tablets by mouth daily.  03/24/11  Yes [provider]  metoprolol  succinate (TOPROL -XL) 25 MG 24 hr tablet TAKE ONE TABLET BY MOUTH EVERY DAY 01/07/23  Yes Schilling Lynwood, MD  Rivaroxaban  (XARELTO ) 15 MG TABS tablet Take 1 tablet (15 mg total) by mouth daily. Needs cardiology appt for Xarelto  Refills, call office Patient taking differently: Take 15 mg by mouth daily. 02/03/23  Yes Cage Deatrice LABOR, MD  TYLENOL  500 MG tablet Take 500-1,000 mg by mouth every 8 (eight) hours as needed for mild pain (pain score 1-3) or headache.   Yes [provider]    Inpatient Medications: Scheduled Meds:  acetaminophen   500 mg Oral Q6H   feeding supplement  237 mL Oral BID BM   ketoconazole   1 Application Topical Once per day on Monday Thursday   metoprolol  succinate  25 mg Oral Daily   Rivaroxaban   15 mg Oral Daily   Continuous Infusions:  cefTRIAXone  (ROCEPHIN )  IV Stopped (02/08/23 1356)     Allergies:    Allergies  Allergen Reactions   Rosuvastatin  Other (See Comments)    Muscle pain    Social History:  Social History   Socioeconomic History   Marital status: Widowed    Spouse name: Not on file   Number of children: Not on file   Years of education: Not on file   Highest education level: Not on file  Occupational History   Not on file  Tobacco Use   Smoking status: Former    Types: Cigarettes   Smokeless tobacco: Never  Substance and Sexual Activity   Alcohol use: No   Drug use: No   Sexual activity: Not on file  Other Topics Concern   Not on file  Social History Narrative   Not on file   Social Drivers of Health   Financial Resource Strain: Not on file  Food Insecurity: No Food Insecurity (02/06/2023)   Hunger Vital Sign    Worried About Running Out of Food in the Last Year: Never true    Ran Out of Food in the Last Year: Never true  Transportation Needs: No Transportation  Needs (02/06/2023)   PRAPARE - Administrator, Civil Service (Medical): No    Lack of Transportation (Non-Medical): No  Physical Activity: Not on file  Stress: Not on file  Social Connections: Moderately Integrated (02/06/2023)   Social Connection and Isolation Panel [NHANES]    Frequency of Communication with Friends and Family: More than three times a week    Frequency of Social Gatherings with Friends and Family: Twice a week    Attends Religious Services: More than 4 times per year    Active Member of [employer] or Organizations: Yes    Attends Banker Meetings: 1 to 4 times per year    Marital Status: Widowed  Intimate Partner Violence: Not At Risk (02/06/2023)   Humiliation, Afraid, Rape, and Kick questionnaire    Fear of Current or Ex-Partner: No    Emotionally Abused: No    Physically Abused: No    Sexually Abused: No    Family History:    Family History  Problem Relation Age of Onset   Heart disease Father      ROS:  Please see the history of present illness.   All other ROS reviewed and negative.     Physical Exam/Data:   Vitals:   02/08/23 1531 02/08/23 1750 02/08/23 2140 02/09/23 0452  BP: (!) 144/70 (!) 135/59 129/76 125/61  Pulse: 75 81 73 66  Resp: 18  16 18   Temp: 98.6 F (37 C)  98 F (36.7 C) 98.7 F (37.1 C)  TempSrc:   Oral Oral  SpO2: 93%  93% 100%  Weight:      Height:        Intake/Output Summary (Last 24 hours) at 02/09/2023 0656 Last data filed at 02/09/2023 0105 Gross per 24 hour  Intake 457 ml  Output --  Net 457 ml      02/05/2023    6:24 PM 02/05/2022    9:18 AM 01/03/2022    4:08 PM  Last 3 Weights  Weight (lbs) 125 lb 120 lb 121 lb 3.2 oz  Weight (kg) 56.7 kg 54.432 kg 54.976 kg     Body mass index is 23.62 kg/m.  General:  Well nourished, well developed, in no acute distress HEENT: normal Neck: JVP is normal   No bruits  Cardiac:  Irreg Irreg  No S3  II/VI systolic murmur LSB   Lungs:  clear to auscultation  bilaterally Ext: Tr to 1 +LE edema  EKG:  The EKG was personally reviewed and  demonstrates:  Atrial fib  76 bpm   Telemetry:  Telemetry was personally reviewed and demonstrates:  Afib  70s   Relevant CV Studies: Echo 02/05/22  1. Left ventricular ejection fraction, by estimation, is 70 to 75%. Left  ventricular ejection fraction by 2D MOD biplane is 71.0 %. The left  ventricle has hyperdynamic function. The left ventricle has no regional  wall motion abnormalities. There is  moderate asymmetric left ventricular hypertrophy of the basal-septal  segment. Left ventricular diastolic function could not be evaluated.   2. Right ventricular systolic function is low normal. The right  ventricular size is normal. There is moderately elevated pulmonary artery  systolic pressure. The estimated right ventricular systolic pressure is  59.4 mmHg.   3. Left atrial size was severely dilated.   4. Right atrial size was severely dilated.   5. The mitral valve is abnormal. Mild to moderate mitral valve  regurgitation.   6. Leaflet thickening is noted. The tricuspid valve is abnormal.  Tricuspid valve regurgitation is moderate to severe.   7. The aortic valve is tricuspid. Aortic valve regurgitation is trivial.  Aortic valve sclerosis/calcification is present, without any evidence of  aortic stenosis. Aortic regurgitation PHT measures 484 msec.   8. The inferior vena cava is dilated in size with <50% respiratory  variability, suggesting right atrial pressure of 15 mmHg.    Laboratory Data:  High Sensitivity Troponin:  No results for input(s): TROPONINIHS in the last 720 hours.   Chemistry Recent Labs  Lab 02/06/23 0544 02/07/23 0602 02/08/23 0917  NA 132* 131* 133*  K 3.8 3.9 4.3  CL 97* 101 98  CO2 24 23 25   GLUCOSE 116* 109* 110*  BUN 26* 21 18  CREATININE 0.73 0.55 0.64  CALCIUM  9.1 9.0 9.2  GFRNONAA >60 >60 >60  ANIONGAP 11 7 10     Recent Labs  Lab 02/05/23 1826 02/07/23 0602   PROT 7.0 6.0*  ALBUMIN 3.4* 2.8*  AST 46* 34  ALT 26 22  ALKPHOS 67 57  BILITOT 1.3* 0.9   Lipids No results for input(s): CHOL, TRIG, HDL, LABVLDL, LDLCALC, CHOLHDL in the last 168 hours.  Hematology Recent Labs  Lab 02/06/23 0544 02/07/23 0602 02/08/23 0917  WBC 8.7 8.1 9.2  RBC 3.28* 3.47* 3.42*  HGB 10.6* 11.1* 11.2*  HCT 32.5* 34.2* 33.6*  MCV 99.1 98.6 98.2  MCH 32.3 32.0 32.7  MCHC 32.6 32.5 33.3  RDW 13.5 13.5 13.5  PLT 155 173 188   Thyroid No results for input(s): TSH, FREET4 in the last 168 hours.  BNP Recent Labs  Lab 02/05/23 1826 02/07/23 0602  BNP 203.6* 415.5*    DDimer No results for input(s): DDIMER in the last 168 hours.   Radiology/Studies:  ECHOCARDIOGRAM COMPLETE Result Date: 02/06/2023    ECHOCARDIOGRAM REPORT   Patient Name:   Mary Knapp Date of Exam: 02/06/2023 Medical Rec #:  [id number]     Height:       61.0 in Accession #:    [id number]    Weight:       125.0 lb Date of Birth:  [birth year]     BSA:          1.547 m Patient Age:    89 years      BP:           130/59 mmHg Patient Gender: F             HR:  68 bpm. Exam Location:  Inpatient Procedure: 2D Echo, Color Doppler and Cardiac Doppler Indications:    I48.91* Unspeicified atrial fibrillation  History:        Patient has no prior history of Echocardiogram examinations.                 CAD, Arrythmias:Atrial Fibrillation and Atrial Flutter; Risk                 Factors:Hypertension.  Sonographer:    Lanell Maduro Referring Phys: [id number] CLAUDIA CLAIBORNE IMPRESSIONS  1. Left ventricular ejection fraction, by estimation, is 70 to 75%. Left ventricular ejection fraction by 2D MOD biplane is 71.0 %. The left ventricle has hyperdynamic function. The left ventricle has no regional wall motion abnormalities. There is moderate asymmetric left ventricular hypertrophy of the basal-septal segment. Left ventricular diastolic function could not be evaluated.  2. Right ventricular  systolic function is low normal. The right ventricular size is normal. There is moderately elevated pulmonary artery systolic pressure. The estimated right ventricular systolic pressure is 59.4 mmHg.  3. Left atrial size was severely dilated.  4. Right atrial size was severely dilated.  5. The mitral valve is abnormal. Mild to moderate mitral valve regurgitation.  6. Leaflet thickening is noted. The tricuspid valve is abnormal. Tricuspid valve regurgitation is moderate to severe.  7. The aortic valve is tricuspid. Aortic valve regurgitation is trivial. Aortic valve sclerosis/calcification is present, without any evidence of aortic stenosis. Aortic regurgitation PHT measures 484 msec.  8. The inferior vena cava is dilated in size with <50% respiratory variability, suggesting right atrial pressure of 15 mmHg. Comparison(s): No prior Echocardiogram. FINDINGS  Left Ventricle: Left ventricular ejection fraction, by estimation, is 70 to 75%. Left ventricular ejection fraction by 2D MOD biplane is 71.0 %. The left ventricle has hyperdynamic function. The left ventricle has no regional wall motion abnormalities. The left ventricular internal cavity size was normal in size. There is moderate asymmetric left ventricular hypertrophy of the basal-septal segment. Left ventricular diastolic function could not be evaluated due to atrial fibrillation. Left ventricular diastolic function could not be evaluated. Right Ventricle: The right ventricular size is normal. No increase in right ventricular wall thickness. Right ventricular systolic function is low normal. There is moderately elevated pulmonary artery systolic pressure. The tricuspid regurgitant velocity  is 3.33 m/s, and with an assumed right atrial pressure of 15 mmHg, the estimated right ventricular systolic pressure is 59.4 mmHg. Left Atrium: Left atrial size was severely dilated. Right Atrium: Right atrial size was severely dilated. Pericardium: There is no evidence of  pericardial effusion. Mitral Valve: The mitral valve is abnormal. There is mild thickening of the anterior and posterior mitral valve leaflet(s). There is mild calcification of the anterior and posterior mitral valve leaflet(s). Mild to moderate mitral valve regurgitation, with centrally-directed jet. Tricuspid Valve: Leaflet thickening is noted. The tricuspid valve is abnormal. Tricuspid valve regurgitation is moderate to severe. Aortic Valve: The aortic valve is tricuspid. Aortic valve regurgitation is trivial. Aortic regurgitation PHT measures 484 msec. Aortic valve sclerosis/calcification is present, without any evidence of aortic stenosis. Pulmonic Valve: The pulmonic valve was grossly normal. Pulmonic valve regurgitation is mild. Aorta: The aortic root and ascending aorta are structurally normal, with no evidence of dilitation. Venous: The inferior vena cava is dilated in size with less than 50% respiratory variability, suggesting right atrial pressure of 15 mmHg. IAS/Shunts: No atrial level shunt detected by color flow Doppler.  LEFT VENTRICLE PLAX 2D  Biplane EF (MOD) LVIDd:         4.10 cm         LV Biplane EF:   Left LVIDs:         2.20 cm                          ventricular LV PW:         0.90 cm                          ejection LV IVS:        1.20 cm                          fraction by LVOT diam:     1.80 cm                          2D MOD LV SV:         65                               biplane is LV SV Index:   42                               71.0 %. LVOT Area:     2.54 cm                                Diastology                                LV e' medial:    7.07 cm/s LV Volumes (MOD)               LV E/e' medial:  21.4 LV vol d, MOD    34.4 ml       LV e' lateral:   12.30 cm/s A2C:                           LV E/e' lateral: 12.3 LV vol d, MOD    44.1 ml A4C: LV vol s, MOD    9.9 ml A2C: LV vol s, MOD    12.3 ml A4C: LV SV MOD A2C:   24.5 ml LV SV MOD A4C:   44.1 ml LV SV  MOD BP:    28.3 ml RIGHT VENTRICLE             IVC RV Basal diam:  4.40 cm     IVC diam: 2.20 cm RV Mid diam:    2.50 cm RV S prime:     10.60 cm/s TAPSE (M-mode): 1.9 cm LEFT ATRIUM             Index        RIGHT ATRIUM           Index LA diam:        5.10 cm 3.30 cm/m   RA Area:     27.00 cm LA Vol (A2C):   75.4 ml 48.75 ml/m  RA Volume:   85.20 ml  55.08 ml/m LA Vol (A4C):   79.6  ml 51.46 ml/m LA Biplane Vol: 77.1 ml 49.85 ml/m  AORTIC VALVE             PULMONIC VALVE LVOT Vmax:   112.00 cm/s PR End Diast Vel: 1.40 msec LVOT Vmean:  79.600 cm/s LVOT VTI:    0.257 m AI PHT:      484 msec  AORTA Ao Root diam: 2.60 cm Ao Asc diam:  3.50 cm MITRAL VALVE                TRICUSPID VALVE MV Area (PHT): 3.48 cm     TR Peak grad:   44.4 mmHg MV Decel Time: 218 msec     TR Vmax:        333.00 cm/s MR Peak grad: 96.4 mmHg MR Mean grad: 66.0 mmHg     SHUNTS MR Vmax:      491.00 cm/s   Systemic VTI:  0.26 m MR Vmean:     381.0 cm/s    Systemic Diam: 1.80 cm MV E velocity: 151.00 cm/s Vinie Maxcy MD Electronically signed by Vinie Maxcy MD Signature Date/Time: 02/06/2023/11:09:26 AM    Final    CT Head Wo Contrast Result Date: 02/05/2023 CLINICAL DATA:  Head trauma fell out of chair EXAM: CT HEAD WITHOUT CONTRAST TECHNIQUE: Contiguous axial images were obtained from the base of the skull through the vertex without intravenous contrast. RADIATION DOSE REDUCTION: This exam was performed according to the departmental dose-optimization program which includes automated exposure control, adjustment of the mA and/or kV according to patient size and/or use of iterative reconstruction technique. COMPARISON:  None Available. FINDINGS: Brain: No acute territorial infarction, hemorrhage or intracranial mass. Moderate atrophy and chronic small vessel ischemic changes of the white matter. Prominent ventricles felt secondary to atrophy. Vascular: No hyperdense vessels.  Carotid vascular calcification Skull: Normal. Negative for  fracture or focal lesion. Sinuses/Orbits: No acute finding. Other: None IMPRESSION: 1. No CT evidence for acute intracranial abnormality. 2. Moderate atrophy and chronic small vessel ischemic changes of the white matter. Electronically Signed   By: Luke Bun M.D.   On: 02/05/2023 22:27   DG Chest Port 1 View Result Date: 02/05/2023 CLINICAL DATA:  Fall EXAM: PORTABLE CHEST 1 VIEW COMPARISON:  None Available. FINDINGS: Mild diffuse reticular interstitial opacity suspicious for underlying chronic lung disease. Possible ground-glass infiltrate at left base. Upper normal cardiac silhouette with aortic atherosclerosis. No pneumothorax IMPRESSION: Mild diffuse reticular interstitial opacity suspicious for underlying chronic lung disease. Possible ground-glass infiltrate at left base. Electronically Signed   By: Luke Bun M.D.   On: 02/05/2023 21:21   DG Pelvis 1-2 Views Result Date: 02/05/2023 CLINICAL DATA:  Fall EXAM: PELVIS - 1-2 VIEW COMPARISON:  CT 05/02/2019 FINDINGS: SI joints are non widened. Pubic symphysis and rami appear intact. Interval severe arthritis of the left hip with bone on bone appearance and subarticular sclerosis. Sclerosis and lucency in the left femoral head with deformity, suspicious for AVN and partial collapse. IMPRESSION: 1. No acute osseous abnormality. 2. Interval severe arthritis of the left hip with findings suspicious for AVN and partial collapse of the left femoral head. Electronically Signed   By: Luke Bun M.D.   On: 02/05/2023 21:20   DG Lumbar Spine 2-3 Views Result Date: 02/05/2023 CLINICAL DATA:  Fall EXAM: LUMBAR SPINE - 2-3 VIEW COMPARISON:  None Available. FINDINGS: Trace anterolisthesis L2 on L3, L3 on L4 and L4 on L5. Vertebral body heights are maintained. Moderate severe diffuse degenerative changes with multilevel  disc space narrowing and osteophyte. Multiple level facet degenerative change. Aortic atherosclerosis IMPRESSION: Moderate to severe diffuse  degenerative changes. Electronically Signed   By: Luke Bun M.D.   On: 02/05/2023 21:13   ASSESSMENT   LE edema   The pt's edema is improved from report of admit   She did get 1 dose of IV lasix    I/O are incomplete though   Pt says urinating  REview of echo from 02/06/23, pt has severe TR with dilated IVC   This will continue to be a problem leading to more symptoms of R sided CHF   (edema, hepatic congestion)  TR was reportedly signficant even back in echo report from 2014       Pullmonary pressures in 2014 were mildly increased   Now PAP estimate is in 60s.   This may be contributing to more TR but I do not think it was the cause     Overall her  RV function is pretty good for now but with time will probably deteriorate further  In addition, her albumin is low at 2.8, further contrib to trends for edema   I agree with lasix    COuld go home on low dose 20 mg with close follow up of renal function   She does not have chronic skin changes, sugg edema has not been long term Limit sodium  WIll follow in clinic   2  Atrial fibrillation    Continue with 4ate control  and anticoagulation  3  Mitral regurgitation   Moderate on echo     4  PAD   Ht with PAD of tib/peroneal arteries   No claudication  5  LIpids Reportedly on Crestor  10 as outpt  Confirm   6  Ortho   Note AVN of hip    PT with no active angina    LVEF and RVEF normal on echo   Rate controlled afib     Feel pt is at acceptable risk if surgery planned to proceed without further testing         :[id number]}    Will sign off Please call with questions    Signed, Vina Gull, MD  02/09/2023 6:56 AM

## 2023-02-09 NOTE — Progress Notes (Signed)
 PROGRESS NOTE  Mary Knapp  FMW:[id number] DOB: [birth year] DOA: 02/05/2023 PCP: Signa Rush, MD (Inactive)  Consultants  Brief Narrative: Mary Knapp is a 89 y.o. evidently fairly functional female at baseline with medical history significant for atrial fibrillation on Xarelto , hypertension, hyperlipidemia, carotid artery disease, degenerative scoliosis, and peripheral arterial disease presents who presented with BL leg pain and increasing falls at home, though denied hitting her head.  In ED, CT head negative for bleed.  Found to have concern for AVN of femoral head.  Has never been hospitalized previously.  Incidentally a UTI was also discovered with positive nitrates and leukocyte esterase.  Has Tylenol  scheduled for pain relief.  Orthopedics saw patient and recommended complete hip replacement.  Family unsure about neck steps.   Assessment and Plan: AVN of hip: -Left sided with femoral head collapse. -Appreciate orthopedic consultation.  Recommends complex total hip replacement.   -Family and patient unsure if she would like to proceed with this. Cardiology saw patient this morning ordered acceptable risk if surgery is planned without further  -Weightbearing as tolerated left lower extremity with walker and two-person assist.   -PT and OT to help with mobilization.  OT has seen and recommended acute OT services. -Scheduled Tylenol  for pain relief.  Family asks for nothing stronger to ensure she does not become delirious with narcotic pain medication.  She is evidently opioid nave.  Pain has been fairly well-controlled.  2.  Cards: -Seen by cardiology today, appreciate input.   - Severe tricuspid severe tricuspid regurgitation with dilated IVC that will continue to cause right-sided CHF issues such as edema and hepatic congestion.   -Can continue Lasix .  Will follow in clinic. -With known A-fib.  She is on Xarelto  and rate controlled without being on any oral rate control  medications   3.  Painful bilateral lower extremity edema:  - reason for presentation.  -Almost sounds to be some degree of peripheral neuropathy.  On review of her records she has had this going back to at least 2023 and she had negative workup by vascular surgery for concern for PAD. -There is combination of lower extremity foot pain when trying to stand as well as this new or hip pain when she tries to stand but they seem to be 2 completely separate problems. - checking B12--> within normal limits   4.  Atrial fibrillation - Continue Xarelto .  Her beta-blocker had been held on admission but she has remained hypertensive while in house.  Will restart her home beta-blocker and await cardiology input. -No hypotension   5.  Hyponatremia: -Mild and asymptomatic. -Will continue to monitor.   6. Normocytic anemia: -Age-related? -No history of renal disease. -Denies any bleeding.  - CBC in AM       DVT prophylaxis:   Rivaroxaban  (XARELTO ) tablet 15 mg  Code Status:   Code Status: Limited: Do not attempt resuscitation (DNR) -DNR-LIMITED -Do Not Intubate/DNI  Family Communication: Spoke with patient's daughter, both sons, granddaughter, son-in-law.  Son-in-law and granddaughter are hospitalist. Level of care: Med-Surg Status is: Inpatient Discussed with family plan as well as orthopedics and cardiology input. Remains inpatient appropriate because: Requiring ongoing therapies.  Moving towards discharge.  Awaiting physical therapy input and recommendations.   Consults called: Orthopedics and cardiology  Subjective: Mary Knapp feels a little bit better today.  She is eating and drinking well although complaining about the food.  Pain better today than yesterday.  Objective: Vitals:   02/08/23 1750 02/08/23 2140  02/09/23 0452 02/09/23 0914  BP: (!) 135/59 129/76 125/61 119/78  Pulse: 81 73 66 95  Resp:  16 18   Temp:  98 F (36.7 C) 98.7 F (37.1 C)   TempSrc:  Oral Oral    SpO2:  93% 100%   Weight:      Height:        Intake/Output Summary (Last 24 hours) at 02/09/2023 1433 Last data filed at 02/09/2023 1400 Gross per 24 hour  Intake 777 ml  Output 300 ml  Net 477 ml   Filed Weights   02/05/23 1824  Weight: 56.7 kg   Body mass index is 23.62 kg/m.  Gen: 89 y.o. female in no apparent distress.  Nontoxic Pulm: Non-labored breathing.  Clear to auscultation bilaterally.  CV: Regular rate and rhythm. No murmur, rub, or gallop. No JVD GI: Abdomen soft, non-tender, non-distended, with normoactive bowel sounds. No organomegaly or masses felt. Ext: Warm, no deformities, resolving BL LE edema.  Still trace present MSK: Has notable bruise buttock/right gluteal region.  Tender to palpation here. Skin: No rashes, lesions Neuro: Alert and oriented. No focal neurological deficits. Psych: Calm  Judgement and insight appear normal. Mood & affect appropriate.     I have personally reviewed the following labs and images: CBC: Recent Labs  Lab 02/05/23 1826 02/06/23 0544 02/07/23 0602 02/08/23 0917  WBC 9.9 8.7 8.1 9.2  HGB 12.8 10.6* 11.1* 11.2*  HCT 38.7 32.5* 34.2* 33.6*  MCV 98.2 99.1 98.6 98.2  PLT 173 155 173 188   BMP &GFR Recent Labs  Lab 02/05/23 1826 02/06/23 0544 02/07/23 0602 02/08/23 0917  NA 132* 132* 131* 133*  K 4.2 3.8 3.9 4.3  CL 96* 97* 101 98  CO2 26 24 23 25   GLUCOSE 113* 116* 109* 110*  BUN 27* 26* 21 18  CREATININE 0.68 0.73 0.55 0.64  CALCIUM  9.7 9.1 9.0 9.2   Estimated Creatinine Clearance: 32.4 mL/min (by C-G formula based on SCr of 0.64 mg/dL). Liver & Pancreas: Recent Labs  Lab 02/05/23 1826 02/07/23 0602  AST 46* 34  ALT 26 22  ALKPHOS 67 57  BILITOT 1.3* 0.9  PROT 7.0 6.0*  ALBUMIN 3.4* 2.8*   No results for input(s): LIPASE, AMYLASE in the last 168 hours. No results for input(s): AMMONIA in the last 168 hours. Diabetic: No results for input(s): HGBA1C in the last 72 hours. Recent Labs  Lab  02/05/23 1826  GLUCAP 96   Cardiac Enzymes: Recent Labs  Lab 02/05/23 1826  CKTOTAL 515*   No results for input(s): PROBNP in the last 8760 hours. Coagulation Profile: No results for input(s): INR, PROTIME in the last 168 hours. Thyroid Function Tests: No results for input(s): TSH, T4TOTAL, FREET4, T3FREE, THYROIDAB in the last 72 hours. Lipid Profile: No results for input(s): CHOL, HDL, LDLCALC, TRIG, CHOLHDL, LDLDIRECT in the last 72 hours. Anemia Panel: Recent Labs    02/08/23 0917  VITAMINB12 680  FOLATE 20.7   Urine analysis:    Component Value Date/Time   COLORURINE YELLOW 02/05/2023 2100   APPEARANCEUR HAZY (A) 02/05/2023 2100   LABSPEC 1.020 02/05/2023 2100   PHURINE 6.0 02/05/2023 2100   GLUCOSEU NEGATIVE 02/05/2023 2100   HGBUR NEGATIVE 02/05/2023 2100   BILIRUBINUR NEGATIVE 02/05/2023 2100   KETONESUR NEGATIVE 02/05/2023 2100   PROTEINUR 30 (A) 02/05/2023 2100   NITRITE POSITIVE (A) 02/05/2023 2100   LEUKOCYTESUR LARGE (A) 02/05/2023 2100   Sepsis Labs: Invalid input(s): PROCALCITONIN, LACTICIDVEN  Microbiology: Recent  Results (from the past 240 hours)  Urine Culture     Status: Abnormal   Collection Time: 02/05/23  9:00 PM   Specimen: Urine, Clean Catch  Result Value Ref Range Status   Specimen Description   Final    URINE, CLEAN CATCH Performed at [facility], [address], [place], [place] [address]    Special Requests   Final    NONE Performed at [facility], [address], [place], [place] [address]    Culture >=100,000 COLONIES/mL ESCHERICHIA COLI (A)  Final   Report Status 02/08/2023 FINAL  Final   Organism ID, Bacteria ESCHERICHIA COLI (A)  Final      Susceptibility   Escherichia coli - MIC*    AMPICILLIN <=2 SENSITIVE Sensitive     CEFAZOLIN  <=4 SENSITIVE Sensitive     CEFEPIME <=0.12 SENSITIVE Sensitive     CEFTRIAXONE  <=0.25 SENSITIVE Sensitive      CIPROFLOXACIN <=0.25 SENSITIVE Sensitive     GENTAMICIN <=1 SENSITIVE Sensitive     IMIPENEM <=0.25 SENSITIVE Sensitive     NITROFURANTOIN <=16 SENSITIVE Sensitive     TRIMETH/SULFA <=20 SENSITIVE Sensitive     AMPICILLIN/SULBACTAM <=2 SENSITIVE Sensitive     PIP/TAZO <=4 SENSITIVE Sensitive ug/mL    * >=100,000 COLONIES/mL ESCHERICHIA COLI    Radiology Studies: No results found.  Scheduled Meds:  acetaminophen   500 mg Oral Q6H   feeding supplement  237 mL Oral BID BM   ketoconazole   1 Application Topical Once per day on Monday Thursday   metoprolol  succinate  25 mg Oral Daily   Rivaroxaban   15 mg Oral Daily   Continuous Infusions:   ceFAZolin  (ANCEF ) IV 2 g (02/09/23 1256)     LOS: 4 days   35 minutes with more than 50% spent in reviewing records, counseling patient/family and coordinating care.  Reyes VEAR Gaw, MD [facility] [email or web address] 02/09/2023, 2:33 PM

## 2023-02-09 NOTE — Plan of Care (Signed)
  Problem: Education: Goal: Knowledge of General Education information will improve Description: Including pain rating scale, medication(s)/side effects and non-pharmacologic comfort measures Outcome: Progressing   Problem: Health Behavior/Discharge Planning: Goal: Ability to manage health-related needs will improve Outcome: Progressing   Problem: Clinical Measurements: Goal: Ability to maintain clinical measurements within normal limits will improve Outcome: Progressing Goal: Will remain free from infection Outcome: Progressing Goal: Diagnostic test results will improve Outcome: Progressing   Problem: Activity: Goal: Risk for activity intolerance will decrease Outcome: Progressing   Problem: Elimination: Goal: Will not experience complications related to bowel motility Outcome: Progressing Goal: Will not experience complications related to urinary retention Outcome: Progressing   Problem: Pain Management: Goal: General experience of comfort will improve Outcome: Progressing   Problem: Safety: Goal: Ability to remain free from injury will improve Outcome: Progressing   Problem: Skin Integrity: Goal: Risk for impaired skin integrity will decrease Outcome: Progressing   Problem: Education: Goal: Knowledge of General Education information will improve Description: Including pain rating scale, medication(s)/side effects and non-pharmacologic comfort measures Outcome: Progressing   Problem: Health Behavior/Discharge Planning: Goal: Ability to manage health-related needs will improve Outcome: Progressing   Problem: Clinical Measurements: Goal: Ability to maintain clinical measurements within normal limits will improve Outcome: Progressing Goal: Will remain free from infection Outcome: Progressing Goal: Diagnostic test results will improve Outcome: Progressing   Problem: Activity: Goal: Risk for activity intolerance will decrease Outcome: Progressing   Problem:  Elimination: Goal: Will not experience complications related to bowel motility Outcome: Progressing Goal: Will not experience complications related to urinary retention Outcome: Progressing   Problem: Pain Management: Goal: General experience of comfort will improve Outcome: Progressing   Problem: Safety: Goal: Ability to remain free from injury will improve Outcome: Progressing   Problem: Skin Integrity: Goal: Risk for impaired skin integrity will decrease Outcome: Progressing

## 2023-02-10 DIAGNOSIS — N390 Urinary tract infection, site not specified: Secondary | ICD-10-CM | POA: Diagnosis not present

## 2023-02-10 MED ORDER — OXYCODONE HCL 5 MG PO TABS
2.5000 mg | ORAL_TABLET | Freq: Once | ORAL | Status: AC | PRN
  Administered 2023-02-10: 2.5 mg via ORAL
  Filled 2023-02-10: qty 1

## 2023-02-10 MED ORDER — CEFADROXIL 500 MG PO CAPS
500.0000 mg | ORAL_CAPSULE | Freq: Two times a day (BID) | ORAL | Status: AC
  Administered 2023-02-10 – 2023-02-12 (×6): 500 mg via ORAL
  Filled 2023-02-10 (×6): qty 1

## 2023-02-10 NOTE — TOC Initial Note (Signed)
 Transition of Care [facility]) - Initial/Assessment Note    Patient Details  Name: Mary Knapp MRN: [id number] Date of Birth: [birth year]  [facility]) CM/SW Contact:    Toy LITTIE Agar, RN Phone [phone]  02/10/2023, 3:59 PM  Clinical Narrative:                 [facility] acknowledges consult for patient with SNF recommendation.Patient is from home where she normally functions independently.  Daughter currently at bedside. CM has explained current recommendation and provided patient and daughter with medicare.gov list of SNF facilities for short term rehab. Patient is agreeable to SNF workup. PASRR # [id number] A Fl2 completed and faxed out for bed offers. Daughter expressed that definite plan is not clear at this time family to discuss.  TOC will continue to follow.   Expected Discharge Plan: Skilled Nursing Facility Barriers to Discharge: Continued Medical Work up   Patient Goals and CMS Choice Patient states their goals for this hospitalization and ongoing recovery are:: Wants to get stronger to be able to go home CMS Medicare.gov Compare Post Acute Care list provided to:: Patient Represenative (must comment) (daughter at bedside) Choice offered to / list presented to : Patient, Adult Children [place] ownership interest in [facility].provided to:: Patient    Expected Discharge Plan and Services In-house Referral: NA Discharge Planning Services: CM Consult Post Acute Care Choice: Skilled Nursing Facility Living arrangements for the past 2 months: Single Family Home                 DME Arranged: N/A DME Agency: NA       HH Arranged: NA HH Agency: NA        Prior Living Arrangements/Services Living arrangements for the past 2 months: Single Family Home Lives with:: Self Patient language and need for interpreter reviewed:: Yes Do you feel safe going back to the place where you live?: No   Not unitl she builds strength up  Need for Family  Participation in Patient Care: Yes (Comment) Care giver support system in place?: Yes (comment) Current home services:  (n/a) Criminal Activity/Legal Involvement Pertinent to Current Situation/Hospitalization: No - Comment as needed  Activities of Daily Living   ADL Screening (condition at time of admission) Independently performs ADLs?: No Does the patient have a NEW difficulty with bathing/dressing/toileting/self-feeding that is expected to last >3 days?: Yes (Initiates electronic notice to provider for possible OT consult) Does the patient have a NEW difficulty with getting in/out of bed, walking, or climbing stairs that is expected to last >3 days?: Yes (Initiates electronic notice to provider for possible PT consult) Does the patient have a NEW difficulty with communication that is expected to last >3 days?: No Is the patient deaf or have difficulty hearing?: Yes Does the patient have difficulty seeing, even when wearing glasses/contacts?: No Does the patient have difficulty concentrating, remembering, or making decisions?: No  Permission Sought/Granted Permission sought to share information with : Facility Industrial/product Designer granted to share information with : Yes, Verbal Permission Granted  Share Information with NAME: Darice Mend [phone]     Permission granted to share info w Relationship: daughter  Permission granted to share info w Contact Information: [phone]  Emotional Assessment Appearance:: Appears older than stated age Attitude/Demeanor/Rapport: Gracious Affect (typically observed): Pleasant, Quiet Orientation: : Oriented to Self, Oriented to Place, Oriented to  Time, Oriented to Situation Alcohol / Substance Use: Not Applicable Psych Involvement: No (comment)  Admission diagnosis:  Dehydration [  E86.0] UTI (urinary tract infection) [N39.0] Acute cystitis without hematuria [N30.00] Fall, initial encounter [W19.XXXA] Patient Active Problem List    Diagnosis Date Noted   Tricuspid valve insufficiency 02/09/2023   Avascular necrosis of bone of left hip (HCC) 02/07/2023   Chronic cough 02/06/2023   Gait abnormality 02/06/2023   Hearing loss 02/06/2023   Insomnia 02/06/2023   Falls 02/06/2023   Chronic anticoagulation 02/06/2023   UTI (urinary tract infection) 02/05/2023   Persistent atrial fibrillation (HCC) 12/30/2021   Carotid stenosis, right 12/30/2021   Essential hypertension 12/30/2021   Dyslipidemia 12/30/2021   Hematuria 05/02/2019   PAF (paroxysmal atrial fibrillation) (HCC) 01/08/2013   Pure hypercholesterolemia 01/08/2013   Benign hypertensive heart disease without heart failure 11/30/2012   Atrial flutter (HCC) 11/23/2012   Vascular disease    Carotid artery disease (HCC)    PCP:  Signa Rush, MD (Inactive) Pharmacy:   [facility] [place], [place] - [address] [place] [address] Phone: [phone] Fax: [phone]     Social Drivers of Health (SDOH) Social History: SDOH Screenings   Food Insecurity: No Food Insecurity (02/06/2023)  Housing: Low Risk  (02/06/2023)  Transportation Needs: No Transportation Needs (02/06/2023)  Utilities: Not At Risk (02/06/2023)  Social Connections: Moderately Integrated (02/06/2023)  Tobacco Use: Medium Risk (02/05/2023)   SDOH Interventions:     Readmission Risk Interventions     No data to display

## 2023-02-10 NOTE — Progress Notes (Signed)
 PROGRESS NOTE  Mary Knapp  FMW:[id number] DOB: [birth year] DOA: 02/05/2023 PCP: Signa Rush, MD (Inactive)  Consultants  Brief Narrative: METTA KORANDA is a 89 y.o. evidently fairly functional female at baseline with medical history significant for atrial fibrillation on Xarelto , hypertension, hyperlipidemia, carotid artery disease, degenerative scoliosis, and peripheral arterial disease presents who presented with BL leg pain and increasing falls at home, though denied hitting her head.  In ED, CT head negative for bleed.  Found to have concern for AVN of pelvis and collapse of femoral head.  Has never been hospitalized previously.  Incidentally a UTI was also discovered with positive nitrates and leukocyte esterase.  Has Tylenol  scheduled for pain relief.  Orthopedics saw patient and recommended complete hip replacement.    Assessment and Plan: AVN of hip: -Left sided with femoral head collapse. -Appreciate orthopedic consultation.  Recommends complex total hip replacement.   - APAP for pain relief which has been thus far controlling her pain. -She has been working with physical and Occupational Therapy. -Written for her again to be out of bed to her chair today. -Consulted inpatient rehab.  Determination was not a candidate for inpatient rehab but rather for skilled nursing facility placement.  Social work has reached out to start this process.  2.  Cards: - Severe tricuspid severe tricuspid regurgitation with dilated IVC that will continue to cause right-sided CHF issues such as edema and hepatic congestion.   -Can continue Lasix .  Cardiology will follow in clinic -With known A-fib.  She is on Xarelto  and rate controlled without being on any oral rate control medications -Cards has deemed if she goes to surgery to be an acceptable risk without need for any further imaging studies.   3.  Painful bilateral lower extremity edema:  - reason for presentation.  - checking B12--> within  normal limits   4.  Atrial fibrillation - Continue Xarelto .  Her beta-blocker had been held on admission but she has remained hypertensive while in house.  Will restart her home beta-blocker and await cardiology input. -No hypotension   5.  Hyponatremia: -Mild and asymptomatic. -Will continue to monitor.   6. Normocytic anemia: -No history of renal disease. -Denies any bleeding.        DVT prophylaxis:  Rivaroxaban  (XARELTO ) tablet 15 mg  Code Status:   Code Status: Limited: Do not attempt resuscitation (DNR) -DNR-LIMITED -Do Not Intubate/DNI  Family Communication: Spoke with patient's daughter by phone today and 1 son who was in the room when I visited. Level of care: Med-Surg Status is: Inpatient Remains inpatient appropriate because: Requiring ongoing therapies.  Moving towards discharge.  Awaiting physical therapy input and recommendations. Disposition: Moving towards skilled nursing facility placement.  Orthopedics has recommended outpatient surgery but family is not sure if that is what they want to proceed with.   Consults called: Orthopedics and cardiology  Subjective: Mary Knapp feels a little bit better today.  Continues to be lucid.  No nausea.  Able to straighten her legs today which she has been nervous to do.  Pain better today than yesterday.  Objective: Vitals:   02/09/23 1938 02/10/23 0605 02/10/23 1110 02/10/23 1348  BP: (!) 151/77  132/63 138/69  Pulse: 75 83 75 80  Resp: 18   18  Temp: 98.3 F (36.8 C)   98.1 F (36.7 C)  TempSrc: Oral   Oral  SpO2: 93% 90%  90%  Weight:      Height:  Intake/Output Summary (Last 24 hours) at 02/10/2023 1701 Last data filed at 02/10/2023 1429 Gross per 24 hour  Intake 815.12 ml  Output --  Net 815.12 ml   Filed Weights   02/05/23 1824  Weight: 56.7 kg   Body mass index is 23.62 kg/m.  Gen: 89 y.o. female in no apparent distress.  Nontoxic Pulm: Non-labored breathing.  Clear to auscultation  bilaterally.  CV: Regular rate and rhythm. No murmur, rub, or gallop. No JVD GI: Abdomen soft, non-tender, non-distended, with normoactive bowel sounds. No organomegaly or masses felt. Ext: Warm, no deformities, resolving BL LE edema.   MSK: Has notable bruise buttock/right gluteal region.  Tender to palpation here. Skin: No rashes, lesions Neuro: Alert and oriented. No focal neurological deficits. Psych: Calm  Judgement and insight appear normal. Mood & affect appropriate.     I have personally reviewed the following labs and images: CBC: Recent Labs  Lab 02/05/23 1826 02/06/23 0544 02/07/23 0602 02/08/23 0917  WBC 9.9 8.7 8.1 9.2  HGB 12.8 10.6* 11.1* 11.2*  HCT 38.7 32.5* 34.2* 33.6*  MCV 98.2 99.1 98.6 98.2  PLT 173 155 173 188   BMP &GFR Recent Labs  Lab 02/05/23 1826 02/06/23 0544 02/07/23 0602 02/08/23 0917  NA 132* 132* 131* 133*  K 4.2 3.8 3.9 4.3  CL 96* 97* 101 98  CO2 26 24 23 25   GLUCOSE 113* 116* 109* 110*  BUN 27* 26* 21 18  CREATININE 0.68 0.73 0.55 0.64  CALCIUM  9.7 9.1 9.0 9.2   Estimated Creatinine Clearance: 32.4 mL/min (by C-G formula based on SCr of 0.64 mg/dL). Liver & Pancreas: Recent Labs  Lab 02/05/23 1826 02/07/23 0602  AST 46* 34  ALT 26 22  ALKPHOS 67 57  BILITOT 1.3* 0.9  PROT 7.0 6.0*  ALBUMIN 3.4* 2.8*   No results for input(s): LIPASE, AMYLASE in the last 168 hours. No results for input(s): AMMONIA in the last 168 hours. Diabetic: No results for input(s): HGBA1C in the last 72 hours. Recent Labs  Lab 02/05/23 1826  GLUCAP 96   Cardiac Enzymes: Recent Labs  Lab 02/05/23 1826  CKTOTAL 515*   No results for input(s): PROBNP in the last 8760 hours. Coagulation Profile: No results for input(s): INR, PROTIME in the last 168 hours. Thyroid Function Tests: No results for input(s): TSH, T4TOTAL, FREET4, T3FREE, THYROIDAB in the last 72 hours. Lipid Profile: No results for input(s): CHOL, HDL,  LDLCALC, TRIG, CHOLHDL, LDLDIRECT in the last 72 hours. Anemia Panel: Recent Labs    02/08/23 0917  VITAMINB12 680  FOLATE 20.7   Urine analysis:    Component Value Date/Time   COLORURINE YELLOW 02/05/2023 2100   APPEARANCEUR HAZY (A) 02/05/2023 2100   LABSPEC 1.020 02/05/2023 2100   PHURINE 6.0 02/05/2023 2100   GLUCOSEU NEGATIVE 02/05/2023 2100   HGBUR NEGATIVE 02/05/2023 2100   BILIRUBINUR NEGATIVE 02/05/2023 2100   KETONESUR NEGATIVE 02/05/2023 2100   PROTEINUR 30 (A) 02/05/2023 2100   NITRITE POSITIVE (A) 02/05/2023 2100   LEUKOCYTESUR LARGE (A) 02/05/2023 2100   Sepsis Labs: Invalid input(s): PROCALCITONIN, LACTICIDVEN  Microbiology: Recent Results (from the past 240 hours)  Urine Culture     Status: Abnormal   Collection Time: 02/05/23  9:00 PM   Specimen: Urine, Clean Catch  Result Value Ref Range Status   Specimen Description   Final    URINE, CLEAN CATCH Performed at [facility], [address], [place], [place] [address]    Special  Requests   Final    NONE Performed at [facility], [address] [place], [place] [address]    Culture >=100,000 COLONIES/mL ESCHERICHIA COLI (A)  Final   Report Status 02/08/2023 FINAL  Final   Organism ID, Bacteria ESCHERICHIA COLI (A)  Final      Susceptibility   Escherichia coli - MIC*    AMPICILLIN <=2 SENSITIVE Sensitive     CEFAZOLIN  <=4 SENSITIVE Sensitive     CEFEPIME <=0.12 SENSITIVE Sensitive     CEFTRIAXONE  <=0.25 SENSITIVE Sensitive     CIPROFLOXACIN <=0.25 SENSITIVE Sensitive     GENTAMICIN <=1 SENSITIVE Sensitive     IMIPENEM <=0.25 SENSITIVE Sensitive     NITROFURANTOIN <=16 SENSITIVE Sensitive     TRIMETH/SULFA <=20 SENSITIVE Sensitive     AMPICILLIN/SULBACTAM <=2 SENSITIVE Sensitive     PIP/TAZO <=4 SENSITIVE Sensitive ug/mL    * >=100,000 COLONIES/mL ESCHERICHIA COLI    Radiology Studies: No results found.  Scheduled Meds:  acetaminophen   500  mg Oral Q6H   cefadroxil   500 mg Oral BID   feeding supplement  237 mL Oral BID BM   furosemide   40 mg Intravenous Daily   ketoconazole   1 Application Topical Once per day on Monday Thursday   metoprolol  succinate  25 mg Oral Daily   Rivaroxaban   15 mg Oral Daily   Continuous Infusions:     LOS: 5 days   35 minutes with more than 50% spent in reviewing records, counseling patient/family and coordinating care.  Reyes VEAR Gaw, MD [facility] [email or web address] 02/10/2023, 5:01 PM

## 2023-02-10 NOTE — [place] FL2 (Signed)
 [place]  MEDICAID FL2 LEVEL OF CARE FORM     IDENTIFICATION  Patient Name: Mary Knapp Birthdate: July 25, 1926 Sex: female Admission Date (Current Location): 02/05/2023  [facility] and [place] Number:  Producer, Television/film/video and Address:  [facility],  [address] [place], [place] [address]      Provider Number: [id number]  Attending Physician Name and Address:  Elpidio Reyes DEL, MD  Relative Name and Phone Number:  Darice Mend [phone]    Current Level of Care: Hospital Recommended Level of Care: Skilled Nursing Facility Prior Approval Number:    Date Approved/Denied:   PASRR Number: [id number]  Discharge Plan: SNF    Current Diagnoses: Patient Active Problem List   Diagnosis Date Noted   Tricuspid valve insufficiency 02/09/2023   Avascular necrosis of bone of left hip (HCC) 02/07/2023   Chronic cough 02/06/2023   Gait abnormality 02/06/2023   Hearing loss 02/06/2023   Insomnia 02/06/2023   Falls 02/06/2023   Chronic anticoagulation 02/06/2023   UTI (urinary tract infection) 02/05/2023   Persistent atrial fibrillation (HCC) 12/30/2021   Carotid stenosis, right 12/30/2021   Essential hypertension 12/30/2021   Dyslipidemia 12/30/2021   Hematuria 05/02/2019   PAF (paroxysmal atrial fibrillation) (HCC) 01/08/2013   Pure hypercholesterolemia 01/08/2013   Benign hypertensive heart disease without heart failure 11/30/2012   Atrial flutter (HCC) 11/23/2012   Vascular disease    Carotid artery disease (HCC)     Orientation RESPIRATION BLADDER Height & Weight     Self, Time, Situation, Place  Normal Incontinent, External catheter Weight: 56.7 kg Height:  5' 1 (154.9 cm)  BEHAVIORAL SYMPTOMS/MOOD NEUROLOGICAL BOWEL NUTRITION STATUS     (n/a) Continent Diet  AMBULATORY STATUS COMMUNICATION OF NEEDS Skin   Extensive Assist (moderate assist plus 2) Verbally Other (Comment) (dry flaky ecchomosis right thigh & coccyx)                       Personal  Care Assistance Level of Assistance  Bathing, Feeding, Dressing Bathing Assistance: Limited assistance Feeding assistance: Limited assistance Dressing Assistance: Limited assistance     Functional Limitations Info  Sight, Hearing, Speech Sight Info: Impaired Financial Trader) Hearing Info: Impaired (hard of hearing) Speech Info: Adequate    SPECIAL CARE FACTORS FREQUENCY  PT (By licensed PT), OT (By licensed OT)     PT Frequency: 5x/wk OT Frequency: 5x/wk            Contractures Contractures Info: Present    Additional Factors Info  Code Status, Allergies, Psychotropic, Insulin Sliding Scale, Isolation Precautions, Suctioning Needs Code Status Info: DNR Allergies Info: Rosuvastatin  Psychotropic Info: see discharge summary Insulin Sliding Scale Info: see discharge summary Isolation Precautions Info: see discharge summary Suctioning Needs: n/a   Current Medications (02/10/2023):  This is the current hospital active medication list Current Facility-Administered Medications  Medication Dose Route Frequency Provider Last Rate Last Admin   acetaminophen  (TYLENOL ) tablet 500 mg  500 mg Oral Q6H PRN Arthea Child, MD   500 mg at 02/07/23 0100   Or   acetaminophen  (TYLENOL ) suppository 650 mg  650 mg Rectal Q6H PRN Arthea Child, MD       acetaminophen  (TYLENOL ) tablet 500 mg  500 mg Oral Q6H Elpidio Reyes DEL, MD   500 mg at 02/10/23 1255   bisacodyl  (DULCOLAX) EC tablet 5 mg  5 mg Oral Daily PRN Arthea Child, MD       cefadroxil  (DURICEF) capsule 500 mg  500 mg Oral BID  Elpidio Reyes DEL, MD   500 mg at 02/10/23 1109   feeding supplement (ENSURE ENLIVE / ENSURE PLUS) liquid 237 mL  237 mL Oral BID BM Sira, Zackery, MD   237 mL at 02/10/23 1429   furosemide  (LASIX ) injection 40 mg  40 mg Intravenous Daily Walden, Jeffrey H, MD   40 mg at 02/10/23 1116   ketoconazole  (NIZORAL ) 2 % shampoo 1 Application  1 Application Topical Once per day on Monday Thursday Arthea Child, MD       melatonin tablet 3 mg  3 mg Oral QHS PRN Claiborne, Claudia, MD   3 mg at 02/09/23 2136   metoprolol  succinate (TOPROL -XL) 24 hr tablet 25 mg  25 mg Oral Daily Elpidio Reyes DEL, MD   25 mg at 02/10/23 1110   ondansetron  (ZOFRAN ) tablet 4 mg  4 mg Oral Q6H PRN Arthea Child, MD       Or   ondansetron  (ZOFRAN ) injection 4 mg  4 mg Intravenous Q6H PRN Arthea Child, MD       Rivaroxaban  (XARELTO ) tablet 15 mg  15 mg Oral Daily Claiborne, Claudia, MD   15 mg at 02/10/23 1110     Discharge Medications: Please see discharge summary for a list of discharge medications.  Relevant Imaging Results:  Relevant Lab Results:   Additional Information SS# [id number]  Toy LITTIE Agar, RN

## 2023-02-10 NOTE — Progress Notes (Signed)
 Inpatient Rehab Admissions Coordinator:  Consult received. Per chart review, PT/OT recommending SNF rehab. Also note, pt/family unsure if would like to proceed with total hip replacement. TOC made aware. Will sign off.   Tinnie Yvone Cohens, MS, CCC-SLP Admissions Coordinator [phone]

## 2023-02-11 DIAGNOSIS — M87052 Idiopathic aseptic necrosis of left femur: Secondary | ICD-10-CM | POA: Diagnosis not present

## 2023-02-11 NOTE — TOC Progression Note (Signed)
 Transition of Care [facility]) - Progression Note    Patient Details  Name: Mary Knapp MRN: [id number] Date of Birth: 04/02/1926  Transition of Care [facility]) CM/SW Contact  Toy LITTIE Agar, RN Phone Number:[phone]  02/11/2023, 11:51 AM  Clinical Narrative:    Daughter Darice at bedside. CM gave daughter list of facilities that have made bed offers. Daughter has questions about what will be best for her mother meaning home with care and hospice versus SNF. Daughter would like Hospice consult. CM has sent message to MD. [facility] continues to follow for disposition planning.    Expected Discharge Plan: Skilled Nursing Facility Barriers to Discharge: Continued Medical Work up  Expected Discharge Plan and Services In-house Referral: NA Discharge Planning Services: CM Consult Post Acute Care Choice: Skilled Nursing Facility Living arrangements for the past 2 months: Single Family Home                 DME Arranged: N/A DME Agency: NA       HH Arranged: NA HH Agency: NA         Social Determinants of Health (SDOH) Interventions SDOH Screenings   Food Insecurity: No Food Insecurity (02/06/2023)  Housing: Low Risk  (02/06/2023)  Transportation Needs: No Transportation Needs (02/06/2023)  Utilities: Not At Risk (02/06/2023)  Social Connections: Moderately Integrated (02/06/2023)  Tobacco Use: Medium Risk (02/05/2023)    Readmission Risk Interventions     No data to display

## 2023-02-11 NOTE — Progress Notes (Signed)
 PROGRESS NOTE  Mary Knapp  FMW:[id number] DOB: August 14, 1926 DOA: 02/05/2023 PCP: Signa Rush, MD (Inactive)  Consultants  Brief Narrative: CELLA CAPPELLO is a 89 y.o. functional female at baseline with medical history significant for atrial fibrillation on Xarelto , hypertension, hyperlipidemia, carotid artery disease, degenerative scoliosis, and peripheral arterial disease presents who presented with BL leg pain and increasing falls at home, though denied hitting her head.  In ED, CT head negative for bleed.  Found to have concern for AVN of pelvis and collapse of femoral head.  Has never been hospitalized previously.  Incidentally a UTI was also discovered with positive nitrates and leukocyte esterase.  Has Tylenol  scheduled for pain relief.  Orthopedics saw patient and recommended complete hip replacement.  Family is now interested palliative care   Assessment and Plan: AVN of hip: -Left sided with femoral head collapse. -Appreciate orthopedic consultation.  Recommends complex total hip replacement.   - APAP for pain relief which has been thus far controlling her pain. -Received 1 dose of oxycodone  last night -Family no more interested in palliative care.  Requesting consult today.  2.  Cards: - Severe tricuspid severe tricuspid regurgitation with dilated IVC that will continue to cause right-sided CHF issues such as edema and hepatic congestion.   -Can continue Lasix .  Cardiology will follow in clinic -With known A-fib.  She is on Xarelto  and rate controlled without being on any oral rate control medications -Cards has deemed if she goes to surgery to be an acceptable risk without need for any further imaging studies.   3.  Painful bilateral lower extremity edema:  - reason for presentation.  - checking B12--> within normal limits   4.  Atrial fibrillation - Continue Xarelto .  Her beta-blocker had been held on admission but she has remained hypertensive while in house.  Will restart  her home beta-blocker and await cardiology input. -No hypotension   5.  Hyponatremia: -Mild and asymptomatic. -Will continue to monitor.   6. Normocytic anemia: -No history of renal disease. -Denies any bleeding.        DVT prophylaxis:  Rivaroxaban  (XARELTO ) tablet 15 mg  Code Status:   Code Status: Limited: Do not attempt resuscitation (DNR) -DNR-LIMITED -Do Not Intubate/DNI  Family Communication: Spoke with patient's daughter at bedside today along with patient Level of care: Med-Surg Status is: Inpatient Disposition: Family now moving more towards comfort care/palliative care anticipate discharge in next 1 to 2 days.   Consults called: Orthopedics and cardiology, palliative care  Subjective: Mrs. Tagor was more sleepy this morning.  She did receive 1 dose of oxycodone  last night for unclear reasons.  Less hungry today than she has been.  Also less lucid because she was more sleepy.  No complaints or concerns  Objective: Vitals:   02/10/23 1938 02/10/23 2351 02/11/23 0655 02/11/23 1352  BP: (!) 151/77  137/75 123/61  Pulse: 75  90 73  Resp: 18  18   Temp: 98.3 F (36.8 C)  98.3 F (36.8 C) (!) 97.5 F (36.4 C)  TempSrc: Oral  Oral Oral  SpO2: 90% 90% (!) 87% 90%  Weight:      Height:       No intake or output data in the 24 hours ending 02/11/23 1519  Filed Weights   02/05/23 1824  Weight: 56.7 kg   Body mass index is 23.62 kg/m.  Gen: 89 y.o. female in no apparent distress.  Nontoxic Pulm: Non-labored breathing.  Clear to auscultation bilaterally.  CV:  Regular rate and rhythm. No murmur, rub, or gallop. No JVD GI: Abdomen soft, non-tender, non-distended, with normoactive bowel sounds. No organomegaly or masses felt. Ext: Warm, no deformities, resolving BL LE edema.   MSK: Has notable bruise buttock/right gluteal region.  Tender to palpation here. Skin: No rashes, lesions Neuro: Alert and oriented. No focal neurological deficits. Psych: Calm  Judgement  and insight appear normal. Mood & affect appropriate.     I have personally reviewed the following labs and images: CBC: Recent Labs  Lab 02/05/23 1826 02/06/23 0544 02/07/23 0602 02/08/23 0917  WBC 9.9 8.7 8.1 9.2  HGB 12.8 10.6* 11.1* 11.2*  HCT 38.7 32.5* 34.2* 33.6*  MCV 98.2 99.1 98.6 98.2  PLT 173 155 173 188   BMP &GFR Recent Labs  Lab 02/05/23 1826 02/06/23 0544 02/07/23 0602 02/08/23 0917  NA 132* 132* 131* 133*  K 4.2 3.8 3.9 4.3  CL 96* 97* 101 98  CO2 26 24 23 25   GLUCOSE 113* 116* 109* 110*  BUN 27* 26* 21 18  CREATININE 0.68 0.73 0.55 0.64  CALCIUM  9.7 9.1 9.0 9.2   Estimated Creatinine Clearance: 32.4 mL/min (by C-G formula based on SCr of 0.64 mg/dL). Liver & Pancreas: Recent Labs  Lab 02/05/23 1826 02/07/23 0602  AST 46* 34  ALT 26 22  ALKPHOS 67 57  BILITOT 1.3* 0.9  PROT 7.0 6.0*  ALBUMIN 3.4* 2.8*   No results for input(s): LIPASE, AMYLASE in the last 168 hours. No results for input(s): AMMONIA in the last 168 hours. Diabetic: No results for input(s): HGBA1C in the last 72 hours. Recent Labs  Lab 02/05/23 1826  GLUCAP 96   Cardiac Enzymes: Recent Labs  Lab 02/05/23 1826  CKTOTAL 515*   No results for input(s): PROBNP in the last 8760 hours. Coagulation Profile: No results for input(s): INR, PROTIME in the last 168 hours. Thyroid Function Tests: No results for input(s): TSH, T4TOTAL, FREET4, T3FREE, THYROIDAB in the last 72 hours. Lipid Profile: No results for input(s): CHOL, HDL, LDLCALC, TRIG, CHOLHDL, LDLDIRECT in the last 72 hours. Anemia Panel: No results for input(s): VITAMINB12, FOLATE, FERRITIN, TIBC, IRON, RETICCTPCT in the last 72 hours.  Urine analysis:    Component Value Date/Time   COLORURINE YELLOW 02/05/2023 2100   APPEARANCEUR HAZY (A) 02/05/2023 2100   LABSPEC 1.020 02/05/2023 2100   PHURINE 6.0 02/05/2023 2100   GLUCOSEU NEGATIVE 02/05/2023 2100   HGBUR  NEGATIVE 02/05/2023 2100   BILIRUBINUR NEGATIVE 02/05/2023 2100   KETONESUR NEGATIVE 02/05/2023 2100   PROTEINUR 30 (A) 02/05/2023 2100   NITRITE POSITIVE (A) 02/05/2023 2100   LEUKOCYTESUR LARGE (A) 02/05/2023 2100   Sepsis Labs: Invalid input(s): PROCALCITONIN, LACTICIDVEN  Microbiology: Recent Results (from the past 240 hours)  Urine Culture     Status: Abnormal   Collection Time: 02/05/23  9:00 PM   Specimen: Urine, Clean Catch  Result Value Ref Range Status   Specimen Description   Final    URINE, CLEAN CATCH Performed at [facility], [address], [place], [place] [address]    Special Requests   Final    NONE Performed at [facility], [address] [place], [place] [address]    Culture >=100,000 COLONIES/mL ESCHERICHIA COLI (A)  Final   Report Status 02/08/2023 FINAL  Final   Organism ID, Bacteria ESCHERICHIA COLI (A)  Final      Susceptibility   Escherichia coli - MIC*    AMPICILLIN <=2 SENSITIVE Sensitive  CEFAZOLIN  <=4 SENSITIVE Sensitive     CEFEPIME <=0.12 SENSITIVE Sensitive     CEFTRIAXONE  <=0.25 SENSITIVE Sensitive     CIPROFLOXACIN <=0.25 SENSITIVE Sensitive     GENTAMICIN <=1 SENSITIVE Sensitive     IMIPENEM <=0.25 SENSITIVE Sensitive     NITROFURANTOIN <=16 SENSITIVE Sensitive     TRIMETH/SULFA <=20 SENSITIVE Sensitive     AMPICILLIN/SULBACTAM <=2 SENSITIVE Sensitive     PIP/TAZO <=4 SENSITIVE Sensitive ug/mL    * >=100,000 COLONIES/mL ESCHERICHIA COLI    Radiology Studies: No results found.  Scheduled Meds:  acetaminophen   500 mg Oral Q6H   cefadroxil   500 mg Oral BID   feeding supplement  237 mL Oral BID BM   furosemide   40 mg Intravenous Daily   ketoconazole   1 Application Topical Once per day on Monday Thursday   metoprolol  succinate  25 mg Oral Daily   Rivaroxaban   15 mg Oral Daily   Continuous Infusions:     LOS: 6 days   35 minutes with more than 50% spent in reviewing records,  counseling patient/family and coordinating care.  Reyes VEAR Gaw, MD [facility] [email or web address] 02/11/2023, 3:19 PM

## 2023-02-12 DIAGNOSIS — R269 Unspecified abnormalities of gait and mobility: Secondary | ICD-10-CM | POA: Diagnosis not present

## 2023-02-12 DIAGNOSIS — Z7189 Other specified counseling: Secondary | ICD-10-CM

## 2023-02-12 DIAGNOSIS — M87052 Idiopathic aseptic necrosis of left femur: Secondary | ICD-10-CM | POA: Diagnosis not present

## 2023-02-12 DIAGNOSIS — W19XXXA Unspecified fall, initial encounter: Secondary | ICD-10-CM

## 2023-02-12 DIAGNOSIS — R52 Pain, unspecified: Secondary | ICD-10-CM

## 2023-02-12 DIAGNOSIS — N3000 Acute cystitis without hematuria: Secondary | ICD-10-CM | POA: Diagnosis not present

## 2023-02-12 DIAGNOSIS — Z79899 Other long term (current) drug therapy: Secondary | ICD-10-CM

## 2023-02-12 DIAGNOSIS — Z515 Encounter for palliative care: Secondary | ICD-10-CM

## 2023-02-12 DIAGNOSIS — H9190 Unspecified hearing loss, unspecified ear: Secondary | ICD-10-CM

## 2023-02-12 MED ORDER — ACETAMINOPHEN 500 MG PO TABS
1000.0000 mg | ORAL_TABLET | Freq: Three times a day (TID) | ORAL | Status: DC
Start: 2023-02-12 — End: 2023-02-15
  Administered 2023-02-12 – 2023-02-14 (×7): 1000 mg via ORAL
  Filled 2023-02-12 (×8): qty 2

## 2023-02-12 MED ORDER — GABAPENTIN 100 MG PO CAPS
100.0000 mg | ORAL_CAPSULE | Freq: Every day | ORAL | Status: DC
  Administered 2023-02-12: 100 mg via ORAL
  Filled 2023-02-12: qty 1

## 2023-02-12 NOTE — Progress Notes (Signed)
 Occupational Therapy Treatment Patient Details Name: Mary Knapp MRN: [id number] DOB: 08-May-1926 Today's Date: 02/12/2023   History of present illness Mary Knapp is a 89 y.o. female admitted from home with painful/edematous LEs, UTI and recent falls. On imaging noted AVN with partial collapse of L femoral head - orthopedic consult recommending THR - pt currently opposed to surgery but family will be discussing further 02/10/23 to come to decision. Pt with medical history significant for atrial fibrillation on Xarelto , hypertension, hyperlipidemia, carotid artery disease, degenerative scoliosis, and peripheral arterial disease.   OT comments  Pt making progress with functional goals. Pt's daughter present this session, very supportive. Pt reports that her knees ar more painful than the during sit - stand and standing to transfer to recliner. Pt seemed to be less anxious and more talkative and jovial this session. OT will continue to follow acutely to maximize level of function and safety O2 on 2L, but difficult to pick up, SATs reading 70's, placed probe on ear lobe and SATS reading  90%      If plan is discharge home, recommend the following:  A lot of help with bathing/dressing/bathroom;Two people to help with walking and/or transfers;Assistance with cooking/housework;Assist for transportation   Equipment Recommendations  Other (comment) (TBD at next venue of care)    Recommendations for Other Services      Precautions / Restrictions Precautions Precautions: Fall Restrictions Weight Bearing Restrictions Per Provider Order: No Other Position/Activity Restrictions: WBAT with RW and 2 person assist per ortho-consult       Mobility Bed Mobility Overal bed mobility: Needs Assistance Bed Mobility: Supine to Sit     Supine to sit: Min assist, Used rails     General bed mobility comments: increaesd time with steady assist and encouragement to EOB sitting    Transfers Overall  transfer level: Needs assistance Equipment used: Rolling walker (2 wheels) Transfers: Sit to/from Stand Sit to Stand: Mod assist, From elevated surface, +2 physical assistance, +2 safety/equipment           General transfer comment: multimodal  for hand placement on Rw, push up from bed. patient stands , much encouragement  to take small steps to recliner.     Balance Overall balance assessment: Needs assistance, History of Falls Sitting-balance support: Feet supported Sitting balance-Leahy Scale: Fair     Standing balance support: Bilateral upper extremity supported, During functional activity Standing balance-Leahy Scale: Poor                             ADL either performed or assessed with clinical judgement   ADL Overall ADL's : Needs assistance/impaired     Grooming: Wash/dry hands;Wash/dry face;Sitting;Supervision/safety           Upper Body Dressing : Contact guard assist;Sitting       Toilet Transfer: Moderate assistance;+2 for physical assistance;Cueing for safety;Cueing for sequencing;Stand-pivot;Rolling walker (2 wheels)   Toileting- Clothing Manipulation and Hygiene: Total assistance       Functional mobility during ADLs: Moderate assistance;+2 for physical assistance;Cueing for safety;Cueing for sequencing General ADL Comments: pt very fearful of falling and unable to fully extend knees    Extremity/Trunk Assessment Upper Extremity Assessment Upper Extremity Assessment: Generalized weakness   Lower Extremity Assessment Lower Extremity Assessment: Defer to PT evaluation   Cervical / Trunk Assessment Cervical / Trunk Assessment: Kyphotic    Vision Baseline Vision/History: 1 Wears glasses Ability to See in Adequate Light: 0  Adequate Patient Visual Report: No change from baseline     Perception     Praxis      Cognition Arousal: Alert Behavior During Therapy: WFL for tasks assessed/performed Overall Cognitive Status: Within  Functional Limits for tasks assessed                                 General Comments: very HOH, has B hearing aids, states her knees  hurt more        Exercises      Shoulder Instructions       General Comments      Pertinent Vitals/ Pain       Pain Assessment Pain Assessment: Faces Faces Pain Scale: Hurts even more Pain Location: both knees Pain Descriptors / Indicators: Grimacing, Guarding Pain Intervention(s): Monitored during session, Limited activity within patient's tolerance, Repositioned  Home Living                                          Prior Functioning/Environment              Frequency  Min 1X/week        Progress Toward Goals  OT Goals(current goals can now be found in the care plan section)  Progress towards OT goals: Progressing toward goals     Plan      Co-evaluation    PT/OT/SLP Co-Evaluation/Treatment: Yes Reason for Co-Treatment: For patient/therapist safety;To address functional/ADL transfers PT goals addressed during session: Mobility/safety with mobility OT goals addressed during session: ADL's and self-care;Proper use of Adaptive equipment and DME      AM-PAC OT "6 Clicks" Daily Activity     Outcome Measure   Help from another person eating meals?: None Help from another person taking care of personal grooming?: A Little Help from another person toileting, which includes using toliet, bedpan, or urinal?: Total Help from another person bathing (including washing, rinsing, drying)?: A Lot Help from another person to put on and taking off regular upper body clothing?: A Little Help from another person to put on and taking off regular lower body clothing?: Total 6 Click Score: 14    End of Session Equipment Utilized During Treatment: Gait belt;Rolling walker (2 wheels)  OT Visit Diagnosis: Unsteadiness on feet (R26.81);Other abnormalities of gait and mobility (R26.89);Muscle weakness  (generalized) (M62.81);History of falling (Z91.81)   Activity Tolerance Patient tolerated treatment well   Patient Left in chair;with call bell/phone within reach;with chair alarm set;with family/visitor present   Nurse Communication Mobility status        Time: 1005-1036 OT Time Calculation (min): 31 min  Charges: OT General Charges $OT Visit: 1 Visit OT Treatments $Self Care/Home Management : 8-22 mins    Alfred Ann 02/12/2023, 2:36 PM

## 2023-02-12 NOTE — Plan of Care (Signed)
  Problem: Clinical Measurements: Goal: Diagnostic test results will improve Outcome: Progressing   Problem: Nutrition: Goal: Adequate nutrition will be maintained Outcome: Progressing   Problem: Elimination: Goal: Will not experience complications related to urinary retention Outcome: Progressing   Problem: Pain Management: Goal: General experience of comfort will improve Outcome: Progressing   Problem: Safety: Goal: Ability to remain free from injury will improve Outcome: Progressing

## 2023-02-12 NOTE — Consult Note (Signed)
 Consultation Note Date: 02/12/2023   Patient Name: Mary Knapp  DOB: 1926/03/21  MRN: [id number]  Age / Sex: 89 y.o., female   PCP: Lysle Saunas, MD (Inactive) Referring Physician: Trenton Frock, MD  Reason for Consultation: Establishing goals of care     Chief Complaint/History of Present Illness:   Patient is a 89yo F with a PMHx of Afib on Xarelto , HTN, HLD, CAD, degenerative scoliosis, and PAD who was previously living independently at home and brought in by EMS on 02/04/22 for management of b/l leg pain and increasing falls at home.  Since admission, imaging failed potential AVN of hip.  Orthopedics consulted and recommended complete total hip replacement.  Cardiology also consulted with patient's known history of A-fib with possible surgery being explored.  Palliative medicine consulted to assist with complex medical decision making.  Extensive review of EMR prior to presenting to bedside.  Discussed care with hospitalist as well for updates.  When presenting to bedside initially, patient laying comfortably in bed with patient's daughter, Mary Knapp, present at bedside.  Physical therapy also present attempting to work with patient.  Patient able to work with physical therapy while this provider introduced palliative medicine and our role to patient's daughter.  Spent time learning extensively about patient's functional and medical status prior to admission.  Daughter describes that patient was living independently though she would keep a close eye on her via camera and home and sons were present and could assist if needed here in [place].  Mary Knapp lives in [place].  Patient will recently had been having bilateral lower extremity weakness secondary to pain.  Care That then patient had a fall at home and could not get up and so EMS was called.  [place] discussed that her original orthopedist had talked to family about potential total hip replacement for concerns about AVN of hip.  Family  considering second opinion. Spent time reviewing possible pathways for medical care moving forward.  If patient was truly functional prior to this hospitalization and has AVM, concerns of lack of surgery may lead to worsening functional status which could lead to further infections and end-of-life care.  Discussed should family want to focus on comfort, this would be an option as patient's mobility would worsen leading to deterioration.  During discussion patient herself participated with physical therapy and sat in bedside chair.  Patient admits she has fear of falling which makes her timid to stand.  Patient also acknowledges bilateral leg pain when standing though locates it more in her knees.  Patient describes this as a more chronic pain.  Patient herself voices she would like to eventually get back home and remain as functional as she can.  Patient was actually willing to consider surgery should that allow her pain relief and more function.  After extensive discussion, noted would reach out to Ortho about further recommendations.  During today able to discuss care with hospitalist, RN, and orthopedic including PA who disucess care with Dr. Charol Copas who is a hip specialist.  After review, concerned that this may have been an acute finding and patient is not suffering from weakness secondary to necrosis.  Orthopedics not recommending surgery at this time as is likely not an acute problem that needs fixing currently.  Orthopedics recommended patient could follow-up in the outpatient setting if she desired.  She would need total hip replacement if we wanted so would recommend rehab to regain strength in the setting of not needing surgery for AVN.  Presented to  bedside later in the afternoon to discuss care with patient.  Patient still sitting up in bedside chair.  Patient's son now present at bedside.  Again spent time explaining discussion with orthopedics throughout the day.  At this time it is not  recommended for patient to even undergo a surgery though this was original recommendation.  Currently recommendation is to go to rehab to regain strength.  Also going to adjust pain medications including adding gabapentin  100 mg nightly to hopefully help with neuropathic pain aspects.  Patient can admits she has lack of confidence and fear around falling so discussed importance of participating in rehab which patient is agreeing to.  Patient wants to regain her strength and become as functional as possible so she can return home to live independently.  Family supporting of this.  Also able to call patient's daughter, Mary Knapp, after discussion with patient and son.  Reviewed plan with Mary Knapp as well.  Planning on rehab with pain management at this time.  Discussed addition of gabapentin  at a minimal dose at night to help with aspects of neuropathic pain.  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  Mary Knapp inquiring about further imaging though expressed concern that further imaging would not change medical management plan though would inform Dr. Regan Cao of this question.  All questions answered at that time.  Noted palliative medicine team will continue to follow with patient's medical journey.  Primary Diagnoses  Present on Admission:  UTI (urinary tract infection)  PAF (paroxysmal atrial fibrillation) (HCC)  Falls   Palliative Review of Systems: weakness in legs  Past Medical History:  Diagnosis Date   Carotid artery disease (HCC)    nonobstructive   Chronic anticoagulation    H/O atrial flutter    Hyperlipidemia    Hypertension    PAF (paroxysmal atrial fibrillation) (HCC)    Valvular heart disease    Social History   Socioeconomic History   Marital status: Widowed    Spouse name: Not on file   Number of children: Not on file   Years of education: Not on file   Highest education level: Not on file  Occupational History   Not on file  Tobacco Use   Smoking status: Former     Types: Cigarettes   Smokeless tobacco: Never  Substance and Sexual Activity   Alcohol use: No   Drug use: No   Sexual activity: Not on file  Other Topics Concern   Not on file  Social History Narrative   Not on file   Social Drivers of Health   Financial Resource Strain: Not on file  Food Insecurity: No Food Insecurity (02/06/2023)   Hunger Vital Sign    Worried About Running Out of Food in the Last Year: Never true    Ran Out of Food in the Last Year: Never true  Transportation Needs: No Transportation Needs (02/06/2023)   PRAPARE - Administrator, Civil Service (Medical): No    Lack of Transportation (Non-Medical): No  Physical Activity: Not on file  Stress: Not on file  Social Connections: Moderately Integrated (02/06/2023)   Social Connection and Isolation Panel [NHANES]    Frequency of Communication with Friends and Family: More than three times a week    Frequency of Social Gatherings with Friends and Family: Twice a week    Attends Religious Services: More than 4 times per year    Active Member of [employer] or Organizations: Yes    Attends [employer]  or Organization Meetings: 1 to 4 times per year    Marital Status: Widowed   Family History  Problem Relation Age of Onset   Heart disease Father    Scheduled Meds:  acetaminophen   500 mg Oral Q6H   cefadroxil   500 mg Oral BID   feeding supplement  237 mL Oral BID BM   ketoconazole   1 Application Topical Once per day on Monday Thursday   metoprolol  succinate  25 mg Oral Daily   Rivaroxaban   15 mg Oral Daily   Continuous Infusions: PRN Meds:.acetaminophen  **OR** acetaminophen , bisacodyl , melatonin, ondansetron  **OR** ondansetron  (ZOFRAN ) IV Allergies  Allergen Reactions   Rosuvastatin  Other (See Comments)    Muscle pain   CBC:    Component Value Date/Time   WBC 9.2 02/08/2023 0917   HGB 11.2 (L) 02/08/2023 0917   HGB 14.1 08/13/2016 0000   HCT 33.6 (L) 02/08/2023 0917   HCT 43.5 08/13/2016 0000   PLT 188  02/08/2023 0917   PLT 215 08/13/2016 0000   MCV 98.2 02/08/2023 0917   MCV 95 08/13/2016 0000   NEUTROABS 4.1 08/13/2016 0000   LYMPHSABS 1.5 08/13/2016 0000   MONOABS 0.9 11/23/2012 0950   EOSABS 0.1 08/13/2016 0000   BASOSABS 0.0 08/13/2016 0000   Comprehensive Metabolic Panel:    Component Value Date/Time   NA 133 (L) 02/08/2023 0917   NA 138 04/08/2019 1426   K 4.3 02/08/2023 0917   CL 98 02/08/2023 0917   CO2 25 02/08/2023 0917   BUN 18 02/08/2023 0917   BUN 15 04/08/2019 1426   CREATININE 0.64 02/08/2023 0917   GLUCOSE 110 (H) 02/08/2023 0917   CALCIUM  9.2 02/08/2023 0917   AST 34 02/07/2023 0602   ALT 22 02/07/2023 0602   ALKPHOS 57 02/07/2023 0602   BILITOT 0.9 02/07/2023 0602   BILITOT 0.7 04/08/2019 1426   PROT 6.0 (L) 02/07/2023 0602   PROT 6.6 04/08/2019 1426   ALBUMIN 2.8 (L) 02/07/2023 0602   ALBUMIN 4.2 04/08/2019 1426    Physical Exam: Vital Signs: BP 136/62 (BP Location: Left Arm)   Pulse 88   Temp (!) 97.2 F (36.2 C) (Oral)   Resp 18   Ht 5\' 1"  (1.549 m)   Wt 56.7 kg   SpO2 90%   BMI 23.62 kg/m  SpO2: SpO2: 90 % O2 Device: O2 Device: Nasal Cannula O2 Flow Rate: O2 Flow Rate (L/min): 3 L/min Intake/output summary:  Intake/Output Summary (Last 24 hours) at 02/12/2023 0948 Last data filed at 02/11/2023 1500 Gross per 24 hour  Intake 237 ml  Output --  Net 237 ml   LBM: Last BM Date : 02/10/23 Baseline Weight: Weight: 56.7 kg Most recent weight: Weight: 56.7 kg  General: NAD, alert, sitting in bedside chair, elderly appearing as appropraite for age Eyes: wears glasses HENT: hard of hearing Cardiovascular: RRR Respiratory: no increased work of breathing noted, not in respiratory distress Neuro: A&Ox4, following commands easily (hard of hearing) Psych: appropriately answers all questions          Palliative Performance Scale: 40%              Additional Data Reviewed: No results for input(s): "WBC", "HGB", "PLT", "NA", "BUN",  "CREATININE" in the last 72 hours.  Invalid input(s): "ALB"  Imaging: ECHOCARDIOGRAM COMPLETE    ECHOCARDIOGRAM REPORT       Patient Name:   SHERISSA GRAVELY Date of Exam: 02/06/2023 Medical Rec #:  [id number]     Height:  61.0 in Accession #:    [id number]    Weight:       125.0 lb Date of Birth:  1926-08-01     BSA:          1.547 m Patient Age:    89 years      BP:           130/59 mmHg Patient Gender: F             HR:           68 bpm. Exam Location:  Inpatient  Procedure: 2D Echo, Color Doppler and Cardiac Doppler  Indications:    I48.91* Unspeicified atrial fibrillation   History:        Patient has no prior history of Echocardiogram examinations.                 CAD, Arrythmias:Atrial Fibrillation and Atrial Flutter; Risk                 Factors:Hypertension.   Sonographer:    Calleen Catena Referring Phys: [id number] CLAUDIA CLAIBORNE  IMPRESSIONS   1. Left ventricular ejection fraction, by estimation, is 70 to 75%. Left ventricular ejection fraction by 2D MOD biplane is 71.0 %. The left ventricle has hyperdynamic function. The left ventricle has no regional wall motion abnormalities. There is  moderate asymmetric left ventricular hypertrophy of the basal-septal segment. Left ventricular diastolic function could not be evaluated.  2. Right ventricular systolic function is low normal. The right ventricular size is normal. There is moderately elevated pulmonary artery systolic pressure. The estimated right ventricular systolic pressure is 59.4 mmHg.  3. Left atrial size was severely dilated.  4. Right atrial size was severely dilated.  5. The mitral valve is abnormal. Mild to moderate mitral valve regurgitation.  6. Leaflet thickening is noted. The tricuspid valve is abnormal. Tricuspid valve regurgitation is moderate to severe.  7. The aortic valve is tricuspid. Aortic valve regurgitation is trivial. Aortic valve sclerosis/calcification is present, without any evidence of  aortic stenosis. Aortic regurgitation PHT measures 484 msec.  8. The inferior vena cava is dilated in size with <50% respiratory variability, suggesting right atrial pressure of 15 mmHg.  Comparison(s): No prior Echocardiogram.  FINDINGS  Left Ventricle: Left ventricular ejection fraction, by estimation, is 70 to 75%. Left ventricular ejection fraction by 2D MOD biplane is 71.0 %. The left ventricle has hyperdynamic function. The left ventricle has no regional wall motion abnormalities.  The left ventricular internal cavity size was normal in size. There is moderate asymmetric left ventricular hypertrophy of the basal-septal segment. Left ventricular diastolic function could not be evaluated due to atrial fibrillation. Left ventricular  diastolic function could not be evaluated.  Right Ventricle: The right ventricular size is normal. No increase in right ventricular wall thickness. Right ventricular systolic function is low normal. There is moderately elevated pulmonary artery systolic pressure. The tricuspid regurgitant velocity  is 3.33 m/s, and with an assumed right atrial pressure of 15 mmHg, the estimated right ventricular systolic pressure is 59.4 mmHg.  Left Atrium: Left atrial size was severely dilated.  Right Atrium: Right atrial size was severely dilated.  Pericardium: There is no evidence of pericardial effusion.  Mitral Valve: The mitral valve is abnormal. There is mild thickening of the anterior and posterior mitral valve leaflet(s). There is mild calcification of the anterior and posterior mitral valve leaflet(s). Mild to moderate mitral valve regurgitation,  with centrally-directed jet.  Tricuspid Valve: Leaflet thickening is  noted. The tricuspid valve is abnormal. Tricuspid valve regurgitation is moderate to severe.  Aortic Valve: The aortic valve is tricuspid. Aortic valve regurgitation is trivial. Aortic regurgitation PHT measures 484 msec. Aortic valve  sclerosis/calcification is present, without any evidence of aortic stenosis.  Pulmonic Valve: The pulmonic valve was grossly normal. Pulmonic valve regurgitation is mild.  Aorta: The aortic root and ascending aorta are structurally normal, with no evidence of dilitation.  Venous: The inferior vena cava is dilated in size with less than 50% respiratory variability, suggesting right atrial pressure of 15 mmHg.  IAS/Shunts: No atrial level shunt detected by color flow Doppler.    LEFT VENTRICLE PLAX 2D                        Biplane EF (MOD) LVIDd:         4.10 cm         LV Biplane EF:   Left LVIDs:         2.20 cm                          ventricular LV PW:         0.90 cm                          ejection LV IVS:        1.20 cm                          fraction by LVOT diam:     1.80 cm                          2D MOD LV SV:         65                               biplane is LV SV Index:   42                               71.0 %. LVOT Area:     2.54 cm                                Diastology                                LV e' medial:    7.07 cm/s LV Volumes (MOD)               LV E/e' medial:  21.4 LV vol d, MOD    34.4 ml       LV e' lateral:   12.30 cm/s A2C:                           LV E/e' lateral: 12.3 LV vol d, MOD    44.1 ml A4C: LV vol s, MOD    9.9 ml A2C: LV vol s, MOD    12.3 ml A4C: LV SV MOD A2C:   24.5 ml LV SV MOD A4C:   44.1 ml LV SV MOD BP:  28.3 ml  RIGHT VENTRICLE             IVC RV Basal diam:  4.40 cm     IVC diam: 2.20 cm RV Mid diam:    2.50 cm RV S prime:     10.60 cm/s TAPSE (M-mode): 1.9 cm  LEFT ATRIUM             Index        RIGHT ATRIUM           Index LA diam:        5.10 cm 3.30 cm/m   RA Area:     27.00 cm LA Vol (A2C):   75.4 ml 48.75 ml/m  RA Volume:   85.20 ml  55.08 ml/m LA Vol (A4C):   79.6 ml 51.46 ml/m LA Biplane Vol: 77.1 ml 49.85 ml/m  AORTIC VALVE             PULMONIC VALVE LVOT Vmax:   112.00 cm/s PR End Diast  Vel: 1.40 msec LVOT Vmean:  79.600 cm/s LVOT VTI:    0.257 m AI PHT:      484 msec   AORTA Ao Root diam: 2.60 cm Ao Asc diam:  3.50 cm  MITRAL VALVE                TRICUSPID VALVE MV Area (PHT): 3.48 cm     TR Peak grad:   44.4 mmHg MV Decel Time: 218 msec     TR Vmax:        333.00 cm/s MR Peak grad: 96.4 mmHg MR Mean grad: 66.0 mmHg     SHUNTS MR Vmax:      491.00 cm/s   Systemic VTI:  0.26 m MR Vmean:     381.0 cm/s    Systemic Diam: 1.80 cm MV E velocity: 151.00 cm/s  Dinah Franco MD Electronically signed by Dinah Franco MD Signature Date/Time: 02/06/2023/11:09:26 AM      Final      I personally reviewed recent imaging.   Palliative Care Assessment and Plan Summary of Established Goals of Care and Medical Treatment Preferences   Patient is a 89yo F with a PMHx of Afib on Xarelto , HTN, HLD, CAD, degenerative scoliosis, and PAD who was previously living independently at home and brought in by EMS on 02/04/22 for management of b/l leg pain and increasing falls at home.  Since admission, imaging failed potential AVN of hip.  Orthopedics consulted and recommended complete total hip replacement.  Cardiology also consulted with patient's known history of A-fib with possible surgery being explored.  Palliative medicine consulted to assist with complex medical decision making.  # Complex medical decision making/goals of care  -Multiple conversations with patient and family members throughout the day.  After further discussions with hip specialist orthopedist, do not believe AVN present with need for surgery presently and would instead recommend rehab to regain strength.  Patient and family agreeing with patient going to rehab to regain strength as she was living independently at home before.  Patient hopes to remain as functional as long as she can and to return to her home to be independent.  -  Code Status: Limited: Do not attempt resuscitation (DNR) -DNR-LIMITED -Do Not Intubate/DNI     # Symptom management  -Pain   -Change Tylenol  to 1000 mg q8 hours during the day   -Start gabapentin  100 mg at bedtime   -Please do not use tramadol  for this patient with concerns regarding  multiple adverse effects in the geriatric population.  # Psycho-social/Spiritual Support:  - Support System: daughter, sons  # Discharge Planning:  Skilled Nursing Facility for rehab with Palliative care service follow-up if accepted  Thank you for allowing the palliative care team to participate in the care Kennth Peal.  Barnett Libel, DO Palliative Care Provider PMT # [phone]  If patient remains symptomatic despite maximum doses, please call PMT at [phone] between 0700 and 1900. Outside of these hours, please call attending, as PMT does not have night coverage.  Personally spent 97 minutes in patient care including extensive chart review (labs, imaging, progress/consult notes, vital signs), medically appropraite exam, discussed with treatment team, education to patient, family, and staff, documenting clinical information, medication review and management, coordination of care, and available advanced directive documents.   *Please note that this is a verbal dictation therefore any spelling or grammatical errors are due to the "[employer]" system interpretation.

## 2023-02-12 NOTE — Progress Notes (Signed)
 Physical Therapy Treatment Patient Details Name: Mary Knapp MRN: [id number] DOB: 1926/04/28 Today's Date: 02/12/2023   History of Present Illness Mary Knapp is a 89 y.o. female admitted from home with painful/edematous LEs, UTI and recent falls. On imaging noted AVN with partial collapse of L femoral head - orthopedic consult recommending THR - pt currently opposed to surgery but family will be discussing further 02/10/23 to come to decision. Pt with medical history significant for atrial fibrillation on Xarelto , hypertension, hyperlipidemia, carotid artery disease, degenerative scoliosis, and peripheral arterial disease.    PT Comments  The  patient is awake able to participate in mobility.  Patient indicating that her knees ar more painful than the hip when standing and taking steps to recliner. Patient will benefit from continued inpatient follow up therapy, <3 hours/day.  Spo2 on 2 L difficult to pick up, reading 70's, placed probe on ear lobe and  reading  90%   If plan is discharge home, recommend the following: Two people to help with walking and/or transfers;A lot of help with bathing/dressing/bathroom;Assistance with cooking/housework;Assist for transportation;Help with stairs or ramp for entrance   Can travel by private vehicle        Equipment Recommendations  None recommended by PT    Recommendations for Other Services       Precautions / Restrictions Precautions Precautions: Fall Restrictions Other Position/Activity Restrictions: WBAT with RW and 2 person assist per ortho-consult     Mobility  Bed Mobility Overal bed mobility: Needs Assistance Bed Mobility: Supine to Sit     Supine to sit: Min assist, Used rails     General bed mobility comments: increaed time with steady assist and encouragement to EOB sitting    Transfers Overall transfer level: Needs assistance Equipment used: Rolling walker (2 wheels) Transfers: Sit to/from Stand Sit to Stand: Mod  assist, From elevated surface, +2 physical assistance, +2 safety/equipment           General transfer comment: multimodal  for hand placement on Rw, push up from bed. patient stands , much encouragement  to take small steps to recliner.    Ambulation/Gait                   Stairs             Wheelchair Mobility     Tilt Bed    Modified Rankin (Stroke Patients Only)       Balance Overall balance assessment: Needs assistance, History of Falls Sitting-balance support: Feet supported Sitting balance-Leahy Scale: Fair     Standing balance support: Bilateral upper extremity supported, During functional activity Standing balance-Leahy Scale: Poor                              Cognition Arousal: Alert Behavior During Therapy: WFL for tasks assessed/performed Overall Cognitive Status: Within Functional Limits for tasks assessed                                 General Comments: very HOH, has B hearing aids, states her knees  hurt more        Exercises      General Comments        Pertinent Vitals/Pain Pain Assessment Faces Pain Scale: Hurts even more Pain Location: both knees Pain Descriptors / Indicators: Grimacing, Guarding Pain Intervention(s): Monitored during session    Home  Living                          Prior Function            PT Goals (current goals can now be found in the care plan section) Progress towards PT goals: Progressing toward goals    Frequency    Min 1X/week      PT Plan      Co-evaluation PT/OT/SLP Co-Evaluation/Treatment: Yes Reason for Co-Treatment: For patient/therapist safety;To address functional/ADL transfers PT goals addressed during session: Mobility/safety with mobility OT goals addressed during session: ADL's and self-care      AM-PAC PT "6 Clicks" Mobility   Outcome Measure  Help needed turning from your back to your side while in a flat bed without using  bedrails?: A Little Help needed moving from lying on your back to sitting on the side of a flat bed without using bedrails?: A Little Help needed moving to and from a bed to a chair (including a wheelchair)?: A Lot Help needed standing up from a chair using your arms (e.g., wheelchair or bedside chair)?: A Lot Help needed to walk in hospital room?: Total Help needed climbing 3-5 steps with a railing? : Total 6 Click Score: 12    End of Session Equipment Utilized During Treatment: Gait belt;Oxygen Activity Tolerance: Patient limited by fatigue;Patient tolerated treatment well Patient left: in bed;with call bell/phone within reach;with bed alarm set;with family/visitor present Nurse Communication: Mobility status PT Visit Diagnosis: Unsteadiness on feet (R26.81);Muscle weakness (generalized) (M62.81);History of falling (Z91.81);Difficulty in walking, not elsewhere classified (R26.2);Pain Pain - Right/Left: Left Pain - part of body: Knee     Time: 1005-1035 PT Time Calculation (min) (ACUTE ONLY): 30 min  Charges:    $Therapeutic Activity: 8-22 mins PT General Charges $$ ACUTE PT VISIT: 1 Visit                     Abelina Hoes PT Acute Rehabilitation Services Office [phone] Weekend [phone]    Dareen Ebbing 02/12/2023, 1:51 PM

## 2023-02-12 NOTE — Progress Notes (Signed)
 PROGRESS NOTE  Mary Knapp  WUJ:[id number] DOB: 07-25-1926 DOA: 02/05/2023 PCP: Lysle Saunas, MD (Inactive)  Consultants  Brief Narrative: Mary Knapp is a 89 y.o. functional female at baseline with medical history significant for atrial fibrillation on Xarelto , hypertension, hyperlipidemia, carotid artery disease, degenerative scoliosis, and peripheral arterial disease presents who presented with BL leg pain and increasing falls at home, though denied hitting her head.  In ED, CT head negative for bleed.  Found to have concern for AVN of pelvis and collapse of femoral head.  Has never been hospitalized previously.  Incidentally a UTI was also discovered with positive nitrates and leukocyte esterase.  Has Tylenol  scheduled for pain relief.  Orthopedics saw patient and recommended complete hip replacement.  Family is now interested palliative care   Assessment and Plan: AVN of hip: -Left sided with femoral head collapse. -Appreciate orthopedic consultation.  Recommends complex total hip replacement.   - APAP for pain relief which has been thus far controlling her pain. - Limit narcotics.   - Palliative consulted, appreciate input -Spoke with daughter and patient again today several times.  They are leaning towards rehab for strengthening and are still hesitant about putting her through a large surgery. -They would like to talk with an orthopedist outpatient if possible just to get a better understanding about what total hip replacement would mean  -They also acknowledged the potential plan of just going home with hospice and are still considering this but again leaning more towards rehab. -Daughter did say she would like a family meeting with palliative care including her brothers present to hear everything.  2.  Cards: -With known A-fib.  She is on Xarelto  and rate controlled without being on any oral rate control medications -Cards has deemed if she goes to surgery to be an acceptable risk  without need for any further imaging studies.   3.  Painful bilateral lower extremity edema:  - reason for presentation.  - checking B12--> within normal limits -Trial of gabapentin  today.   4.  Atrial fibrillation - Continue Xarelto .  Her beta-blocker had been held on admission but she has remained hypertensive while in house.  Will restart her home beta-blocker and await cardiology input. -No hypotension   5.  Hyponatremia: -Mild and asymptomatic. -Will continue to monitor.   6. Normocytic anemia: -No history of renal disease. -Denies any bleeding.        DVT prophylaxis:  Rivaroxaban  (XARELTO ) tablet 15 mg  Code Status:   Code Status: Limited: Do not attempt resuscitation (DNR) -DNR-LIMITED -Do Not Intubate/DNI  Family Communication: Spoke with patient's daughter by phone.   Level of care: Med-Surg Status is: Inpatient  Consults called: Orthopedics and cardiology, palliative care  Subjective: More awake and alert today.  Seated up in bed eating breakfast my first examination.  I also saw her again later in the afternoon as she was seated in a chair and seemed to be doing well..  Conversant.  Oriented.  No complaints or concerns except for the taste of her food.  Objective: Vitals:   02/11/23 1352 02/11/23 2116 02/12/23 0527 02/12/23 1417  BP: 123/61 127/64 136/62 (!) 126/58  Pulse: 73 82 88 (!) 119  Resp:  18 18 20   Temp: (!) 97.5 F (36.4 C) 99.1 F (37.3 C) (!) 97.2 F (36.2 C) 98.1 F (36.7 C)  TempSrc: Oral Oral Oral Oral  SpO2: 90% 90% 90% 98%  Weight:      Height:  No intake or output data in the 24 hours ending 02/12/23 1650  Filed Weights   02/05/23 1824  Weight: 56.7 kg   Body mass index is 23.62 kg/m.  Gen: 89 y.o. female in no apparent distress.  Nontoxic Pulm: Non-labored breathing.  Clear to auscultation bilaterally.  CV: Regular rate and rhythm. No murmur, rub, or gallop. No JVD GI: Abdomen soft, non-tender, non-distended, with  normoactive bowel sounds. No organomegaly or masses felt. Ext: Warm, no deformities, resolving BL LE edema.   MSK: Has notable bruise buttock/right gluteal region.  Tender to palpation here. Skin: No rashes, lesions Neuro: Alert and oriented. No focal neurological deficits. Psych: Calm  Judgement and insight appear normal. Mood & affect appropriate.     I have personally reviewed the following labs and images: CBC: Recent Labs  Lab 02/05/23 1826 02/06/23 0544 02/07/23 0602 02/08/23 0917  WBC 9.9 8.7 8.1 9.2  HGB 12.8 10.6* 11.1* 11.2*  HCT 38.7 32.5* 34.2* 33.6*  MCV 98.2 99.1 98.6 98.2  PLT 173 155 173 188   BMP &GFR Recent Labs  Lab 02/05/23 1826 02/06/23 0544 02/07/23 0602 02/08/23 0917  NA 132* 132* 131* 133*  K 4.2 3.8 3.9 4.3  CL 96* 97* 101 98  CO2 26 24 23 25   GLUCOSE 113* 116* 109* 110*  BUN 27* 26* 21 18  CREATININE 0.68 0.73 0.55 0.64  CALCIUM  9.7 9.1 9.0 9.2   Estimated Creatinine Clearance: 32.4 mL/min (by C-G formula based on SCr of 0.64 mg/dL). Liver & Pancreas: Recent Labs  Lab 02/05/23 1826 02/07/23 0602  AST 46* 34  ALT 26 22  ALKPHOS 67 57  BILITOT 1.3* 0.9  PROT 7.0 6.0*  ALBUMIN 3.4* 2.8*   No results for input(s): "LIPASE", "AMYLASE" in the last 168 hours. No results for input(s): "AMMONIA" in the last 168 hours. Diabetic: No results for input(s): "HGBA1C" in the last 72 hours. Recent Labs  Lab 02/05/23 1826  GLUCAP 96   Cardiac Enzymes: Recent Labs  Lab 02/05/23 1826  CKTOTAL 515*   No results for input(s): "PROBNP" in the last 8760 hours. Coagulation Profile: No results for input(s): "INR", "PROTIME" in the last 168 hours. Thyroid Function Tests: No results for input(s): "TSH", "T4TOTAL", "FREET4", "T3FREE", "THYROIDAB" in the last 72 hours. Lipid Profile: No results for input(s): "CHOL", "HDL", "LDLCALC", "TRIG", "CHOLHDL", "LDLDIRECT" in the last 72 hours. Anemia Panel: No results for input(s): "VITAMINB12", "FOLATE",  "FERRITIN", "TIBC", "IRON", "RETICCTPCT" in the last 72 hours.  Urine analysis:    Component Value Date/Time   COLORURINE YELLOW 02/05/2023 2100   APPEARANCEUR HAZY (A) 02/05/2023 2100   LABSPEC 1.020 02/05/2023 2100   PHURINE 6.0 02/05/2023 2100   GLUCOSEU NEGATIVE 02/05/2023 2100   HGBUR NEGATIVE 02/05/2023 2100   BILIRUBINUR NEGATIVE 02/05/2023 2100   KETONESUR NEGATIVE 02/05/2023 2100   PROTEINUR 30 (A) 02/05/2023 2100   NITRITE POSITIVE (A) 02/05/2023 2100   LEUKOCYTESUR LARGE (A) 02/05/2023 2100   Sepsis Labs: Invalid input(s): "PROCALCITONIN", "LACTICIDVEN"  Microbiology: Recent Results (from the past 240 hours)  Urine Culture     Status: Abnormal   Collection Time: 02/05/23  9:00 PM   Specimen: Urine, Clean Catch  Result Value Ref Range Status   Specimen Description   Final    URINE, CLEAN CATCH Performed at [facility], [address], [place], [place] [address]    Special Requests   Final    NONE Performed at [facility], [address] [place], [place] [address]    Culture >=100,000 COLONIES/mL ESCHERICHIA COLI (A)  Final   Report Status 02/08/2023 FINAL  Final   Organism ID, Bacteria ESCHERICHIA COLI (A)  Final      Susceptibility   Escherichia coli - MIC*    AMPICILLIN <=2 SENSITIVE Sensitive     CEFAZOLIN  <=4 SENSITIVE Sensitive     CEFEPIME <=0.12 SENSITIVE Sensitive     CEFTRIAXONE  <=0.25 SENSITIVE Sensitive     CIPROFLOXACIN <=0.25 SENSITIVE Sensitive     GENTAMICIN <=1 SENSITIVE Sensitive     IMIPENEM <=0.25 SENSITIVE Sensitive     NITROFURANTOIN <=16 SENSITIVE Sensitive     TRIMETH/SULFA <=20 SENSITIVE Sensitive     AMPICILLIN/SULBACTAM <=2 SENSITIVE Sensitive     PIP/TAZO <=4 SENSITIVE Sensitive ug/mL    * >=100,000 COLONIES/mL ESCHERICHIA COLI    Radiology Studies: No results found.  Scheduled Meds:  acetaminophen   500 mg Oral Q6H   cefadroxil   500 mg Oral BID   feeding supplement  237 mL Oral BID  BM   ketoconazole   1 Application Topical Once per day on Monday Thursday   metoprolol  succinate  25 mg Oral Daily   Rivaroxaban   15 mg Oral Daily   Continuous Infusions:     LOS: 7 days   35 minutes with more than 50% spent in reviewing records, counseling patient/family and coordinating care.  Trenton Frock, MD [facility] [email or web address] 02/12/2023, 4:50 PM

## 2023-02-13 DIAGNOSIS — M87052 Idiopathic aseptic necrosis of left femur: Secondary | ICD-10-CM | POA: Diagnosis not present

## 2023-02-13 DIAGNOSIS — E860 Dehydration: Secondary | ICD-10-CM

## 2023-02-13 DIAGNOSIS — N3000 Acute cystitis without hematuria: Secondary | ICD-10-CM | POA: Diagnosis not present

## 2023-02-13 DIAGNOSIS — Z515 Encounter for palliative care: Secondary | ICD-10-CM | POA: Diagnosis not present

## 2023-02-13 DIAGNOSIS — W19XXXA Unspecified fall, initial encounter: Secondary | ICD-10-CM | POA: Diagnosis not present

## 2023-02-13 NOTE — Plan of Care (Signed)

## 2023-02-13 NOTE — Progress Notes (Signed)
Mobility Specialist - Progress Note   02/13/23 1254  Mobility  Activity Transferred from bed to chair  Level of Assistance Maximum assist, patient does 25-49%  Assistive Device Stedy  Activity Response Tolerated well  Mobility Referral No  Mobility visit 1 Mobility  Mobility Specialist Start Time (ACUTE ONLY) 1228  Mobility Specialist Stop Time (ACUTE ONLY) 1245  Mobility Specialist Time Calculation (min) (ACUTE ONLY) 17 min   Pt received in bed and agreeable to transfer to the recliner. Pt was maxA from supine>sitting & STS. No complaints during session. Pt to recliner after session with all needs met.     [facility]

## 2023-02-13 NOTE — Progress Notes (Addendum)
PROGRESS NOTE  Mary Knapp  XNA:[id number] DOB: [birth year] DOA: 02/05/2023 PCP: Kirby Funk, MD (Inactive)  Consultants  Brief Narrative: Mary Knapp is a 89 y.o. functional female at baseline with medical history significant for atrial fibrillation on Xarelto, hypertension, hyperlipidemia, carotid artery disease, degenerative scoliosis, and peripheral arterial disease presents who presented with BL leg pain and increasing falls at home, though denied hitting her head.  In ED, CT head negative for bleed.  Found to have concern for AVN of pelvis and collapse of femoral head.  Has never been hospitalized previously.  Incidentally a UTI was also discovered with positive nitrates and leukocyte esterase.  Has Tylenol scheduled for pain relief.  Orthopedics saw patient and recommended complete hip replacement.  Family is now interested palliative care   Assessment and Plan: AVN of hip: -Left sided with femoral head collapse. -Appreciate orthopedic consultation.  Recommends complex total hip replacement.   - APAP for pain relief which has been thus far controlling her pain. - Limit narcotics.   - Palliative consulted, appreciate input - Want to do SNF w/ rehab and outpt Ortho follow-up for second opinion for hip repair.  2.  Cards: -With known A-fib.  She is on Xarelto and rate controlled without being on any oral rate control medications -Cards has deemed if she goes to surgery to be an acceptable risk without need for any further imaging studies.   3.  Painful bilateral lower extremity edema:  - reason for presentation.  - checking B12--> within normal limits -Trial of gabapentin yesterday resulted in patient being overly sleepy today.  Gabapentin discontinued.   4.  Atrial fibrillation - Continue Xarelto.  Her beta-blocker had been held on admission but she has remained hypertensive while in house.  Will restart her home beta-blocker and await cardiology input. -No hypotension   5.   Hyponatremia: -Mild and asymptomatic. -Will continue to monitor.   6. Normocytic anemia: -No history of renal disease. -Denies any bleeding.   7.  UTI: - completely resolved. -Finished 7-day course of antibiotics on 1/15.  She has done well off antibiotics       DVT prophylaxis:  Rivaroxaban (XARELTO) tablet 15 mg  Code Status:   Code Status: Limited: Do not attempt resuscitation (DNR) -DNR-LIMITED -Do Not Intubate/DNI  Family Communication: Spoke with patient's daughter by phone.   Level of care: Med-Surg Status is: Inpatient.  Plan for DC to SNF tomorrow if she is medically ready for discharge but the family had not chosen SNF.  Evidently they have chosen [facility] now.  Consults called: Orthopedics and cardiology, palliative care  Subjective: A little more sleepy this morning.  However she has no concerns.  She was not hungry for breakfast on my exam.  Reports that she is in no pain.  No abdominal pain.  No nausea.  Objective: Vitals:   02/12/23 1417 02/12/23 2014 02/13/23 0639 02/13/23 1541  BP: (!) 126/58 130/69 130/70 124/62  Pulse: (!) 119 71 85 71  Resp: 20 18 18    Temp: 98.1 F (36.7 C) 97.6 F (36.4 C) 98 F (36.7 C) 97.9 F (36.6 C)  TempSrc: Oral Oral Oral   SpO2: 98% 96% 90% 96%  Weight:      Height:        Intake/Output Summary (Last 24 hours) at 02/13/2023 1746 Last data filed at 02/13/2023 1716 Gross per 24 hour  Intake 353 ml  Output --  Net 353 ml    [employer]  02/05/23 1824  Weight: 56.7 kg   Body mass index is 23.62 kg/m.  Gen: 89 y.o. female in no apparent distress.  Nontoxic Pulm: Non-labored breathing.  Clear to auscultation bilaterally.  CV: Regular rate and rhythm. No murmur, rub, or gallop. No JVD GI: Abdomen soft, non-tender, non-distended, with normoactive bowel sounds. No organomegaly or masses felt. Ext: Warm, no deformities, resolving BL LE edema.   MSK: Has notable bruise buttock/right gluteal region.  Tender to  palpation here. Skin: No rashes, lesions Neuro: **Very hard of hearing.  Speech somewhat garbled because of this but intelligible.  Fully oriented. No focal neurological deficits. Psych: Calm  Judgement and insight appear normal. Mood & affect appropriate.     I have personally reviewed the following labs and images: CBC: Recent Labs  Lab 02/07/23 0602 02/08/23 0917  WBC 8.1 9.2  HGB 11.1* 11.2*  HCT 34.2* 33.6*  MCV 98.6 98.2  PLT 173 188   BMP &GFR Recent Labs  Lab 02/07/23 0602 02/08/23 0917  NA 131* 133*  K 3.9 4.3  CL 101 98  CO2 23 25  GLUCOSE 109* 110*  BUN 21 18  CREATININE 0.55 0.64  CALCIUM 9.0 9.2   Estimated Creatinine Clearance: 32.4 mL/min (by C-G formula based on SCr of 0.64 mg/dL). Liver & Pancreas: Recent Labs  Lab 02/07/23 0602  AST 34  ALT 22  ALKPHOS 57  BILITOT 0.9  PROT 6.0*  ALBUMIN 2.8*   No results for input(s): "LIPASE", "AMYLASE" in the last 168 hours. No results for input(s): "AMMONIA" in the last 168 hours. Diabetic: No results for input(s): "HGBA1C" in the last 72 hours. No results for input(s): "GLUCAP" in the last 168 hours.  Cardiac Enzymes: No results for input(s): "CKTOTAL", "CKMB", "CKMBINDEX", "TROPONINI" in the last 168 hours.  No results for input(s): "PROBNP" in the last 8760 hours. Coagulation Profile: No results for input(s): "INR", "PROTIME" in the last 168 hours. Thyroid Function Tests: No results for input(s): "TSH", "T4TOTAL", "FREET4", "T3FREE", "THYROIDAB" in the last 72 hours. Lipid Profile: No results for input(s): "CHOL", "HDL", "LDLCALC", "TRIG", "CHOLHDL", "LDLDIRECT" in the last 72 hours. Anemia Panel: No results for input(s): "VITAMINB12", "FOLATE", "FERRITIN", "TIBC", "IRON", "RETICCTPCT" in the last 72 hours.  Urine analysis:    Component Value Date/Time   COLORURINE YELLOW 02/05/2023 2100   APPEARANCEUR HAZY (A) 02/05/2023 2100   LABSPEC 1.020 02/05/2023 2100   PHURINE 6.0 02/05/2023 2100    GLUCOSEU NEGATIVE 02/05/2023 2100   HGBUR NEGATIVE 02/05/2023 2100   BILIRUBINUR NEGATIVE 02/05/2023 2100   KETONESUR NEGATIVE 02/05/2023 2100   PROTEINUR 30 (A) 02/05/2023 2100   NITRITE POSITIVE (A) 02/05/2023 2100   LEUKOCYTESUR LARGE (A) 02/05/2023 2100   Sepsis Labs: Invalid input(s): "PROCALCITONIN", "LACTICIDVEN"  Microbiology: Recent Results (from the past 240 hours)  Urine Culture     Status: Abnormal   Collection Time: 02/05/23  9:00 PM   Specimen: Urine, Clean Catch  Result Value Ref Range Status   Specimen Description   Final    URINE, CLEAN CATCH Performed at [facility], [address], [place], [place] [address]    Special Requests   Final    NONE Performed at [facility], [address], [place], [place] [address]    Culture >=100,000 COLONIES/mL ESCHERICHIA COLI (A)  Final   Report Status 02/08/2023 FINAL  Final   Organism ID, Bacteria ESCHERICHIA COLI (A)  Final      Susceptibility   Escherichia coli -  MIC*    AMPICILLIN <=2 SENSITIVE Sensitive     CEFAZOLIN <=4 SENSITIVE Sensitive     CEFEPIME <=0.12 SENSITIVE Sensitive     CEFTRIAXONE <=0.25 SENSITIVE Sensitive     CIPROFLOXACIN <=0.25 SENSITIVE Sensitive     GENTAMICIN <=1 SENSITIVE Sensitive     IMIPENEM <=0.25 SENSITIVE Sensitive     NITROFURANTOIN <=16 SENSITIVE Sensitive     TRIMETH/SULFA <=20 SENSITIVE Sensitive     AMPICILLIN/SULBACTAM <=2 SENSITIVE Sensitive     PIP/TAZO <=4 SENSITIVE Sensitive ug/mL    * >=100,000 COLONIES/mL ESCHERICHIA COLI    Radiology Studies: No results found.  Scheduled Meds:  acetaminophen  1,000 mg Oral Q8H   feeding supplement  237 mL Oral BID BM   ketoconazole  1 Application Topical Once per day on Monday Thursday   metoprolol succinate  25 mg Oral Daily   Rivaroxaban  15 mg Oral Daily   Continuous Infusions:     LOS: 8 days   35 minutes with more than 50% spent in reviewing records, counseling patient/family and  coordinating care.  Tobey Grim, MD [facility] [email or web address] 02/13/2023, 5:46 PM

## 2023-02-13 NOTE — Progress Notes (Signed)
Daily Progress Note   Patient Name: Mary Knapp       Date: 02/13/2023 DOB: 10/05/1926  Age: 89 y.o. MRN#: [id number] Attending Physician: Tobey Grim, MD Primary Care Physician: Kirby Funk, MD (Inactive) Admit Date: 02/05/2023 Length of Stay: 8 days  Reason for Consultation/Follow-up: Establishing goals of care  Subjective:   CC: Patient agreeing with pursuing rehab. Following up regarding complex medical decision making.   Subjective:  Reviewed EMR prior to presenting to bedside.   When presenting to bedside, patient laying comfortably in bed. Daughter, Mary Knapp, present at bedside. Discussed again plan for patient to go to rehab to regain strength. Patient hopefully to regain strength to return to her own home eventually.  Regarding pain, patient denied any currently. Daughter, Mary Knapp, did express concern patient was sleepy this morning. Though this could be contributed to by multiple factors, will discontinue gabapentin as to not confused the overall picture. Discussed will continue on scheduled Tylenol and can add lidocaine patches if needed.  Daughter planning to go look at possible rehab facility today. Discussed hope she will be able to go to rehab soon to regain strength.   Spent time proving emotional support via active listening. Noted palliative medicine team would continue to follow along with patient's medical journey.   Objective:   Vital Signs:  BP 130/70 (BP Location: Left Arm)   Pulse 85   Temp 98 F (36.7 C) (Oral)   Resp 18   Ht 5\' 1"  (1.549 m)   Wt 56.7 kg   SpO2 90%   BMI 23.62 kg/m   Physical Exam: General: NAD, alert, laying in bed, elderly appearing as appropraite for age Eyes: wears glasses HENT: hard of hearing Cardiovascular: RRR Respiratory: no increased work of breathing noted, not in respiratory distress Neuro: A&Ox4, following commands easily (hard of hearing) Psych: appropriately answers all questions   Imaging: I personally  reviewed recent imaging.   Assessment & Plan:   Assessment: Patient is a 89yo F with a PMHx of Afib on Xarelto, HTN, HLD, CAD, degenerative scoliosis, and PAD who was previously living independently at home and brought in by EMS on 02/04/22 for management of b/l leg pain and increasing falls at home.  Since admission, imaging failed potential AVN of hip.  Orthopedics consulted and recommended complete total hip replacement.  Cardiology also consulted with patient's known history of A-fib with possible surgery being explored.  Palliative medicine consulted to assist with complex medical decision making.   Recommendations/Plan: # Complex medical decision making/goals of care:  - Patient and family continuing to support patient going to rehab to regain strength. Patient hopes that by regaining strength she would be able to return home eventually as was living independently prior to hospitalization.  -After further discussions with hip specialist orthopedist on 1/15, do not believe AVN present with need for surgery presently and would instead recommend rehab to regain strength.                  -  Code Status: Limited: Do not attempt resuscitation (DNR) -DNR-LIMITED -Do Not Intubate/DNI     # Symptom management                -Pain                               -Change Tylenol to 1000 mg q8 hours during the day                               -  Discontinue  gabapentin 100 mg at bedtime. Patient's daughter noted slightly sleepy this morning so will discontinue.    -Consider topical lidocaine patches if needed.                                -Please do not use tramadol for this patient with concerns regarding multiple adverse effects in the geriatric population.   # Psycho-social/Spiritual Support:  - Support System: daughter, sons   # Discharge Planning:  Skilled Nursing Facility for rehab with Palliative care service follow-up if accepted  -Consulted TOC to assist with coordinating outpatient  palliative follow up  Discussed with: patient, daughter  Thank you for allowing the palliative care team to participate in the care Mary Knapp.  Alvester Morin, DO Palliative Care Provider PMT # [phone]  If patient remains symptomatic despite maximum doses, please call PMT at [phone] between 0700 and 1900. Outside of these hours, please call attending, as PMT does not have night coverage.

## 2023-02-14 ENCOUNTER — Inpatient Hospital Stay (HOSPITAL_COMMUNITY): Payer: Medicare Other

## 2023-02-14 DIAGNOSIS — N3000 Acute cystitis without hematuria: Secondary | ICD-10-CM | POA: Diagnosis not present

## 2023-02-14 DIAGNOSIS — M1610 Unilateral primary osteoarthritis, unspecified hip: Secondary | ICD-10-CM

## 2023-02-14 NOTE — Plan of Care (Signed)

## 2023-02-14 NOTE — Progress Notes (Signed)
PROGRESS NOTE  Mary Knapp  ZOX:[id number] DOB: Feb 09, 1926 DOA: 02/05/2023 PCP: Kirby Funk, MD (Inactive)  Consultants: cardiology, ortho, palliative  Brief Narrative: Mary Knapp is a 89 y.o. functional female at baseline with medical history significant for atrial fibrillation on Xarelto, hypertension, hyperlipidemia, CAD, degenerative scoliosis, and PAD who presented with BL leg pain and increasing falls at home.  In ED, CT head negative for bleed. Pelvis xray consistent with severe arthritis and AVN and collapse of left femoral head.  Orthopedics saw patient and recommended complete hip replacement.  Family is interested in conservative, palliative care   Assessment and Plan:  AVN of L hip: Pelvis xray consistent with severe arthritis and AVN and collapse of left femoral head. Ortho recommended hip replacement. Patient family elects conservative care with PT and analgesics and outpatient follow up.  PT evaluated and recommends SNF.  TOC engaged and awaiting disposition.  - analgesia PRN - bowel regimen  - palliative following  Hypoxia- patient has been on 2-3L [facility] for unknown duration this admission. I don't see any documentation of when or for what reason it was started nor any desats. Asked nurse to trial off and she had desaturation to 80s. Patient denies SOB or baseline oxygen use. Very difficult to get wave form on distal pulse ox so may be false low reading with poor circulation.  History of smoking.  - chest xray completed and I see no effusions or consolidations but radiology read is still pending.  - wean O2 as tolerated  Chronic A-fib-  She is on Xarelto and rate controlled without being on any oral rate control medications - cards risk assessment cleared for surgery if they choose to go through with procedure - continue xarelto - continue home metoprolol    Mild asymptomatic Hyponatremia: chronic. Na+ 133 at last check -Will continue to monitor.   Normocytic anemia:  stable. Hgb 11.2  UTI: Finished 7-day course of antibiotics on 1/15 urine culture positive for e coli     DVT prophylaxis:  Rivaroxaban (XARELTO) tablet 15 mg  Code Status:   Code Status: Limited: Do not attempt resuscitation (DNR) -DNR-LIMITED -Do Not Intubate/DNI  Family Communication: none at bedside  Level of care: Med-Surg Status is: Inpatient. Awaiting SNF placement  Consults called: Orthopedics and cardiology, palliative care  Subjective: Pt states that she is well at rest. Pain medications have been helping. She denies chest pain, SOB. Denies any history of oxygen use. She states she is going to [facility].   Objective: Vitals:   02/13/23 0639 02/13/23 1541 02/13/23 2233 02/14/23 0516  BP: 130/70 124/62 (!) 102/50 134/81  Pulse: 85 71 79   Resp: 18  20 20   Temp: 98 F (36.7 C) 97.9 F (36.6 C) (!) 97.5 F (36.4 C) 97.6 F (36.4 C)  TempSrc: Oral     SpO2: 90% 96% 91% 97%  Weight:      Height:        Intake/Output Summary (Last 24 hours) at 02/14/2023 0735 Last data filed at 02/13/2023 1716 Gross per 24 hour  Intake 353 ml  Output --  Net 353 ml    Filed Weights   02/05/23 1824  Weight: 56.7 kg   Body mass index is 23.62 kg/m.  Gen: 89 y.o. female in no apparent distress.  Nontoxic Pulm: Non-labored breathing.  Clear to auscultation bilaterally.  CV: Regular rate and rhythm. No murmur, rub, or gallop. No JVD GI: Abdomen soft, non-tender, non-distended, with normoactive bowel sounds. No  organomegaly or masses felt. Ext: Warm, no deformities, resolving BL LE edema.   MSK: Has notable bruise buttock/right gluteal region.  Tender to palpation here. Skin: No rashes, lesions Neuro: **Very hard of hearing.  Fully oriented. No focal neurological deficits. Psych: Calm  Judgement and insight appear normal. Mood & affect appropriate.    I have personally reviewed the following labs and images: CBC: Recent Labs  Lab 02/08/23 0917  WBC 9.2  HGB 11.2*  HCT  33.6*  MCV 98.2  PLT 188   BMP &GFR Recent Labs  Lab 02/08/23 0917  NA 133*  K 4.3  CL 98  CO2 25  GLUCOSE 110*  BUN 18  CREATININE 0.64  CALCIUM 9.2   Urine analysis:    Component Value Date/Time   COLORURINE YELLOW 02/05/2023 2100   APPEARANCEUR HAZY (A) 02/05/2023 2100   LABSPEC 1.020 02/05/2023 2100   PHURINE 6.0 02/05/2023 2100   GLUCOSEU NEGATIVE 02/05/2023 2100   HGBUR NEGATIVE 02/05/2023 2100   BILIRUBINUR NEGATIVE 02/05/2023 2100   KETONESUR NEGATIVE 02/05/2023 2100   PROTEINUR 30 (A) 02/05/2023 2100   NITRITE POSITIVE (A) 02/05/2023 2100   LEUKOCYTESUR LARGE (A) 02/05/2023 2100   Microbiology: Recent Results (from the past 240 hours)  Urine Culture     Status: Abnormal   Collection Time: 02/05/23  9:00 PM   Specimen: Urine, Clean Catch  Result Value Ref Range Status   Specimen Description   Final    URINE, CLEAN CATCH Performed at [facility], [address], [place], [place] [address]    Special Requests   Final    NONE Performed at [facility], [address], [place], [place] [address]    Culture >=100,000 COLONIES/mL ESCHERICHIA COLI (A)  Final   Report Status 02/08/2023 FINAL  Final   Organism ID, Bacteria ESCHERICHIA COLI (A)  Final      Susceptibility   Escherichia coli - MIC*    AMPICILLIN <=2 SENSITIVE Sensitive     CEFAZOLIN <=4 SENSITIVE Sensitive     CEFEPIME <=0.12 SENSITIVE Sensitive     CEFTRIAXONE <=0.25 SENSITIVE Sensitive     CIPROFLOXACIN <=0.25 SENSITIVE Sensitive     GENTAMICIN <=1 SENSITIVE Sensitive     IMIPENEM <=0.25 SENSITIVE Sensitive     NITROFURANTOIN <=16 SENSITIVE Sensitive     TRIMETH/SULFA <=20 SENSITIVE Sensitive     AMPICILLIN/SULBACTAM <=2 SENSITIVE Sensitive     PIP/TAZO <=4 SENSITIVE Sensitive ug/mL    * >=100,000 COLONIES/mL ESCHERICHIA COLI    Radiology Studies: No results found.  Scheduled Meds:  acetaminophen  1,000 mg Oral Q8H   feeding supplement  237 mL  Oral BID BM   ketoconazole  1 Application Topical Once per day on Monday Thursday   metoprolol succinate  25 mg Oral Daily   Rivaroxaban  15 mg Oral Daily   Continuous Infusions:     LOS: 9 days   35 minutes with more than 50% spent in reviewing records, counseling patient/family and coordinating care.  Leeroy Bock, MD [facility] [email or web address] 02/14/2023, 7:35 AM

## 2023-02-14 NOTE — TOC Progression Note (Addendum)
Transition of Care [facility]) - Progression Note    Patient Details  Name: Mary Knapp MRN: [id number] Date of Birth: 1927/01/01  [facility] CM/SW Contact  Armanda Heritage, RN Phone Number: 02/14/2023, 1:37 PM  Clinical Narrative:    CM met with patient and daughter Mary Knapp at bedside, confirmed SNF choice of [facility].  Spoke with facility rep Star who confirms a bed is available, insurance auth has been initiated with [employer] and is pending. Per Star, would need dc summary and auth by 3pm today in order to transfer patient, however can accept on Saturday if auth is received after 3pm.  For a weekend admission contact is Star at [phone].  Daughter Mary Knapp requests to be contacted at [phone] for updates. TOC will continue to follow.     Addendum: Noted TOC consult for outpatient palliative services, spoke with daughter Mary Knapp who had no preference for provider and is agreement to use agency preferred by [facility].  Spoke with rep Star, who confirms [facility] is agency used by facility.  Referral made to [facility] and daughter updated.   Expected Discharge Plan: Skilled Nursing Facility Barriers to Discharge: Continued Medical Work up  Expected Discharge Plan and Services In-house Referral: NA Discharge Planning Services: CM Consult Post Acute Care Choice: Skilled Nursing Facility Living arrangements for the past 2 months: Single Family Home                 DME Arranged: N/A DME Agency: NA       HH Arranged: NA HH Agency: NA         Social Determinants of Health (SDOH) Interventions SDOH Screenings   Food Insecurity: No Food Insecurity (02/06/2023)  Housing: Low Risk  (02/06/2023)  Transportation Needs: No Transportation Needs (02/06/2023)  Utilities: Not At Risk (02/06/2023)  Social Connections: Moderately Integrated (02/06/2023)  Tobacco Use: Medium Risk (02/05/2023)    Readmission Risk Interventions     No data to display

## 2023-02-14 NOTE — Plan of Care (Signed)

## 2023-02-14 NOTE — Progress Notes (Signed)
Physical Therapy Treatment Patient Details Name: Mary Knapp MRN: [id number] DOB: [birth year] Today's Date: 02/14/2023   History of Present Illness Mary Knapp is a 89 y.o. female admitted from home with painful/edematous LEs, UTI and recent falls. On imaging noted AVN with partial collapse of L femoral head - orthopedic consult recommending THR - pt currently opposed to surgery but family will be discussing further 02/10/23 to come to decision. Pt with medical history significant for atrial fibrillation on Xarelto, hypertension, hyperlipidemia, carotid artery disease, degenerative scoliosis, and peripheral arterial disease.    PT Comments  General Comments: very HOH, has B hearing aids, Ple[place] Lady AxO x 2, quick to say "I can't", nervous, fearful of falling.  Pt in bed on 2 lts oxygen sats 95%. Had to use EAR LOBE to get a reading. Trial RA decreased to 82% with activity.  SATURATION QUALIFICATIONS: (This note is used to comply with regulatory documentation for home oxygen)  Patient Saturations on Room Air at Rest = 85%  Patient Saturations on Room Air while Transferring/standing = 82%  Patient Saturations on 2 Liters of oxygen while Ambulating = 94%  Please briefly explain why patient needs home oxygen:  Pt requires supplemental oxygen to achieve therapeutic levels.  Assisted OOB to [facility] for a BM then back to bed required increased time and + 2 assist. Also required + 2 assist back to bed then position to comfort.  Pt will need ST Rehab at SNF to address mobility and functional decline prior to safely returning home.    If plan is discharge home, recommend the following: Two people to help with walking and/or transfers;A lot of help with bathing/dressing/bathroom;Assistance with cooking/housework;Assist for transportation;Help with stairs or ramp for entrance   Can travel by private vehicle     No  Equipment Recommendations  None recommended by PT    Recommendations for Other  Services       Precautions / Restrictions Precautions Precautions: Fall Precaution Comments: monitor sats Restrictions Weight Bearing Restrictions Per Provider Order: No Other Position/Activity Restrictions: WBAT with RW and 2 person assist per ortho-consult     Mobility  Bed Mobility Overal bed mobility: Needs Assistance Bed Mobility: Supine to Sit     Supine to sit: Mod assist Sit to supine: Max assist, +2 for physical assistance, +2 for safety/equipment   General bed mobility comments: required increased time and increased assist back to bed.  Greatest difficulty self scooting.  Weak.    Transfers Overall transfer level: Needs assistance Equipment used: Rolling walker (2 wheels) Transfers: Sit to/from Stand Sit to Stand: Max assist, +2 physical assistance, +2 safety/equipment           General transfer comment: assisted from elevated bed to [facility] and back to bed 1/4 turn pivot with walker + 2 assist.  Very unsteady.  Nervous.  Fearful of falling.  Limited partial upright stance ability due to B knee pain/weakness.  B hips/knees remained flexed.  Turns are incomplete.  Therapist supporting buttocks to complete pivot.  HIGH FALL RISK.    Ambulation/Gait               General Gait Details: transfer only this session due to weakness/fear/anxiety   Stairs             Wheelchair Mobility     Tilt Bed    Modified Rankin (Stroke Patients Only)       Balance  Cognition Arousal: Alert Behavior During Therapy: WFL for tasks assessed/performed Overall Cognitive Status: Within Functional Limits for tasks assessed                                 General Comments: very HOH, has B hearing aids, [place] AxO x 2, quick to say "I can't", nervous, fearful of falling        Exercises      General Comments        Pertinent Vitals/Pain Pain Assessment Pain Assessment:  Faces Faces Pain Scale: Hurts little more Pain Location: both knees Pain Descriptors / Indicators: Grimacing, Guarding Pain Intervention(s): Monitored during session    Home Living                          Prior Function            PT Goals (current goals can now be found in the care plan section) Progress towards PT goals: Progressing toward goals    Frequency    Min 1X/week      PT Plan      Co-evaluation              AM-PAC PT "6 Clicks" Mobility   Outcome Measure  Help needed turning from your back to your side while in a flat bed without using bedrails?: A Lot Help needed moving from lying on your back to sitting on the side of a flat bed without using bedrails?: A Lot Help needed moving to and from a bed to a chair (including a wheelchair)?: A Lot Help needed standing up from a chair using your arms (e.g., wheelchair or bedside chair)?: A Lot Help needed to walk in hospital room?: Total Help needed climbing 3-5 steps with a railing? : Total 6 Click Score: 10    End of Session Equipment Utilized During Treatment: Gait belt;Oxygen Activity Tolerance: Patient limited by fatigue;Patient tolerated treatment well Patient left: in bed;with call bell/phone within reach;with bed alarm set;with family/visitor present Nurse Communication: Mobility status PT Visit Diagnosis: Unsteadiness on feet (R26.81);Muscle weakness (generalized) (M62.81);History of falling (Z91.81);Difficulty in walking, not elsewhere classified (R26.2);Pain     Time: 1450-1515 PT Time Calculation (min) (ACUTE ONLY): 25 min  Charges:    $Therapeutic Activity: 23-37 mins PT General Charges $$ ACUTE PT VISIT: 1 Visit                     {Jadie Allington  PTA Acute  Rehabilitation Services Office M-F          [phone]

## 2023-02-14 NOTE — Progress Notes (Signed)
[facility] Liaison Note:   (new referral for outpatient palliative services)  Notified by [facility] Sharol Roussel, RN, of patient/family request for [facility] Palliative Care services at [facility].    Referral submitted today.    Please call with any hospice or outpatient palliative care related questions.  Thank you for the opportunity to participate in this patient's care.  Redge Gainer, [facility] Liaison [phone]

## 2023-02-15 MED ORDER — FUROSEMIDE 10 MG/ML IJ SOLN: 20.0000 mg | Freq: Once | INTRAMUSCULAR | Status: DC

## 2023-03-01 NOTE — Progress Notes (Signed)
When I walked to the room patient's family was at bedside and were grieving.  Patient had passed away around 5:50 AM.  She was DNR/DNI. I answered any additional questions that patient's family had. Nursing staff to make arrangements.  Attending Physician Prior to 7am: Dr Karsten Fells.   Stephania Fragmin MD

## 2023-03-01 NOTE — Death Summary Note (Signed)
DEATH SUMMARY   Patient Details  Name: Mary Knapp MRN: [id number] DOB: Apr 28, 1926 DGL:OVFIEPP, Mary Ruiz, MD (Inactive) Admission/Discharge Information   Admit Date:  2023-02-06  Date of Death: Date of Death: 02-16-2023  Time of Death: Time of Death: 0550  Length of Stay: 10   Principle Cause of death: unknown  Hospital Diagnoses: Principal Problem:   UTI (urinary tract infection) Active Problems:   PAF (paroxysmal atrial fibrillation) (HCC)   Gait abnormality   Hearing loss   Falls   Chronic anticoagulation   Avascular necrosis of bone of left hip (HCC)   Tricuspid valve insufficiency   Palliative care encounter   Fall   Pain   Medication management   Counseling and coordination of care   Goals of care, counseling/discussion   Dehydration  Hospital Course: Mary Knapp was a 89 y.o. functional female at baseline with medical history significant for atrial fibrillation on Xarelto, hypertension, hyperlipidemia, CAD, degenerative scoliosis, and PAD who presented with BL leg pain and increasing falls at home.  In ED, CT head negative for bleed. Pelvis xray consistent with severe arthritis and AVN and collapse of left femoral head.  Orthopedics saw patient and recommended complete hip replacement.  Family is interested in conservative, palliative care.  She was awaiting discharge to SNF and in stable condition.  She passed away in the early hours of 02/16/2023 with family at bedside. There is no documentation from the covering provider or nursing staff describing the events. The most recent vitals showed O2 saturation of 86% at 21:05 so may have had respiratory decline undocumented from that point.   Procedures: none   Consultations: ortho   The results of significant diagnostics from this hospitalization (including imaging, microbiology, ancillary and laboratory) are listed below for reference.   Significant Diagnostic Studies: DG CHEST PORT 1 VIEW Result Date:  02/14/2023 CLINICAL DATA:  Hypoxia EXAM: PORTABLE CHEST 1 VIEW COMPARISON:  02/06/23 FINDINGS: Cardiomegaly. Diffuse interstitial prominence and airspace disease, right greater than left. No visible effusions or pneumothorax. No acute bony abnormality. Aortic atherosclerosis. IMPRESSION: Cardiomegaly. Diffuse interstitial prominence. Bilateral airspace disease, right greater than left. Findings could reflect asymmetric edema or infection. Electronically Signed   By: Charlett Nose M.D.   On: 02/14/2023 19:07   ECHOCARDIOGRAM COMPLETE Result Date: 02/06/2023    ECHOCARDIOGRAM REPORT   Patient Name:   [name] Date of Exam: 02/06/2023 Medical Rec #:  [id number]     Height:       61.0 in Accession #:    [id number]    Weight:       125.0 lb Date of Birth:  1926-08-03     BSA:          1.547 m Patient Age:    89 years      BP:           130/59 mmHg Patient Gender: F             HR:           68 bpm. Exam Location:  Inpatient Procedure: 2D Echo, Color Doppler and Cardiac Doppler Indications:    I48.91* Unspeicified atrial fibrillation  History:        Patient has no prior history of Echocardiogram examinations.                 CAD, Arrythmias:Atrial Fibrillation and Atrial Flutter; Risk                 Factors:Hypertension.  Sonographer:    Webb Laws Referring Phys: [id number] CLAUDIA CLAIBORNE IMPRESSIONS  1. Left ventricular ejection fraction, by estimation, is 70 to 75%. Left ventricular ejection fraction by 2D MOD biplane is 71.0 %. The left ventricle has hyperdynamic function. The left ventricle has no regional wall motion abnormalities. There is moderate asymmetric left ventricular hypertrophy of the basal-septal segment. Left ventricular diastolic function could not be evaluated.  2. Right ventricular systolic function is low normal. The right ventricular size is normal. There is moderately elevated pulmonary artery systolic pressure. The estimated right ventricular systolic pressure is 59.4 mmHg.  3. Left  atrial size was severely dilated.  4. Right atrial size was severely dilated.  5. The mitral valve is abnormal. Mild to moderate mitral valve regurgitation.  6. Leaflet thickening is noted. The tricuspid valve is abnormal. Tricuspid valve regurgitation is moderate to severe.  7. The aortic valve is tricuspid. Aortic valve regurgitation is trivial. Aortic valve sclerosis/calcification is present, without any evidence of aortic stenosis. Aortic regurgitation PHT measures 484 msec.  8. The inferior vena cava is dilated in size with <50% respiratory variability, suggesting right atrial pressure of 15 mmHg. Comparison(s): No prior Echocardiogram. FINDINGS  Left Ventricle: Left ventricular ejection fraction, by estimation, is 70 to 75%. Left ventricular ejection fraction by 2D MOD biplane is 71.0 %. The left ventricle has hyperdynamic function. The left ventricle has no regional wall motion abnormalities. The left ventricular internal cavity size was normal in size. There is moderate asymmetric left ventricular hypertrophy of the basal-septal segment. Left ventricular diastolic function could not be evaluated due to atrial fibrillation. Left ventricular diastolic function could not be evaluated. Right Ventricle: The right ventricular size is normal. No increase in right ventricular wall thickness. Right ventricular systolic function is low normal. There is moderately elevated pulmonary artery systolic pressure. The tricuspid regurgitant velocity  is 3.33 m/s, and with an assumed right atrial pressure of 15 mmHg, the estimated right ventricular systolic pressure is 59.4 mmHg. Left Atrium: Left atrial size was severely dilated. Right Atrium: Right atrial size was severely dilated. Pericardium: There is no evidence of pericardial effusion. Mitral Valve: The mitral valve is abnormal. There is mild thickening of the anterior and posterior mitral valve leaflet(s). There is mild calcification of the anterior and posterior mitral  valve leaflet(s). Mild to moderate mitral valve regurgitation, with centrally-directed jet. Tricuspid Valve: Leaflet thickening is noted. The tricuspid valve is abnormal. Tricuspid valve regurgitation is moderate to severe. Aortic Valve: The aortic valve is tricuspid. Aortic valve regurgitation is trivial. Aortic regurgitation PHT measures 484 msec. Aortic valve sclerosis/calcification is present, without any evidence of aortic stenosis. Pulmonic Valve: The pulmonic valve was grossly normal. Pulmonic valve regurgitation is mild. Aorta: The aortic root and ascending aorta are structurally normal, with no evidence of dilitation. Venous: The inferior vena cava is dilated in size with less than 50% respiratory variability, suggesting right atrial pressure of 15 mmHg. IAS/Shunts: No atrial level shunt detected by color flow Doppler.  LEFT VENTRICLE PLAX 2D                        Biplane EF (MOD) LVIDd:         4.10 cm         LV Biplane EF:   Left LVIDs:         2.20 cm  ventricular LV PW:         0.90 cm                          ejection LV IVS:        1.20 cm                          fraction by LVOT diam:     1.80 cm                          2D MOD LV SV:         65                               biplane is LV SV Index:   42                               71.0 %. LVOT Area:     2.54 cm                                Diastology                                LV e' medial:    7.07 cm/s LV Volumes (MOD)               LV E/e' medial:  21.4 LV vol d, MOD    34.4 ml       LV e' lateral:   12.30 cm/s A2C:                           LV E/e' lateral: 12.3 LV vol d, MOD    44.1 ml A4C: LV vol s, MOD    9.9 ml A2C: LV vol s, MOD    12.3 ml A4C: LV SV MOD A2C:   24.5 ml LV SV MOD A4C:   44.1 ml LV SV MOD BP:    28.3 ml RIGHT VENTRICLE             IVC RV Basal diam:  4.40 cm     IVC diam: 2.20 cm RV Mid diam:    2.50 cm RV S prime:     10.60 cm/s TAPSE (M-mode): 1.9 cm LEFT ATRIUM             Index         RIGHT ATRIUM           Index LA diam:        5.10 cm 3.30 cm/m   RA Area:     27.00 cm LA Vol (A2C):   75.4 ml 48.75 ml/m  RA Volume:   85.20 ml  55.08 ml/m LA Vol (A4C):   79.6 ml 51.46 ml/m LA Biplane Vol: 77.1 ml 49.85 ml/m  AORTIC VALVE             PULMONIC VALVE LVOT Vmax:   112.00 cm/s PR End Diast Vel: 1.40 msec LVOT Vmean:  79.600 cm/s LVOT VTI:    0.257 m AI PHT:      484 msec  AORTA Ao Root  diam: 2.60 cm Ao Asc diam:  3.50 cm MITRAL VALVE                TRICUSPID VALVE MV Area (PHT): 3.48 cm     TR Peak grad:   44.4 mmHg MV Decel Time: 218 msec     TR Vmax:        333.00 cm/s MR Peak grad: 96.4 mmHg MR Mean grad: 66.0 mmHg     SHUNTS MR Vmax:      491.00 cm/s   Systemic VTI:  0.26 m MR Vmean:     381.0 cm/s    Systemic Diam: 1.80 cm MV E velocity: 151.00 cm/s Zoila Shutter MD Electronically signed by Zoila Shutter MD Signature Date/Time: 02/06/2023/11:09:26 AM    Final    CT Head Wo Contrast Result Date: 02/05/2023 CLINICAL DATA:  Head trauma fell out of chair EXAM: CT HEAD WITHOUT CONTRAST TECHNIQUE: Contiguous axial images were obtained from the base of the skull through the vertex without intravenous contrast. RADIATION DOSE REDUCTION: This exam was performed according to the departmental dose-optimization program which includes automated exposure control, adjustment of the mA and/or kV according to patient size and/or use of iterative reconstruction technique. COMPARISON:  None Available. FINDINGS: Brain: No acute territorial infarction, hemorrhage or intracranial mass. Moderate atrophy and chronic small vessel ischemic changes of the white matter. Prominent ventricles felt secondary to atrophy. Vascular: No hyperdense vessels.  Carotid vascular calcification Skull: Normal. Negative for fracture or focal lesion. Sinuses/Orbits: No acute finding. Other: None IMPRESSION: 1. No CT evidence for acute intracranial abnormality. 2. Moderate atrophy and chronic small vessel ischemic changes of the white  matter. Electronically Signed   By: Jasmine Pang M.D.   On: 02/05/2023 22:27   DG Chest Port 1 View Result Date: 02/05/2023 CLINICAL DATA:  Fall EXAM: PORTABLE CHEST 1 VIEW COMPARISON:  None Available. FINDINGS: Mild diffuse reticular interstitial opacity suspicious for underlying chronic lung disease. Possible ground-glass infiltrate at left base. Upper normal cardiac silhouette with aortic atherosclerosis. No pneumothorax IMPRESSION: Mild diffuse reticular interstitial opacity suspicious for underlying chronic lung disease. Possible ground-glass infiltrate at left base. Electronically Signed   By: Jasmine Pang M.D.   On: 02/05/2023 21:21   DG Pelvis 1-2 Views Result Date: 02/05/2023 CLINICAL DATA:  Fall EXAM: PELVIS - 1-2 VIEW COMPARISON:  CT 05/02/2019 FINDINGS: SI joints are non widened. Pubic symphysis and rami appear intact. Interval severe arthritis of the left hip with bone on bone appearance and subarticular sclerosis. Sclerosis and lucency in the left femoral head with deformity, suspicious for AVN and partial collapse. IMPRESSION: 1. No acute osseous abnormality. 2. Interval severe arthritis of the left hip with findings suspicious for AVN and partial collapse of the left femoral head. Electronically Signed   By: Jasmine Pang M.D.   On: 02/05/2023 21:20   DG Lumbar Spine 2-3 Views Result Date: 02/05/2023 CLINICAL DATA:  Fall EXAM: LUMBAR SPINE - 2-3 VIEW COMPARISON:  None Available. FINDINGS: Trace anterolisthesis L2 on L3, L3 on L4 and L4 on L5. Vertebral body heights are maintained. Moderate severe diffuse degenerative changes with multilevel disc space narrowing and osteophyte. Multiple level facet degenerative change. Aortic atherosclerosis IMPRESSION: Moderate to severe diffuse degenerative changes. Electronically Signed   By: Jasmine Pang M.D.   On: 02/05/2023 21:13    Microbiology: No results found for this or any previous visit (from the past 240 hours).  Time spent: 25  minutes  Signed: Leeroy Bock, MD 02/16/2023

## 2023-03-01 NOTE — Progress Notes (Signed)
    Patient Name: Mary Knapp           DOB: [birth year]  MRN: [id number]       OVERNIGHT EVENT    Notified by RN that patient has expired at 0550.  2 RN verified. Patient was DNR/DNI, with plan for [facility] Pall[facility] services at [facility] .   Family has been notified.    Anthoney Harada, DNP, ACNPC- AG [facility]

## 2023-03-01 NOTE — Plan of Care (Signed)
  Problem: Clinical Measurements: Goal: Will remain free from infection Outcome: Progressing Goal: Diagnostic test results will improve Outcome: Progressing   Problem: Pain Management: Goal: General experience of comfort will improve Outcome: Progressing   Problem: Safety: Goal: Ability to remain free from injury will improve Outcome: Progressing   Problem: Skin Integrity: Goal: Risk for impaired skin integrity will decrease Outcome: Progressing

## 2023-03-01 NOTE — Hospital Course (Signed)
Brief Narrative:  89 y.o. functional female at baseline with medical history significant for atrial fibrillation on Xarelto, hypertension, hyperlipidemia, CAD, degenerative scoliosis, and PAD who presented with BL leg pain and increasing falls at home.  In ED, CT head negative for bleed. Pelvis xray consistent with severe arthritis and AVN and collapse of left femoral head.  Orthopedics saw patient and recommended complete hip replacement.  Family is interested in conservative, palliative care.  At this time currently we are awaiting SNF placement.   Assessment & Plan:  Principal Problem:   UTI (urinary tract infection) Active Problems:   PAF (paroxysmal atrial fibrillation) (HCC)   Gait abnormality   Hearing loss   Falls   Chronic anticoagulation   Avascular necrosis of bone of left hip (HCC)   Tricuspid valve insufficiency   Palliative care encounter   Fall   Pain   Medication management   Counseling and coordination of care   Goals of care, counseling/discussion   Dehydration     AVN of L hip: Pelvis xray consistent with severe arthritis and AVN and collapse of left femoral head. Ortho recommended hip replacement. Patient family elects conservative care with PT and analgesics and outpatient follow up.  PT has recommended SNF.  TOC working on his. PT evaluated and recommends SNF.  In the meantime pain control and as needed bowel regimen.   Hypoxia- Possibly secondary to mild edema or atelectasis.  Encourage mobility/incentive spirometer.  BNP slightly elevated, will give gentle Lasix   Chronic A-fib-  Currently decided against surgery.  Continue her home medication including Xarelto   Mild asymptomatic Hyponatremia: chronic. Na+ 133 at last check -Will continue to monitor.   Normocytic anemia: stable. Hgb 11.2   UTI:  Finished 7-day course of antibiotics on 1/15 urine culture positive for e coli  DVT prophylaxis:  Rivaroxaban (XARELTO) tablet 15 mg  Code Status:   Code  Status: Limited: Do not attempt resuscitation (DNR) -DNR-LIMITED -Do Not Intubate/DNI  Family Communication: none at bedside  Level of care: Med-Surg Status is: Inpatient. Awaiting SNF placement   Consults called: Orthopedics and cardiology, palliative care    Subjective:    Examination:  General exam: Appears calm and comfortable  Respiratory system: Clear to auscultation. Respiratory effort normal. Cardiovascular system: S1 & S2 heard, RRR. No JVD, murmurs, rubs, gallops or clicks. No pedal edema. Gastrointestinal system: Abdomen is nondistended, soft and nontender. No organomegaly or masses felt. Normal bowel sounds heard. Central nervous system: Alert and oriented. No focal neurological deficits. Extremities: Symmetric 5 x 5 power. Skin: No rashes, lesions or ulcers Psychiatry: Judgement and insight appear normal. Mood & affect appropriate.

## 2023-03-01 DEATH — deceased

## 2023-03-03 ENCOUNTER — Ambulatory Visit: Payer: Medicare Other | Admitting: Nurse Practitioner
